# Patient Record
Sex: Male | Born: 1955
Health system: Southern US, Community
[De-identification: ages and names within clinical notes are randomized; demographics above are authoritative.]

## PROBLEM LIST (undated history)

## (undated) DIAGNOSIS — Z720 Tobacco use: Secondary | ICD-10-CM

## (undated) DIAGNOSIS — F1910 Other psychoactive substance abuse, uncomplicated: Secondary | ICD-10-CM

## (undated) DIAGNOSIS — M1990 Unspecified osteoarthritis, unspecified site: Secondary | ICD-10-CM

## (undated) DIAGNOSIS — I10 Essential (primary) hypertension: Secondary | ICD-10-CM

## (undated) DIAGNOSIS — J189 Pneumonia, unspecified organism: Secondary | ICD-10-CM

## (undated) DIAGNOSIS — I252 Old myocardial infarction: Secondary | ICD-10-CM

## (undated) DIAGNOSIS — F32A Depression, unspecified: Secondary | ICD-10-CM

## (undated) DIAGNOSIS — E119 Type 2 diabetes mellitus without complications: Secondary | ICD-10-CM

## (undated) DIAGNOSIS — R079 Chest pain, unspecified: Secondary | ICD-10-CM

## (undated) DIAGNOSIS — R7303 Prediabetes: Secondary | ICD-10-CM

## (undated) DIAGNOSIS — F329 Major depressive disorder, single episode, unspecified: Secondary | ICD-10-CM

## (undated) DIAGNOSIS — I639 Cerebral infarction, unspecified: Secondary | ICD-10-CM

## (undated) DIAGNOSIS — F1010 Alcohol abuse, uncomplicated: Secondary | ICD-10-CM

## (undated) DIAGNOSIS — J449 Chronic obstructive pulmonary disease, unspecified: Secondary | ICD-10-CM

## (undated) DIAGNOSIS — I82409 Acute embolism and thrombosis of unspecified deep veins of unspecified lower extremity: Secondary | ICD-10-CM

## (undated) DIAGNOSIS — K219 Gastro-esophageal reflux disease without esophagitis: Secondary | ICD-10-CM

## (undated) HISTORY — DX: Acute embolism and thrombosis of unspecified deep veins of unspecified lower extremity: I82.409

## (undated) HISTORY — PX: GANGLION CYST EXCISION: SHX1691

## (undated) HISTORY — DX: Gastro-esophageal reflux disease without esophagitis: K21.9

## (undated) HISTORY — DX: Major depressive disorder, single episode, unspecified: F32.9

## (undated) HISTORY — DX: Chronic obstructive pulmonary disease, unspecified: J44.9

## (undated) HISTORY — DX: Depression, unspecified: F32.A

## (undated) HISTORY — DX: Chest pain, unspecified: R07.9

## (undated) HISTORY — DX: Unspecified osteoarthritis, unspecified site: M19.90

## (undated) HISTORY — PX: WISDOM TOOTH EXTRACTION: SHX21

## (undated) HISTORY — PX: BACK SURGERY: SHX140

## (undated) HISTORY — DX: Other psychoactive substance abuse, uncomplicated: F19.10

## (undated) HISTORY — DX: Essential (primary) hypertension: I10

---

## 1998-10-26 HISTORY — PX: GANGLION CYST EXCISION: SHX1691

## 2004-09-12 ENCOUNTER — Emergency Department: Payer: Self-pay | Admitting: Emergency Medicine

## 2005-03-03 ENCOUNTER — Emergency Department: Payer: Self-pay | Admitting: Emergency Medicine

## 2005-03-12 ENCOUNTER — Emergency Department: Payer: Self-pay | Admitting: Unknown Physician Specialty

## 2005-11-30 ENCOUNTER — Inpatient Hospital Stay: Payer: Self-pay | Admitting: Internal Medicine

## 2005-11-30 ENCOUNTER — Other Ambulatory Visit: Payer: Self-pay

## 2005-12-01 ENCOUNTER — Other Ambulatory Visit: Payer: Self-pay

## 2006-03-01 ENCOUNTER — Emergency Department: Payer: Self-pay | Admitting: Emergency Medicine

## 2006-04-28 ENCOUNTER — Emergency Department: Payer: Self-pay | Admitting: Emergency Medicine

## 2006-05-03 ENCOUNTER — Emergency Department: Payer: Self-pay | Admitting: Emergency Medicine

## 2006-05-05 ENCOUNTER — Emergency Department: Payer: Self-pay | Admitting: Emergency Medicine

## 2006-05-12 ENCOUNTER — Ambulatory Visit: Payer: Self-pay | Admitting: Unknown Physician Specialty

## 2006-05-12 ENCOUNTER — Emergency Department: Payer: Self-pay | Admitting: Emergency Medicine

## 2006-10-26 DIAGNOSIS — I82409 Acute embolism and thrombosis of unspecified deep veins of unspecified lower extremity: Secondary | ICD-10-CM

## 2006-10-26 HISTORY — DX: Acute embolism and thrombosis of unspecified deep veins of unspecified lower extremity: I82.409

## 2006-11-10 ENCOUNTER — Inpatient Hospital Stay: Payer: Self-pay | Admitting: Internal Medicine

## 2006-11-10 ENCOUNTER — Other Ambulatory Visit: Payer: Self-pay

## 2006-12-01 ENCOUNTER — Emergency Department: Payer: Self-pay | Admitting: Emergency Medicine

## 2007-01-27 ENCOUNTER — Emergency Department: Payer: Self-pay | Admitting: Internal Medicine

## 2007-01-30 ENCOUNTER — Inpatient Hospital Stay: Payer: Self-pay | Admitting: Internal Medicine

## 2007-02-06 ENCOUNTER — Emergency Department: Payer: Self-pay | Admitting: Emergency Medicine

## 2007-03-25 ENCOUNTER — Encounter: Admission: RE | Admit: 2007-03-25 | Discharge: 2007-03-25 | Payer: Self-pay | Admitting: Neurosurgery

## 2007-05-27 ENCOUNTER — Emergency Department: Payer: Self-pay | Admitting: Emergency Medicine

## 2007-06-27 HISTORY — PX: LUMBAR DISC SURGERY: SHX700

## 2007-06-30 ENCOUNTER — Ambulatory Visit (HOSPITAL_COMMUNITY): Admission: RE | Admit: 2007-06-30 | Discharge: 2007-07-01 | Payer: Self-pay | Admitting: Neurosurgery

## 2008-01-17 ENCOUNTER — Ambulatory Visit: Payer: Self-pay | Admitting: Family Medicine

## 2008-03-02 ENCOUNTER — Other Ambulatory Visit: Payer: Self-pay

## 2008-03-02 ENCOUNTER — Emergency Department: Payer: Self-pay | Admitting: Emergency Medicine

## 2008-05-21 ENCOUNTER — Ambulatory Visit: Payer: Self-pay | Admitting: Family Medicine

## 2008-09-28 ENCOUNTER — Emergency Department: Payer: Self-pay | Admitting: Emergency Medicine

## 2009-09-25 ENCOUNTER — Ambulatory Visit: Payer: Self-pay | Admitting: Oncology

## 2009-09-26 ENCOUNTER — Emergency Department: Payer: Self-pay | Admitting: Internal Medicine

## 2009-11-26 ENCOUNTER — Ambulatory Visit: Payer: Self-pay | Admitting: Oncology

## 2010-02-05 ENCOUNTER — Ambulatory Visit: Payer: Self-pay | Admitting: Podiatry

## 2010-02-10 ENCOUNTER — Ambulatory Visit: Payer: Self-pay | Admitting: Cardiology

## 2010-06-02 ENCOUNTER — Ambulatory Visit: Payer: Self-pay | Admitting: Family Medicine

## 2010-07-09 ENCOUNTER — Encounter: Payer: Self-pay | Admitting: Family Medicine

## 2010-07-26 ENCOUNTER — Encounter: Payer: Self-pay | Admitting: Family Medicine

## 2010-08-20 ENCOUNTER — Ambulatory Visit: Payer: Self-pay | Admitting: Pain Medicine

## 2010-08-27 ENCOUNTER — Inpatient Hospital Stay: Payer: Self-pay | Admitting: *Deleted

## 2010-09-27 ENCOUNTER — Emergency Department: Payer: Self-pay | Admitting: Emergency Medicine

## 2010-11-21 ENCOUNTER — Emergency Department: Payer: Self-pay | Admitting: Emergency Medicine

## 2010-12-04 ENCOUNTER — Emergency Department: Payer: Self-pay | Admitting: Emergency Medicine

## 2011-01-22 ENCOUNTER — Ambulatory Visit: Payer: Self-pay | Admitting: Pain Medicine

## 2011-01-24 ENCOUNTER — Inpatient Hospital Stay: Payer: Self-pay | Admitting: Internal Medicine

## 2011-03-10 NOTE — Op Note (Signed)
NAME:  Brent Byrd, Brent Byrd                ACCOUNT NO.:  [id number]   MEDICAL RECORD NO.:  [id number]          PATIENT TYPE:  AMB   LOCATION:  SDS                          FACILITY:  [facility]   PHYSICIAN:  Reinaldo Meeker, M.D. DATE OF BIRTH:  11/02/1955   DATE OF PROCEDURE:  06/30/2007  DATE OF DISCHARGE:                               OPERATIVE REPORT   PREOPERATIVE DIAGNOSIS:  Herniated disk L4-5, L5-S1 right.   POSTOPERATIVE DIAGNOSIS:  Herniated disk L4-5, L5-S1 right.   PROCEDURE:  Right L4-5, L5-S1 interlaminar laminotomy for excision of  herniated disk with operative microscope.   SECONDARY PROCEDURE:  Microsection L4-5 disk and L5-S1 disk and L5-S1  nerve roots.   SURGEON:  Reinaldo Meeker, M.D.   ASSISTANT:  Dr. Marikay Alar.   PROCEDURE IN DETAIL:  After being placed in the prone position, the  patient's back was prepped and draped in the usual sterile fashion.  Localizing x-rays taken prior to incision to identify the appropriate  level.  Midline incision was made over the spinous processes of L4-5,  S1.  Using Bovie cutting current incision was carried down spinous  processes.  Subperiosteal dissection was then carried out on the right-  side of the spinous processes and lamina and self-retaining retractor  was placed for exposure.  X-ray showed approach to the appropriate  level.  Starting at L4-5, a high-speed drill was used on the right side  to remove the inferior one third of the L4 lamina, medial third of facet  joint and the superior one third of the L5 lamina.  Residual bone  ligamentum flavum removed in a piecemeal fashion.  Similar decompression  was then carried out at L5-S1 once again removing the inferior one third  of the L5-9 lamina, the medial third of the facet joint and the superior  one third of the S1 lamina.  Residual bone and ligamentum flavum removed  at this level.  At this time, the microscope was draped brought into the  field and used for the  remainder of the case.  Starting at L5-S1  microsection technique was used to identify the S1 nerve root and L5-S1  disk.  Disk was incised with a 15 blade and thoroughly cleaned out with  pituitary rongeurs and curettes.  At this time, inspection was carried  out for evidence of residual compression at this level and none could be  identified.  Attention was turned to L4-5.  Once again microsection  technique was used to identify the lateral aspect of thecal sac, the S1  nerve root and the L4-5 disk which was once again found to be simply  herniated.  After coagulating on the annulus, the annulus was once again  incised with 15 blade.  Using pituitary rongeurs and curettes once again  the disk space was thoroughly cleaned out, while at the same time, great  care was taken to avoid injury to the neural elements.  This was  successfully done.  At this time, inspection was carried out in all  directions for any evidence of residual compression of both levels and  none could be identified.  Large amounts of irrigation were carried out.  Any  bleeding controlled with bipolar coagulation.  The wound was then closed  in multiple layers of Vicryl in the muscle fascia, subcutaneous,  subcuticular tissues and staples were placed on the skin.  A sterile  dressings was then applied and the patient was extubated and taken to  the recovery room in stable condition.           ______________________________  Reinaldo Meeker, M.D.     ROK/MEDQ  D:  06/30/2007  T:  06/30/2007  Job:  [id number]

## 2011-06-19 ENCOUNTER — Inpatient Hospital Stay: Payer: Self-pay | Admitting: Internal Medicine

## 2011-08-07 LAB — BASIC METABOLIC PANEL
BUN: 10
Chloride: 104
GFR calc non Af Amer: >60
Glucose, Bld: 79
Potassium: 3.6
Sodium: 136

## 2011-08-07 LAB — CBC
HCT: 39.8
Hemoglobin: 13.3
MCV: 94.1
Platelets: 207
RDW: 14
WBC: 5.3

## 2011-08-07 LAB — APTT: aPTT: 55 — ABNORMAL HIGH

## 2011-12-01 ENCOUNTER — Emergency Department: Payer: Self-pay | Admitting: Emergency Medicine

## 2012-01-25 DIAGNOSIS — I639 Cerebral infarction, unspecified: Secondary | ICD-10-CM

## 2012-01-25 HISTORY — DX: Cerebral infarction, unspecified: I63.9

## 2012-02-17 LAB — BASIC METABOLIC PANEL
Anion Gap: 11 (ref 7–16)
BUN: 12 mg/dL (ref 7–18)
Creatinine: 1.06 mg/dL (ref 0.60–1.30)
EGFR (African American): >60
Glucose: 100 mg/dL — ABNORMAL HIGH (ref 65–99)

## 2012-02-17 LAB — CBC
HCT: 42.1 % (ref 40.0–52.0)
MCH: 31.9 pg (ref 26.0–34.0)
Platelet: 161 10*3/uL (ref 150–440)
RBC: 4.39 10*6/uL — ABNORMAL LOW (ref 4.40–5.90)

## 2012-02-18 ENCOUNTER — Inpatient Hospital Stay: Payer: Self-pay | Admitting: Internal Medicine

## 2012-02-18 LAB — LIPID PANEL
Cholesterol: 164 mg/dL (ref 0–200)
HDL Cholesterol: 79 mg/dL — ABNORMAL HIGH (ref 40–60)
Triglycerides: 78 mg/dL (ref 0–200)
VLDL Cholesterol, Calc: 16 mg/dL (ref 5–40)

## 2012-02-18 LAB — TROPONIN I: Troponin-I: <0.02 ng/mL

## 2012-07-11 ENCOUNTER — Emergency Department: Payer: Self-pay | Admitting: Emergency Medicine

## 2012-07-26 HISTORY — PX: HAMMER TOE SURGERY: SHX385

## 2012-08-01 ENCOUNTER — Ambulatory Visit: Payer: Self-pay | Admitting: Podiatry

## 2012-08-01 LAB — CBC WITH DIFFERENTIAL/PLATELET
Basophil %: 1 %
Eosinophil #: 0.2 10*3/uL (ref 0.0–0.7)
HCT: 42.2 % (ref 40.0–52.0)
HGB: 14.1 g/dL (ref 13.0–18.0)
MCH: 31.9 pg (ref 26.0–34.0)
MCHC: 33.4 g/dL (ref 32.0–36.0)
MCV: 96 fL (ref 80–100)
Monocyte #: 0.4 x10 3/mm (ref 0.2–1.0)
Neutrophil #: 2.2 10*3/uL (ref 1.4–6.5)

## 2012-08-05 ENCOUNTER — Ambulatory Visit: Payer: Self-pay | Admitting: Podiatry

## 2012-08-17 ENCOUNTER — Other Ambulatory Visit: Payer: Self-pay | Admitting: Pain Medicine

## 2012-08-17 ENCOUNTER — Ambulatory Visit: Payer: Self-pay | Admitting: Pain Medicine

## 2012-08-17 LAB — CBC WITH DIFFERENTIAL/PLATELET
Basophil #: 0.1 10*3/uL (ref 0.0–0.1)
Eosinophil #: 0.2 10*3/uL (ref 0.0–0.7)
HCT: 40.1 % (ref 40.0–52.0)
HGB: 13.7 g/dL (ref 13.0–18.0)
Lymphocyte %: 43.6 %
MCHC: 34.1 g/dL (ref 32.0–36.0)
Monocyte %: 11.1 %
Neutrophil #: 1.4 10*3/uL (ref 1.4–6.5)
Neutrophil %: 36.8 %
RDW: 13.9 % (ref 11.5–14.5)
WBC: 3.7 10*3/uL — ABNORMAL LOW (ref 3.8–10.6)

## 2012-08-19 ENCOUNTER — Ambulatory Visit: Payer: Self-pay | Admitting: Pain Medicine

## 2012-10-26 DIAGNOSIS — K219 Gastro-esophageal reflux disease without esophagitis: Secondary | ICD-10-CM

## 2012-10-26 HISTORY — DX: Gastro-esophageal reflux disease without esophagitis: K21.9

## 2012-12-26 ENCOUNTER — Emergency Department: Payer: Self-pay | Admitting: Emergency Medicine

## 2013-01-03 ENCOUNTER — Emergency Department: Payer: Self-pay | Admitting: Emergency Medicine

## 2013-01-03 LAB — URINALYSIS, COMPLETE
Bacteria: NONE SEEN
Bilirubin,UR: NEGATIVE
Glucose,UR: NEGATIVE mg/dL (ref 0–75)
Nitrite: NEGATIVE
Protein: NEGATIVE
Specific Gravity: 1.025 (ref 1.003–1.030)
Squamous Epithelial: <1

## 2013-01-03 LAB — COMPREHENSIVE METABOLIC PANEL
Anion Gap: 3 — ABNORMAL LOW (ref 7–16)
Co2: 27 mmol/L (ref 21–32)
EGFR (Non-African Amer.): >60
SGOT(AST): 32 U/L (ref 15–37)
SGPT (ALT): 48 U/L (ref 12–78)
Sodium: 139 mmol/L (ref 136–145)
Total Protein: 8 g/dL (ref 6.4–8.2)

## 2013-01-03 LAB — CBC
HCT: 44.3 % (ref 40.0–52.0)
MCHC: 32.7 g/dL (ref 32.0–36.0)
MCV: 95 fL (ref 80–100)
WBC: 4.8 10*3/uL (ref 3.8–10.6)

## 2013-02-24 ENCOUNTER — Inpatient Hospital Stay: Payer: Self-pay | Admitting: Internal Medicine

## 2013-02-24 LAB — TROPONIN I: Troponin-I: <0.02 ng/mL

## 2013-02-24 LAB — COMPREHENSIVE METABOLIC PANEL
Albumin: 4 g/dL (ref 3.4–5.0)
Alkaline Phosphatase: 98 U/L (ref 50–136)
BUN: 6 mg/dL — ABNORMAL LOW (ref 7–18)
Bilirubin,Total: 0.5 mg/dL (ref 0.2–1.0)
Creatinine: 0.75 mg/dL (ref 0.60–1.30)
EGFR (African American): >60
EGFR (Non-African Amer.): >60
Potassium: 3.6 mmol/L (ref 3.5–5.1)
SGOT(AST): 29 U/L (ref 15–37)

## 2013-02-24 LAB — CBC
HCT: 44.1 % (ref 40.0–52.0)
HGB: 14.5 g/dL (ref 13.0–18.0)
MCHC: 32.9 g/dL (ref 32.0–36.0)
Platelet: 215 10*3/uL (ref 150–440)
RBC: 4.73 10*6/uL (ref 4.40–5.90)
WBC: 8.7 10*3/uL (ref 3.8–10.6)

## 2013-02-24 LAB — PROTIME-INR
INR: 1
Prothrombin Time: 13.3 secs (ref 11.5–14.7)

## 2013-02-24 LAB — DRUG SCREEN, URINE
Amphetamines, Ur Screen: NEGATIVE (ref ?–1000)
Benzodiazepine, Ur Scrn: NEGATIVE (ref ?–200)
Cannabinoid 50 Ng, Ur ~~LOC~~: NEGATIVE (ref ?–50)
Cocaine Metabolite,Ur ~~LOC~~: POSITIVE (ref ?–300)
MDMA (Ecstasy)Ur Screen: NEGATIVE (ref ?–500)
Opiate, Ur Screen: NEGATIVE (ref ?–300)
Phencyclidine (PCP) Ur S: NEGATIVE (ref ?–25)

## 2013-02-24 LAB — ETHANOL: Ethanol: 137 mg/dL

## 2013-02-25 LAB — CBC WITH DIFFERENTIAL/PLATELET
Basophil %: 0.7 %
Eosinophil #: 0.1 10*3/uL (ref 0.0–0.7)
Eosinophil %: 1.9 %
HCT: 40.6 % (ref 40.0–52.0)
HGB: 13.7 g/dL (ref 13.0–18.0)
MCH: 31.2 pg (ref 26.0–34.0)
MCV: 93 fL (ref 80–100)
Monocyte #: 0.7 x10 3/mm (ref 0.2–1.0)
Monocyte %: 9.2 %
Neutrophil #: 4.8 10*3/uL (ref 1.4–6.5)
Neutrophil %: 66.2 %

## 2013-02-25 LAB — BASIC METABOLIC PANEL
Anion Gap: 4 — ABNORMAL LOW (ref 7–16)
BUN: 9 mg/dL (ref 7–18)
Co2: 24 mmol/L (ref 21–32)
Creatinine: 0.98 mg/dL (ref 0.60–1.30)
Osmolality: 270 (ref 275–301)
Potassium: 3.9 mmol/L (ref 3.5–5.1)
Sodium: 136 mmol/L (ref 136–145)

## 2013-02-25 LAB — CK TOTAL AND CKMB (NOT AT ARMC)
CK, Total: 394 U/L — ABNORMAL HIGH (ref 35–232)
CK-MB: 2.2 ng/mL (ref 0.5–3.6)

## 2013-02-26 DIAGNOSIS — R0602 Shortness of breath: Secondary | ICD-10-CM

## 2013-03-02 LAB — CULTURE, BLOOD (SINGLE)

## 2013-05-31 ENCOUNTER — Inpatient Hospital Stay: Payer: Self-pay | Admitting: Psychiatry

## 2013-05-31 LAB — URINALYSIS, COMPLETE
Bacteria: NONE SEEN
Bilirubin,UR: NEGATIVE
Ketone: NEGATIVE
Nitrite: NEGATIVE
Ph: 5 (ref 4.5–8.0)
Protein: NEGATIVE
Specific Gravity: 1.011 (ref 1.003–1.030)
Squamous Epithelial: 5
WBC UR: 12 /HPF (ref 0–5)

## 2013-05-31 LAB — COMPREHENSIVE METABOLIC PANEL
Anion Gap: 6 — ABNORMAL LOW (ref 7–16)
Calcium, Total: 8.7 mg/dL (ref 8.5–10.1)
Chloride: 109 mmol/L — ABNORMAL HIGH (ref 98–107)
Co2: 25 mmol/L (ref 21–32)
Creatinine: 0.99 mg/dL (ref 0.60–1.30)
EGFR (African American): >60
Glucose: 77 mg/dL (ref 65–99)
Osmolality: 276 (ref 275–301)
Potassium: 4.1 mmol/L (ref 3.5–5.1)
SGOT(AST): 46 U/L — ABNORMAL HIGH (ref 15–37)
SGPT (ALT): 54 U/L (ref 12–78)
Sodium: 140 mmol/L (ref 136–145)

## 2013-05-31 LAB — DRUG SCREEN, URINE
Amphetamines, Ur Screen: NEGATIVE (ref ?–1000)
Benzodiazepine, Ur Scrn: NEGATIVE (ref ?–200)
Cannabinoid 50 Ng, Ur ~~LOC~~: NEGATIVE (ref ?–50)
Cocaine Metabolite,Ur ~~LOC~~: POSITIVE (ref ?–300)
MDMA (Ecstasy)Ur Screen: NEGATIVE (ref ?–500)
Methadone, Ur Screen: NEGATIVE (ref ?–300)
Phencyclidine (PCP) Ur S: NEGATIVE (ref ?–25)
Tricyclic, Ur Screen: NEGATIVE (ref ?–1000)

## 2013-05-31 LAB — CBC
HGB: 14.2 g/dL (ref 13.0–18.0)
MCH: 32.6 pg (ref 26.0–34.0)
MCHC: 33.8 g/dL (ref 32.0–36.0)
RBC: 4.37 10*6/uL — ABNORMAL LOW (ref 4.40–5.90)
RDW: 16.2 % — ABNORMAL HIGH (ref 11.5–14.5)
WBC: 4 10*3/uL (ref 3.8–10.6)

## 2013-05-31 LAB — TSH: Thyroid Stimulating Horm: 0.798 u[IU]/mL

## 2013-07-29 ENCOUNTER — Inpatient Hospital Stay: Payer: Self-pay | Admitting: Internal Medicine

## 2013-07-29 LAB — TROPONIN I
Troponin-I: <0.02 ng/mL
Troponin-I: <0.02 ng/mL

## 2013-07-29 LAB — CBC
MCH: 33 pg (ref 26.0–34.0)
MCHC: 34.4 g/dL (ref 32.0–36.0)
Platelet: 125 10*3/uL — ABNORMAL LOW (ref 150–440)
RBC: 4.22 10*6/uL — ABNORMAL LOW (ref 4.40–5.90)

## 2013-07-29 LAB — CK TOTAL AND CKMB (NOT AT ARMC)
CK, Total: 302 U/L — ABNORMAL HIGH (ref 35–232)
CK-MB: 1.9 ng/mL (ref 0.5–3.6)

## 2013-07-29 LAB — BASIC METABOLIC PANEL
Anion Gap: 10 (ref 7–16)
Calcium, Total: 9 mg/dL (ref 8.5–10.1)
Chloride: 106 mmol/L (ref 98–107)
Co2: 24 mmol/L (ref 21–32)
Creatinine: 0.84 mg/dL (ref 0.60–1.30)
EGFR (African American): >60
EGFR (Non-African Amer.): >60
Osmolality: 276 (ref 275–301)
Potassium: 4 mmol/L (ref 3.5–5.1)

## 2013-07-29 LAB — PROTIME-INR: INR: 1

## 2013-07-30 LAB — CBC WITH DIFFERENTIAL/PLATELET
Basophil #: 0 10*3/uL (ref 0.0–0.1)
Basophil %: 1.1 %
Eosinophil %: 3.7 %
HGB: 13.2 g/dL (ref 13.0–18.0)
Lymphocyte #: 2 10*3/uL (ref 1.0–3.6)
Lymphocyte %: 46 %
MCHC: 34.2 g/dL (ref 32.0–36.0)
MCV: 96 fL (ref 80–100)
Neutrophil #: 1.7 10*3/uL (ref 1.4–6.5)
Neutrophil %: 38 %
RBC: 4.04 10*6/uL — ABNORMAL LOW (ref 4.40–5.90)
RDW: 14.7 % — ABNORMAL HIGH (ref 11.5–14.5)
WBC: 4.4 10*3/uL (ref 3.8–10.6)

## 2013-07-30 LAB — BASIC METABOLIC PANEL
Co2: 27 mmol/L (ref 21–32)
Creatinine: 1.09 mg/dL (ref 0.60–1.30)
EGFR (African American): >60
Glucose: 90 mg/dL (ref 65–99)
Osmolality: 275 (ref 275–301)
Potassium: 3.8 mmol/L (ref 3.5–5.1)

## 2013-07-30 LAB — TROPONIN I: Troponin-I: <0.02 ng/mL

## 2013-08-01 LAB — URIC ACID: Uric Acid: 5 mg/dL (ref 3.5–7.2)

## 2013-08-15 ENCOUNTER — Other Ambulatory Visit: Payer: Self-pay | Admitting: *Deleted

## 2013-08-15 DIAGNOSIS — I8002 Phlebitis and thrombophlebitis of superficial vessels of left lower extremity: Secondary | ICD-10-CM

## 2013-08-15 DIAGNOSIS — I80299 Phlebitis and thrombophlebitis of other deep vessels of unspecified lower extremity: Secondary | ICD-10-CM

## 2013-08-25 ENCOUNTER — Encounter: Payer: Self-pay | Admitting: Vascular Surgery

## 2013-08-28 ENCOUNTER — Encounter: Payer: Self-pay | Admitting: Vascular Surgery

## 2013-09-07 ENCOUNTER — Encounter: Payer: Self-pay | Admitting: Vascular Surgery

## 2013-09-07 ENCOUNTER — Other Ambulatory Visit (HOSPITAL_COMMUNITY): Payer: Self-pay

## 2013-09-28 ENCOUNTER — Encounter (HOSPITAL_COMMUNITY): Payer: Self-pay

## 2013-09-28 ENCOUNTER — Encounter: Payer: Self-pay | Admitting: Vascular Surgery

## 2014-02-16 ENCOUNTER — Observation Stay: Payer: Self-pay | Admitting: Internal Medicine

## 2014-02-16 LAB — CBC
HCT: 42 % (ref 40.0–52.0)
HGB: 14.1 g/dL (ref 13.0–18.0)
MCH: 31.4 pg (ref 26.0–34.0)
MCHC: 33.5 g/dL (ref 32.0–36.0)
MCV: 94 fL (ref 80–100)
Platelet: 168 10*3/uL (ref 150–440)
RBC: 4.48 10*6/uL (ref 4.40–5.90)
RDW: 16.2 % — ABNORMAL HIGH (ref 11.5–14.5)
WBC: 3.4 10*3/uL — ABNORMAL LOW (ref 3.8–10.6)

## 2014-02-16 LAB — COMPREHENSIVE METABOLIC PANEL
AST: 29 U/L (ref 15–37)
Albumin: 3.6 g/dL (ref 3.4–5.0)
Alkaline Phosphatase: 82 U/L
Anion Gap: 9 (ref 7–16)
BUN: 8 mg/dL (ref 7–18)
Bilirubin,Total: 0.4 mg/dL (ref 0.2–1.0)
CALCIUM: 8.6 mg/dL (ref 8.5–10.1)
CREATININE: 0.95 mg/dL (ref 0.60–1.30)
Chloride: 110 mmol/L — ABNORMAL HIGH (ref 98–107)
Co2: 24 mmol/L (ref 21–32)
EGFR (African American): >60
Glucose: 73 mg/dL (ref 65–99)
OSMOLALITY: 282 (ref 275–301)
Potassium: 3.3 mmol/L — ABNORMAL LOW (ref 3.5–5.1)
SGPT (ALT): 22 U/L (ref 12–78)
Sodium: 143 mmol/L (ref 136–145)
Total Protein: 7.7 g/dL (ref 6.4–8.2)

## 2014-02-16 LAB — LIPID PANEL
CHOLESTEROL: 161 mg/dL (ref 0–200)
HDL: 83 mg/dL — AB (ref 40–60)
LDL CHOLESTEROL, CALC: 61 mg/dL (ref 0–100)
TRIGLYCERIDES: 83 mg/dL (ref 0–200)
VLDL Cholesterol, Calc: 17 mg/dL (ref 5–40)

## 2014-02-16 LAB — TROPONIN I
Troponin-I: <0.02 ng/mL
Troponin-I: <0.02 ng/mL

## 2014-02-16 LAB — CK TOTAL AND CKMB (NOT AT ARMC)
CK, TOTAL: 428 U/L — AB
CK, Total: 574 U/L — ABNORMAL HIGH
CK, Total: 480 U/L — ABNORMAL HIGH
CK-MB: 4.5 ng/mL — AB (ref 0.5–3.6)
CK-MB: 3.7 ng/mL — AB (ref 0.5–3.6)
CK-MB: 3 ng/mL (ref 0.5–3.6)

## 2014-02-17 LAB — TSH: THYROID STIMULATING HORM: 1.18 u[IU]/mL

## 2014-02-25 ENCOUNTER — Emergency Department: Payer: Self-pay | Admitting: Emergency Medicine

## 2014-02-25 LAB — CBC
HCT: 41.5 % (ref 40.0–52.0)
HGB: 13.7 g/dL (ref 13.0–18.0)
MCH: 31.3 pg (ref 26.0–34.0)
MCHC: 33 g/dL (ref 32.0–36.0)
MCV: 95 fL (ref 80–100)
PLATELETS: 169 10*3/uL (ref 150–440)
RBC: 4.38 10*6/uL — AB (ref 4.40–5.90)
RDW: 16.9 % — ABNORMAL HIGH (ref 11.5–14.5)
WBC: 3.8 10*3/uL (ref 3.8–10.6)

## 2014-02-25 LAB — BASIC METABOLIC PANEL
ANION GAP: 11 (ref 7–16)
BUN: 15 mg/dL (ref 7–18)
CO2: 21 mmol/L (ref 21–32)
Calcium, Total: 9.2 mg/dL (ref 8.5–10.1)
Chloride: 107 mmol/L (ref 98–107)
Creatinine: 1.1 mg/dL (ref 0.60–1.30)
EGFR (African American): >60
Glucose: 126 mg/dL — ABNORMAL HIGH (ref 65–99)
Osmolality: 280 (ref 275–301)
Potassium: 3.5 mmol/L (ref 3.5–5.1)
SODIUM: 139 mmol/L (ref 136–145)

## 2014-02-25 LAB — PRO B NATRIURETIC PEPTIDE: B-Type Natriuretic Peptide: 43 pg/mL (ref 0–125)

## 2014-02-25 LAB — TROPONIN I: Troponin-I: <0.02 ng/mL

## 2014-03-26 ENCOUNTER — Observation Stay: Payer: Self-pay | Admitting: Internal Medicine

## 2014-03-26 LAB — COMPREHENSIVE METABOLIC PANEL
ALT: 23 U/L (ref 12–78)
Albumin: 3.8 g/dL (ref 3.4–5.0)
Alkaline Phosphatase: 78 U/L
Anion Gap: 11 (ref 7–16)
BILIRUBIN TOTAL: 0.5 mg/dL (ref 0.2–1.0)
BUN: 7 mg/dL (ref 7–18)
Calcium, Total: 8.8 mg/dL (ref 8.5–10.1)
Chloride: 105 mmol/L (ref 98–107)
Co2: 22 mmol/L (ref 21–32)
Creatinine: 0.82 mg/dL (ref 0.60–1.30)
EGFR (African American): >60
Glucose: 74 mg/dL (ref 65–99)
Osmolality: 272 (ref 275–301)
POTASSIUM: 3.8 mmol/L (ref 3.5–5.1)
SGOT(AST): 28 U/L (ref 15–37)
SODIUM: 138 mmol/L (ref 136–145)
TOTAL PROTEIN: 7.8 g/dL (ref 6.4–8.2)

## 2014-03-26 LAB — SALICYLATE LEVEL
SALICYLATES, SERUM: 5.4 mg/dL — AB
Salicylates, Serum: 5.1 mg/dL — ABNORMAL HIGH

## 2014-03-26 LAB — CBC
HCT: 44.4 % (ref 40.0–52.0)
HGB: 14.7 g/dL (ref 13.0–18.0)
MCH: 31.7 pg (ref 26.0–34.0)
MCHC: 33.1 g/dL (ref 32.0–36.0)
MCV: 96 fL (ref 80–100)
Platelet: 158 10*3/uL (ref 150–440)
RBC: 4.64 10*6/uL (ref 4.40–5.90)
RDW: 16.2 % — ABNORMAL HIGH (ref 11.5–14.5)
WBC: 4.7 10*3/uL (ref 3.8–10.6)

## 2014-03-26 LAB — PROTIME-INR
INR: 1
PROTHROMBIN TIME: 13.1 s (ref 11.5–14.7)

## 2014-03-26 LAB — ETHANOL
ETHANOL %: 0.083 % — AB (ref 0.000–0.080)
ETHANOL LVL: 83 mg/dL

## 2014-03-26 LAB — APTT: Activated PTT: 30.6 secs (ref 23.6–35.9)

## 2014-03-26 LAB — ACETAMINOPHEN LEVEL: Acetaminophen: <2 ug/mL

## 2014-03-26 LAB — TSH: Thyroid Stimulating Horm: 1.42 u[IU]/mL

## 2014-03-27 LAB — MAGNESIUM: Magnesium: 1.9 mg/dL

## 2014-03-27 LAB — COMPREHENSIVE METABOLIC PANEL
ALK PHOS: 73 U/L
AST: 31 U/L (ref 15–37)
Albumin: 3.6 g/dL (ref 3.4–5.0)
Anion Gap: 6 — ABNORMAL LOW (ref 7–16)
BUN: 11 mg/dL (ref 7–18)
Bilirubin,Total: 0.6 mg/dL (ref 0.2–1.0)
CALCIUM: 8.5 mg/dL (ref 8.5–10.1)
Chloride: 107 mmol/L (ref 98–107)
Co2: 25 mmol/L (ref 21–32)
Creatinine: 1.01 mg/dL (ref 0.60–1.30)
EGFR (Non-African Amer.): >60
GLUCOSE: 74 mg/dL (ref 65–99)
Osmolality: 274 (ref 275–301)
Potassium: 3.8 mmol/L (ref 3.5–5.1)
SGPT (ALT): 20 U/L (ref 12–78)
Sodium: 138 mmol/L (ref 136–145)
Total Protein: 7.5 g/dL (ref 6.4–8.2)

## 2014-03-27 LAB — CBC WITH DIFFERENTIAL/PLATELET
BASOS PCT: 1.4 %
Basophil #: 0.1 10*3/uL (ref 0.0–0.1)
Eosinophil #: 0.1 10*3/uL (ref 0.0–0.7)
Eosinophil %: 3.5 %
HCT: 42.9 % (ref 40.0–52.0)
HGB: 14.2 g/dL (ref 13.0–18.0)
Lymphocyte #: 1.5 10*3/uL (ref 1.0–3.6)
Lymphocyte %: 38.4 %
MCH: 31.8 pg (ref 26.0–34.0)
MCHC: 33.1 g/dL (ref 32.0–36.0)
MCV: 96 fL (ref 80–100)
MONOS PCT: 10.7 %
Monocyte #: 0.4 x10 3/mm (ref 0.2–1.0)
Neutrophil #: 1.8 10*3/uL (ref 1.4–6.5)
Neutrophil %: 46 %
PLATELETS: 147 10*3/uL — AB (ref 150–440)
RBC: 4.48 10*6/uL (ref 4.40–5.90)
RDW: 16.2 % — ABNORMAL HIGH (ref 11.5–14.5)
WBC: 4 10*3/uL (ref 3.8–10.6)

## 2014-03-27 LAB — URINALYSIS, COMPLETE
BILIRUBIN, UR: NEGATIVE
BLOOD: NEGATIVE
Bacteria: NONE SEEN
Glucose,UR: NEGATIVE mg/dL (ref 0–75)
Ketone: NEGATIVE
LEUKOCYTE ESTERASE: NEGATIVE
Nitrite: NEGATIVE
Ph: 5 (ref 4.5–8.0)
Protein: NEGATIVE
RBC,UR: NONE SEEN /HPF (ref 0–5)
Specific Gravity: 1.02 (ref 1.003–1.030)
WBC UR: NONE SEEN /HPF (ref 0–5)

## 2014-03-27 LAB — DRUG SCREEN, URINE
Amphetamines, Ur Screen: NEGATIVE (ref ?–1000)
Barbiturates, Ur Screen: NEGATIVE (ref ?–200)
Benzodiazepine, Ur Scrn: NEGATIVE (ref ?–200)
Cannabinoid 50 Ng, Ur ~~LOC~~: NEGATIVE (ref ?–50)
Cocaine Metabolite,Ur ~~LOC~~: POSITIVE (ref ?–300)
MDMA (ECSTASY) UR SCREEN: NEGATIVE (ref ?–500)
Methadone, Ur Screen: NEGATIVE (ref ?–300)
OPIATE, UR SCREEN: NEGATIVE (ref ?–300)
Phencyclidine (PCP) Ur S: NEGATIVE (ref ?–25)
Tricyclic, Ur Screen: NEGATIVE (ref ?–1000)

## 2014-03-27 LAB — OCCULT BLOOD X 1 CARD TO LAB, STOOL: OCCULT BLOOD, FECES: NEGATIVE

## 2014-03-27 LAB — PROTIME-INR
INR: 1.1
PROTHROMBIN TIME: 13.7 s (ref 11.5–14.7)

## 2014-03-27 LAB — HEMOGLOBIN: HGB: 15.1 g/dL (ref 13.0–18.0)

## 2014-03-28 ENCOUNTER — Inpatient Hospital Stay: Payer: Self-pay | Admitting: Psychiatry

## 2014-03-28 LAB — CBC WITH DIFFERENTIAL/PLATELET
BASOS ABS: 0 10*3/uL (ref 0.0–0.1)
Basophil %: 1 %
Eosinophil #: 0.2 10*3/uL (ref 0.0–0.7)
Eosinophil %: 4.2 %
HCT: 43.1 % (ref 40.0–52.0)
HGB: 14.1 g/dL (ref 13.0–18.0)
Lymphocyte #: 1.5 10*3/uL (ref 1.0–3.6)
Lymphocyte %: 36.3 %
MCH: 31.5 pg (ref 26.0–34.0)
MCHC: 32.7 g/dL (ref 32.0–36.0)
MCV: 96 fL (ref 80–100)
MONOS PCT: 12.4 %
Monocyte #: 0.5 x10 3/mm (ref 0.2–1.0)
Neutrophil #: 2 10*3/uL (ref 1.4–6.5)
Neutrophil %: 46.1 %
Platelet: 151 10*3/uL (ref 150–440)
RBC: 4.47 10*6/uL (ref 4.40–5.90)
RDW: 16.1 % — AB (ref 11.5–14.5)
WBC: 4.2 10*3/uL (ref 3.8–10.6)

## 2014-03-28 LAB — PROTIME-INR
INR: 1
PROTHROMBIN TIME: 13.3 s (ref 11.5–14.7)

## 2014-04-03 LAB — LIPID PANEL
Cholesterol: 154 mg/dL (ref 0–200)
HDL Cholesterol: 68 mg/dL — ABNORMAL HIGH (ref 40–60)
Ldl Cholesterol, Calc: 78 mg/dL (ref 0–100)
Triglycerides: 41 mg/dL (ref 0–200)
VLDL Cholesterol, Calc: 8 mg/dL (ref 5–40)

## 2014-04-30 ENCOUNTER — Emergency Department: Payer: Self-pay | Admitting: Emergency Medicine

## 2014-04-30 LAB — URINALYSIS, COMPLETE
Bacteria: NONE SEEN
Bilirubin,UR: NEGATIVE
Blood: NEGATIVE
GLUCOSE, UR: NEGATIVE mg/dL (ref 0–75)
Leukocyte Esterase: NEGATIVE
NITRITE: NEGATIVE
Ph: 5 (ref 4.5–8.0)
Protein: NEGATIVE
RBC,UR: 1 /HPF (ref 0–5)
Specific Gravity: 1.023 (ref 1.003–1.030)
Squamous Epithelial: NONE SEEN
WBC UR: 1 /HPF (ref 0–5)

## 2014-04-30 LAB — CBC
HCT: 42.4 % (ref 40.0–52.0)
HGB: 13.9 g/dL (ref 13.0–18.0)
MCH: 31.5 pg (ref 26.0–34.0)
MCHC: 32.7 g/dL (ref 32.0–36.0)
MCV: 96 fL (ref 80–100)
Platelet: 149 10*3/uL — ABNORMAL LOW (ref 150–440)
RBC: 4.4 10*6/uL (ref 4.40–5.90)
RDW: 15.6 % — AB (ref 11.5–14.5)
WBC: 3.5 10*3/uL — ABNORMAL LOW (ref 3.8–10.6)

## 2014-04-30 LAB — BASIC METABOLIC PANEL
Anion Gap: 7 (ref 7–16)
BUN: 11 mg/dL (ref 7–18)
CALCIUM: 8.3 mg/dL — AB (ref 8.5–10.1)
CHLORIDE: 106 mmol/L (ref 98–107)
CREATININE: 1 mg/dL (ref 0.60–1.30)
Co2: 24 mmol/L (ref 21–32)
EGFR (Non-African Amer.): >60
Glucose: 100 mg/dL — ABNORMAL HIGH (ref 65–99)
OSMOLALITY: 273 (ref 275–301)
POTASSIUM: 4 mmol/L (ref 3.5–5.1)
Sodium: 137 mmol/L (ref 136–145)

## 2014-04-30 LAB — ETHANOL: Ethanol: <3 mg/dL

## 2014-04-30 LAB — DRUG SCREEN, URINE
Amphetamines, Ur Screen: NEGATIVE (ref ?–1000)
BARBITURATES, UR SCREEN: NEGATIVE (ref ?–200)
Benzodiazepine, Ur Scrn: NEGATIVE (ref ?–200)
Cannabinoid 50 Ng, Ur ~~LOC~~: NEGATIVE (ref ?–50)
Cocaine Metabolite,Ur ~~LOC~~: POSITIVE (ref ?–300)
MDMA (Ecstasy)Ur Screen: NEGATIVE (ref ?–500)
Methadone, Ur Screen: NEGATIVE (ref ?–300)
OPIATE, UR SCREEN: NEGATIVE (ref ?–300)
PHENCYCLIDINE (PCP) UR S: NEGATIVE (ref ?–25)
Tricyclic, Ur Screen: NEGATIVE (ref ?–1000)

## 2014-04-30 LAB — SALICYLATE LEVEL: Salicylates, Serum: 4.8 mg/dL — ABNORMAL HIGH

## 2014-04-30 LAB — TROPONIN I
Troponin-I: <0.02 ng/mL
Troponin-I: <0.02 ng/mL

## 2014-04-30 LAB — ACETAMINOPHEN LEVEL

## 2014-05-26 ENCOUNTER — Emergency Department: Payer: Self-pay | Admitting: Emergency Medicine

## 2014-05-26 LAB — SALICYLATE LEVEL: SALICYLATES, SERUM: 3.7 mg/dL — AB

## 2014-05-26 LAB — COMPREHENSIVE METABOLIC PANEL
ALK PHOS: 75 U/L
AST: 23 U/L (ref 15–37)
Albumin: 4.2 g/dL (ref 3.4–5.0)
Anion Gap: 8 (ref 7–16)
BILIRUBIN TOTAL: 0.4 mg/dL (ref 0.2–1.0)
BUN: 12 mg/dL (ref 7–18)
Calcium, Total: 8.7 mg/dL (ref 8.5–10.1)
Chloride: 110 mmol/L — ABNORMAL HIGH (ref 98–107)
Co2: 21 mmol/L (ref 21–32)
Creatinine: 1.09 mg/dL (ref 0.60–1.30)
EGFR (African American): >60
EGFR (Non-African Amer.): >60
Glucose: 70 mg/dL (ref 65–99)
Osmolality: 276 (ref 275–301)
Potassium: 3.9 mmol/L (ref 3.5–5.1)
SGPT (ALT): 24 U/L
Sodium: 139 mmol/L (ref 136–145)
Total Protein: 8.7 g/dL — ABNORMAL HIGH (ref 6.4–8.2)

## 2014-05-26 LAB — CBC
HCT: 43.9 % (ref 40.0–52.0)
HGB: 14.2 g/dL (ref 13.0–18.0)
MCH: 31.2 pg (ref 26.0–34.0)
MCHC: 32.3 g/dL (ref 32.0–36.0)
MCV: 97 fL (ref 80–100)
Platelet: 230 10*3/uL (ref 150–440)
RBC: 4.55 10*6/uL (ref 4.40–5.90)
RDW: 14.9 % — AB (ref 11.5–14.5)
WBC: 6.8 10*3/uL (ref 3.8–10.6)

## 2014-05-26 LAB — DRUG SCREEN, URINE
Amphetamines, Ur Screen: NEGATIVE (ref ?–1000)
Barbiturates, Ur Screen: NEGATIVE (ref ?–200)
Benzodiazepine, Ur Scrn: NEGATIVE (ref ?–200)
CANNABINOID 50 NG, UR ~~LOC~~: NEGATIVE (ref ?–50)
COCAINE METABOLITE, UR ~~LOC~~: POSITIVE (ref ?–300)
MDMA (Ecstasy)Ur Screen: NEGATIVE (ref ?–500)
Methadone, Ur Screen: NEGATIVE (ref ?–300)
Opiate, Ur Screen: NEGATIVE (ref ?–300)
Phencyclidine (PCP) Ur S: NEGATIVE (ref ?–25)
Tricyclic, Ur Screen: NEGATIVE (ref ?–1000)

## 2014-05-26 LAB — URINALYSIS, COMPLETE
BLOOD: NEGATIVE
Bacteria: NONE SEEN
Bilirubin,UR: NEGATIVE
Glucose,UR: NEGATIVE mg/dL (ref 0–75)
Hyaline Cast: 2
Ketone: NEGATIVE
LEUKOCYTE ESTERASE: NEGATIVE
Nitrite: NEGATIVE
PH: 5 (ref 4.5–8.0)
PROTEIN: NEGATIVE
RBC,UR: <1 /HPF (ref 0–5)
Specific Gravity: 1.01 (ref 1.003–1.030)
Squamous Epithelial: <1
WBC UR: 2 /HPF (ref 0–5)

## 2014-05-26 LAB — ETHANOL
ETHANOL LVL: 223 mg/dL
Ethanol %: 0.223 % — ABNORMAL HIGH (ref 0.000–0.080)

## 2014-05-26 LAB — ACETAMINOPHEN LEVEL: Acetaminophen: <2 ug/mL

## 2014-05-27 LAB — ETHANOL: Ethanol %: <0.003 % (ref 0.000–0.080)

## 2014-07-14 ENCOUNTER — Emergency Department: Payer: Self-pay | Admitting: Emergency Medicine

## 2014-07-14 LAB — ETHANOL: Ethanol: 299 mg/dL

## 2014-07-14 LAB — ACETAMINOPHEN LEVEL

## 2014-07-14 LAB — COMPREHENSIVE METABOLIC PANEL
ALBUMIN: 3.9 g/dL (ref 3.4–5.0)
ALT: 23 U/L
ANION GAP: 10 (ref 7–16)
Alkaline Phosphatase: 70 U/L
BUN: 9 mg/dL (ref 7–18)
Bilirubin,Total: 0.2 mg/dL (ref 0.2–1.0)
CREATININE: 1.05 mg/dL (ref 0.60–1.30)
Calcium, Total: 8.1 mg/dL — ABNORMAL LOW (ref 8.5–10.1)
Chloride: 111 mmol/L — ABNORMAL HIGH (ref 98–107)
Co2: 21 mmol/L (ref 21–32)
EGFR (African American): >60
EGFR (Non-African Amer.): >60
GLUCOSE: 99 mg/dL (ref 65–99)
Osmolality: 282 (ref 275–301)
POTASSIUM: 3.9 mmol/L (ref 3.5–5.1)
SGOT(AST): 37 U/L (ref 15–37)
Sodium: 142 mmol/L (ref 136–145)
TOTAL PROTEIN: 7.9 g/dL (ref 6.4–8.2)

## 2014-07-14 LAB — URINALYSIS, COMPLETE
BACTERIA: NONE SEEN
BILIRUBIN, UR: NEGATIVE
BLOOD: NEGATIVE
GLUCOSE, UR: NEGATIVE mg/dL (ref 0–75)
Ketone: NEGATIVE
LEUKOCYTE ESTERASE: NEGATIVE
Nitrite: NEGATIVE
Ph: 5 (ref 4.5–8.0)
Protein: NEGATIVE
RBC, UR: NONE SEEN /HPF (ref 0–5)
SPECIFIC GRAVITY: 1.004 (ref 1.003–1.030)
Squamous Epithelial: <1

## 2014-07-14 LAB — CBC
HCT: 43 % (ref 40.0–52.0)
HGB: 14.1 g/dL (ref 13.0–18.0)
MCH: 32.1 pg (ref 26.0–34.0)
MCHC: 32.8 g/dL (ref 32.0–36.0)
MCV: 98 fL (ref 80–100)
Platelet: 195 10*3/uL (ref 150–440)
RBC: 4.39 10*6/uL — ABNORMAL LOW (ref 4.40–5.90)
RDW: 15.4 % — ABNORMAL HIGH (ref 11.5–14.5)
WBC: 6.1 10*3/uL (ref 3.8–10.6)

## 2014-07-14 LAB — DRUG SCREEN, URINE
Amphetamines, Ur Screen: NEGATIVE (ref ?–1000)
BENZODIAZEPINE, UR SCRN: NEGATIVE (ref ?–200)
Barbiturates, Ur Screen: NEGATIVE (ref ?–200)
COCAINE METABOLITE, UR ~~LOC~~: POSITIVE (ref ?–300)
Cannabinoid 50 Ng, Ur ~~LOC~~: NEGATIVE (ref ?–50)
MDMA (Ecstasy)Ur Screen: NEGATIVE (ref ?–500)
METHADONE, UR SCREEN: NEGATIVE (ref ?–300)
Opiate, Ur Screen: NEGATIVE (ref ?–300)
PHENCYCLIDINE (PCP) UR S: NEGATIVE (ref ?–25)
Tricyclic, Ur Screen: NEGATIVE (ref ?–1000)

## 2014-07-14 LAB — SALICYLATE LEVEL: Salicylates, Serum: 5.3 mg/dL — ABNORMAL HIGH

## 2014-07-15 LAB — ETHANOL: Ethanol: <3 mg/dL

## 2014-07-16 ENCOUNTER — Emergency Department: Payer: Self-pay | Admitting: Emergency Medicine

## 2014-07-16 LAB — CBC WITH DIFFERENTIAL/PLATELET
BASOS ABS: 0.1 10*3/uL (ref 0.0–0.1)
BASOS PCT: 1.3 %
Eosinophil #: 0.2 10*3/uL (ref 0.0–0.7)
Eosinophil %: 4.2 %
HCT: 44.5 % (ref 40.0–52.0)
HGB: 14.4 g/dL (ref 13.0–18.0)
LYMPHS PCT: 35.4 %
Lymphocyte #: 1.4 10*3/uL (ref 1.0–3.6)
MCH: 31.6 pg (ref 26.0–34.0)
MCHC: 32.3 g/dL (ref 32.0–36.0)
MCV: 98 fL (ref 80–100)
Monocyte #: 0.4 x10 3/mm (ref 0.2–1.0)
Monocyte %: 10 %
NEUTROS ABS: 1.9 10*3/uL (ref 1.4–6.5)
Neutrophil %: 49.1 %
Platelet: 181 10*3/uL (ref 150–440)
RBC: 4.54 10*6/uL (ref 4.40–5.90)
RDW: 15.2 % — ABNORMAL HIGH (ref 11.5–14.5)
WBC: 3.9 10*3/uL (ref 3.8–10.6)

## 2014-07-16 LAB — TROPONIN I

## 2014-07-29 ENCOUNTER — Emergency Department: Payer: Self-pay | Admitting: Emergency Medicine

## 2014-07-29 LAB — COMPREHENSIVE METABOLIC PANEL
ALT: 26 U/L
ANION GAP: 9 (ref 7–16)
Albumin: 4.1 g/dL (ref 3.4–5.0)
Alkaline Phosphatase: 74 U/L
BUN: 10 mg/dL (ref 7–18)
Bilirubin,Total: 0.5 mg/dL (ref 0.2–1.0)
CHLORIDE: 108 mmol/L — AB (ref 98–107)
CO2: 21 mmol/L (ref 21–32)
Calcium, Total: 8.7 mg/dL (ref 8.5–10.1)
Creatinine: 0.92 mg/dL (ref 0.60–1.30)
EGFR (African American): >60
GLUCOSE: 76 mg/dL (ref 65–99)
OSMOLALITY: 273 (ref 275–301)
Potassium: 3.9 mmol/L (ref 3.5–5.1)
SGOT(AST): 16 U/L (ref 15–37)
Sodium: 138 mmol/L (ref 136–145)
Total Protein: 8.2 g/dL (ref 6.4–8.2)

## 2014-07-29 LAB — CBC
HCT: 43.9 % (ref 40.0–52.0)
HGB: 14 g/dL (ref 13.0–18.0)
MCH: 31.5 pg (ref 26.0–34.0)
MCHC: 31.9 g/dL — ABNORMAL LOW (ref 32.0–36.0)
MCV: 99 fL (ref 80–100)
PLATELETS: 208 10*3/uL (ref 150–440)
RBC: 4.44 10*6/uL (ref 4.40–5.90)
RDW: 15.6 % — AB (ref 11.5–14.5)
WBC: 5.5 10*3/uL (ref 3.8–10.6)

## 2014-07-29 LAB — SALICYLATE LEVEL: Salicylates, Serum: 3.5 mg/dL — ABNORMAL HIGH

## 2014-07-29 LAB — ACETAMINOPHEN LEVEL

## 2014-07-29 LAB — ETHANOL: Ethanol: 140 mg/dL

## 2014-07-30 ENCOUNTER — Inpatient Hospital Stay: Payer: Self-pay | Admitting: Psychiatry

## 2014-07-30 LAB — URINALYSIS, COMPLETE
Bacteria: NONE SEEN
Bilirubin,UR: NEGATIVE
GLUCOSE, UR: NEGATIVE mg/dL (ref 0–75)
Ketone: NEGATIVE
Leukocyte Esterase: NEGATIVE
NITRITE: NEGATIVE
PH: 5 (ref 4.5–8.0)
Protein: NEGATIVE
Specific Gravity: 1.015 (ref 1.003–1.030)
WBC UR: 3 /HPF (ref 0–5)

## 2014-07-30 LAB — DRUG SCREEN, URINE
AMPHETAMINES, UR SCREEN: NEGATIVE (ref ?–1000)
BENZODIAZEPINE, UR SCRN: POSITIVE (ref ?–200)
Barbiturates, Ur Screen: NEGATIVE (ref ?–200)
COCAINE METABOLITE, UR ~~LOC~~: POSITIVE (ref ?–300)
Cannabinoid 50 Ng, Ur ~~LOC~~: NEGATIVE (ref ?–50)
MDMA (ECSTASY) UR SCREEN: NEGATIVE (ref ?–500)
METHADONE, UR SCREEN: NEGATIVE (ref ?–300)
OPIATE, UR SCREEN: NEGATIVE (ref ?–300)
Phencyclidine (PCP) Ur S: NEGATIVE (ref ?–25)
Tricyclic, Ur Screen: NEGATIVE (ref ?–1000)

## 2014-08-19 ENCOUNTER — Emergency Department (HOSPITAL_BASED_OUTPATIENT_CLINIC_OR_DEPARTMENT_OTHER)
Admission: EM | Admit: 2014-08-19 | Discharge: 2014-08-19 | Disposition: A | Discharge status: Home / Self Care | Payer: Medicaid Other | Attending: Emergency Medicine | Admitting: Emergency Medicine

## 2014-08-19 ENCOUNTER — Emergency Department (HOSPITAL_BASED_OUTPATIENT_CLINIC_OR_DEPARTMENT_OTHER): Payer: Medicaid Other

## 2014-08-19 ENCOUNTER — Encounter (HOSPITAL_BASED_OUTPATIENT_CLINIC_OR_DEPARTMENT_OTHER): Payer: Self-pay | Admitting: Emergency Medicine

## 2014-08-19 DIAGNOSIS — Z7952 Long term (current) use of systemic steroids: Secondary | ICD-10-CM | POA: Diagnosis not present

## 2014-08-19 DIAGNOSIS — I10 Essential (primary) hypertension: Secondary | ICD-10-CM | POA: Diagnosis not present

## 2014-08-19 DIAGNOSIS — Z7951 Long term (current) use of inhaled steroids: Secondary | ICD-10-CM | POA: Diagnosis not present

## 2014-08-19 DIAGNOSIS — M109 Gout, unspecified: Secondary | ICD-10-CM | POA: Diagnosis not present

## 2014-08-19 DIAGNOSIS — R42 Dizziness and giddiness: Secondary | ICD-10-CM | POA: Insufficient documentation

## 2014-08-19 DIAGNOSIS — K219 Gastro-esophageal reflux disease without esophagitis: Secondary | ICD-10-CM | POA: Diagnosis not present

## 2014-08-19 DIAGNOSIS — Z720 Tobacco use: Secondary | ICD-10-CM | POA: Diagnosis not present

## 2014-08-19 DIAGNOSIS — J449 Chronic obstructive pulmonary disease, unspecified: Secondary | ICD-10-CM | POA: Insufficient documentation

## 2014-08-19 DIAGNOSIS — Z7982 Long term (current) use of aspirin: Secondary | ICD-10-CM | POA: Diagnosis not present

## 2014-08-19 DIAGNOSIS — M1990 Unspecified osteoarthritis, unspecified site: Secondary | ICD-10-CM | POA: Diagnosis not present

## 2014-08-19 DIAGNOSIS — Z791 Long term (current) use of non-steroidal anti-inflammatories (NSAID): Secondary | ICD-10-CM | POA: Insufficient documentation

## 2014-08-19 DIAGNOSIS — R51 Headache: Secondary | ICD-10-CM | POA: Diagnosis present

## 2014-08-19 DIAGNOSIS — H6122 Impacted cerumen, left ear: Secondary | ICD-10-CM | POA: Diagnosis not present

## 2014-08-19 DIAGNOSIS — Z79899 Other long term (current) drug therapy: Secondary | ICD-10-CM | POA: Insufficient documentation

## 2014-08-19 DIAGNOSIS — R519 Headache, unspecified: Secondary | ICD-10-CM

## 2014-08-19 LAB — BASIC METABOLIC PANEL
ANION GAP: 15 (ref 5–15)
BUN: 10 mg/dL (ref 6–23)
CO2: 22 mEq/L (ref 19–32)
Calcium: 9.1 mg/dL (ref 8.4–10.5)
Chloride: 103 mEq/L (ref 96–112)
Creatinine, Ser: 0.9 mg/dL (ref 0.50–1.35)
Glucose, Bld: 96 mg/dL (ref 70–99)
POTASSIUM: 4.2 meq/L (ref 3.7–5.3)
SODIUM: 140 meq/L (ref 137–147)

## 2014-08-19 LAB — CBC WITH DIFFERENTIAL/PLATELET
BASOS ABS: 0 10*3/uL (ref 0.0–0.1)
BASOS PCT: 1 % (ref 0–1)
EOS ABS: 0.1 10*3/uL (ref 0.0–0.7)
Eosinophils Relative: 3 % (ref 0–5)
HCT: 35.2 % — ABNORMAL LOW (ref 39.0–52.0)
Hemoglobin: 11.8 g/dL — ABNORMAL LOW (ref 13.0–17.0)
LYMPHS ABS: 1.6 10*3/uL (ref 0.7–4.0)
Lymphocytes Relative: 42 % (ref 12–46)
MCH: 32.1 pg (ref 26.0–34.0)
MCHC: 33.5 g/dL (ref 30.0–36.0)
MCV: 95.7 fL (ref 78.0–100.0)
Monocytes Absolute: 0.4 10*3/uL (ref 0.1–1.0)
Monocytes Relative: 11 % (ref 3–12)
NEUTROS PCT: 43 % (ref 43–77)
Neutro Abs: 1.6 10*3/uL — ABNORMAL LOW (ref 1.7–7.7)
PLATELETS: 142 10*3/uL — AB (ref 150–400)
RBC: 3.68 MIL/uL — AB (ref 4.22–5.81)
RDW: 14.9 % (ref 11.5–15.5)
WBC: 3.7 10*3/uL — ABNORMAL LOW (ref 4.0–10.5)

## 2014-08-19 MED ORDER — MECLIZINE HCL 25 MG PO TABS: 25.0000 mg | ORAL_TABLET | Freq: Three times a day (TID) | ORAL | Status: DC | PRN

## 2014-08-19 NOTE — ED Provider Notes (Signed)
CSN: [id number]     Arrival date & time 08/19/14  1651 History  This chart was scribed for Brent Speak, MD by Peyton Bottoms, ED Scribe. This patient was seen in room MH04/MH04 and the patient's care was started at 5:19 PM.   Chief Complaint  Patient presents with  . Headache   Patient is a 58 y.o. male presenting with headaches. The history is provided by the patient. No language interpreter was used.  Headache Pain location:  Generalized Quality:  Unable to specify Radiates to:  Does not radiate Onset quality:  Gradual Duration:  4 days Timing:  Constant Progression:  Waxing and waning Chronicity:  Recurrent Similar to prior headaches: yes   Relieved by:  Nothing  HPI Comments: Brent Byrd is a 58 y.o. male with a history of COPD, hyper tension and substance abusewho presents to the Emergency Department complaining of headache and dizziness that began 4 days ago. He states that his BP measured 146/107 earlier today. He states that he wakes up with headaches. He has had prior episodes of similar headaches in the past. He feels like he is spinning when he feels dizzy. He states that he used to take symbicort and spiriva but was taken off of those medications recently. Patient came from [facility] and has been there since 10/15. He states that he was at [facility] for alcohol. He denies associated fever, cough, or congestion.  Past Medical History  Diagnosis Date  . Substance abuse     Alcohol dependence Hx  . Chest pain   . Arthritis     Gout-Left knee  . COPD (chronic obstructive pulmonary disease)   . Hypertension   . Gastroesophageal reflux disease 2014   Past Surgical History  Procedure Laterality Date  . Spine surgery      S/P Herniated disc   Family History  Problem Relation Age of Onset  . Asthma Mother   . Cancer Father     Lung  . Cancer Sister   . Thrombosis Sister    History  Substance Use Topics  . Smoking status: Current Every Day Smoker -- 0.25 packs/day     Types: Cigarettes  . Smokeless tobacco: Never Used  . Alcohol Use: Yes     Comment: Drinking heavy for the last 2 weeks ( Oct. 2014 )   Review of Systems  Neurological: Positive for headaches.  All other systems reviewed and are negative.  A complete 10 system review of systems was obtained and all systems are negative except as noted in the HPI and PMH.   Allergies  Omnipaque  Home Medications   Prior to Admission medications   Medication Sig Start Date End Date Taking? Authorizing Provider  naproxen (NAPROSYN) 250 MG tablet Take by mouth 2 (two) times daily with a meal.   Yes Historical Provider, MD  omeprazole (PRILOSEC) 20 MG capsule Take 20 mg by mouth daily.   Yes Historical Provider, MD  traZODone (DESYREL) 50 MG tablet Take 50 mg by mouth at bedtime.   Yes Historical Provider, MD  amLODipine (NORVASC) 10 MG tablet Take 10 mg by mouth daily.    Historical Provider, MD  aspirin 325 MG tablet Take 325 mg by mouth daily.    Historical Provider, MD  budesonide-formoterol (SYMBICORT) 80-4.5 MCG/ACT inhaler Inhale 2 puffs into the lungs 2 (two) times daily.    Historical Provider, MD  Ipratropium-Albuterol (COMBIVENT IN) Inhale into the lungs. 2 Puffs bid    Historical Provider, MD  Multiple Vitamin (  MULTIVITAMIN) tablet Take 1 tablet by mouth daily.    Historical Provider, MD  nicotine (NICODERM CQ - DOSED IN MG/24 HOURS) 14 mg/24hr patch Place 1 patch onto the skin daily.    Historical Provider, MD  predniSONE (DELTASONE) 50 MG tablet Take 50 mg by mouth daily.    Historical Provider, MD  tiotropium (SPIRIVA) 18 MCG inhalation capsule Place 18 mcg into inhaler and inhale daily.    Historical Provider, MD   Triage Vitals: BP 127/89  Pulse 56  Temp(Src) 98.4 F (36.9 C) (Oral)  Resp 18  SpO2 97%  Physical Exam  Nursing note and vitals reviewed. Constitutional: He is oriented to person, place, and time. He appears well-developed and well-nourished. No distress.  HENT:   Head: Normocephalic and atraumatic.  Mouth/Throat: Oropharynx is clear and moist.  There is a cerumen impaction to the right ear  Eyes: Conjunctivae and EOM are normal. Pupils are equal, round, and reactive to light.  Neck: Normal range of motion. Neck supple. No tracheal deviation present.  Cardiovascular: Normal rate.   No murmur heard. Pulmonary/Chest: Effort normal. No respiratory distress. He has no wheezes. He has no rales.  Musculoskeletal: Normal range of motion.  Neurological: He is alert and oriented to person, place, and time. No cranial nerve deficit. He exhibits normal muscle tone. Coordination normal.  Skin: Skin is warm and dry.  Psychiatric: He has a normal mood and affect. His behavior is normal.   ED Course  Procedures (including critical care time)  DIAGNOSTIC STUDIES: Oxygen Saturation is 97% on RA, normal by my interpretation.    COORDINATION OF CARE: 5:25 PM- Discussed plans to order diagnostic CT of head and lab work. Pt advised of plan for treatment and pt agrees.  Labs Review Labs Reviewed - No data to display  Imaging Review No results found.   EKG Interpretation None     MDM   Final diagnoses:  None    Patient presents with complaints of headache and elevated blood pressure. He was sent from [facility] for this. His neurologic exam is unremarkable, head CT is negative, and laboratory studies are unremarkable as well. He had a cerumen impaction to the left ear that was irrigated successfully with water and peroxide. He is also describing dizziness over the past week that sounds like vertigo. He will be discharged with meclizine and appears appropriate for discharge.  I personally performed the services described in this documentation, which was scribed in my presence. The recorded information has been reviewed and is accurate.  Brent Speak, MD 08/19/14 [phone]

## 2014-08-19 NOTE — Discharge Instructions (Signed)
Meclizine as prescribed as needed for dizziness.  Keep a record of your blood pressures so that you can follow this up with a primary Dr. to discuss whether you need medications or not.   Vertigo Vertigo means you feel like you or your surroundings are moving when they are not. Vertigo can be dangerous if it occurs when you are at work, driving, or performing difficult activities.  CAUSES  Vertigo occurs when there is a conflict of signals sent to your brain from the visual and sensory systems in your body. There are many different causes of vertigo, including:  Infections, especially in the inner ear.  A bad reaction to a drug or misuse of alcohol and medicines.  Withdrawal from drugs or alcohol.  Rapidly changing positions, such as lying down or rolling over in bed.  A migraine headache.  Decreased blood flow to the brain.  Increased pressure in the brain from a head injury, infection, tumor, or bleeding. SYMPTOMS  You may feel as though the world is spinning around or you are falling to the ground. Because your balance is upset, vertigo can cause nausea and vomiting. You may have involuntary eye movements (nystagmus). DIAGNOSIS  Vertigo is usually diagnosed by physical exam. If the cause of your vertigo is unknown, your caregiver may perform imaging tests, such as an MRI scan (magnetic resonance imaging). TREATMENT  Most cases of vertigo resolve on their own, without treatment. Depending on the cause, your caregiver may prescribe certain medicines. If your vertigo is related to body position issues, your caregiver may recommend movements or procedures to correct the problem. In rare cases, if your vertigo is caused by certain inner ear problems, you may need surgery. HOME CARE INSTRUCTIONS   Follow your caregiver's instructions.  Avoid driving.  Avoid operating heavy machinery.  Avoid performing any tasks that would be dangerous to you or others during a vertigo  episode.  Tell your caregiver if you notice that certain medicines seem to be causing your vertigo. Some of the medicines used to treat vertigo episodes can actually make them worse in some people. SEEK IMMEDIATE MEDICAL CARE IF:   Your medicines do not relieve your vertigo or are making it worse.  You develop problems with talking, walking, weakness, or using your arms, hands, or legs.  You develop severe headaches.  Your nausea or vomiting continues or gets worse.  You develop visual changes.  A family member notices behavioral changes.  Your condition gets worse. MAKE SURE YOU:  Understand these instructions.  Will watch your condition.  Will get help right away if you are not doing well or get worse. Document Released: 07/22/2005 Document Revised: 01/04/2012 Document Reviewed: 04/30/2011 [facility] Patient Information 2015 [place], [place]. This information is not intended to replace advice given to you by your health care provider. Make sure you discuss any questions you have with your health care provider.

## 2014-08-19 NOTE — ED Notes (Signed)
Pt from [facility]- reports headache since Wednesday- states they checked his b/p and it was high 140s/100s

## 2014-09-04 ENCOUNTER — Encounter (HOSPITAL_BASED_OUTPATIENT_CLINIC_OR_DEPARTMENT_OTHER): Payer: Self-pay | Admitting: *Deleted

## 2014-09-04 ENCOUNTER — Emergency Department (HOSPITAL_BASED_OUTPATIENT_CLINIC_OR_DEPARTMENT_OTHER)
Admission: EM | Admit: 2014-09-04 | Discharge: 2014-09-04 | Disposition: A | Discharge status: Home / Self Care | Payer: Medicaid Other | Attending: Emergency Medicine | Admitting: Emergency Medicine

## 2014-09-04 DIAGNOSIS — Z7951 Long term (current) use of inhaled steroids: Secondary | ICD-10-CM | POA: Insufficient documentation

## 2014-09-04 DIAGNOSIS — R42 Dizziness and giddiness: Secondary | ICD-10-CM | POA: Diagnosis not present

## 2014-09-04 DIAGNOSIS — I10 Essential (primary) hypertension: Secondary | ICD-10-CM | POA: Insufficient documentation

## 2014-09-04 DIAGNOSIS — Z79899 Other long term (current) drug therapy: Secondary | ICD-10-CM | POA: Diagnosis not present

## 2014-09-04 DIAGNOSIS — J449 Chronic obstructive pulmonary disease, unspecified: Secondary | ICD-10-CM | POA: Insufficient documentation

## 2014-09-04 DIAGNOSIS — Z720 Tobacco use: Secondary | ICD-10-CM | POA: Diagnosis not present

## 2014-09-04 DIAGNOSIS — Z7982 Long term (current) use of aspirin: Secondary | ICD-10-CM | POA: Insufficient documentation

## 2014-09-04 DIAGNOSIS — Z791 Long term (current) use of non-steroidal anti-inflammatories (NSAID): Secondary | ICD-10-CM | POA: Diagnosis not present

## 2014-09-04 DIAGNOSIS — H9192 Unspecified hearing loss, left ear: Secondary | ICD-10-CM | POA: Insufficient documentation

## 2014-09-04 DIAGNOSIS — K219 Gastro-esophageal reflux disease without esophagitis: Secondary | ICD-10-CM | POA: Diagnosis not present

## 2014-09-04 DIAGNOSIS — H6122 Impacted cerumen, left ear: Secondary | ICD-10-CM | POA: Diagnosis not present

## 2014-09-04 DIAGNOSIS — M1990 Unspecified osteoarthritis, unspecified site: Secondary | ICD-10-CM | POA: Insufficient documentation

## 2014-09-04 DIAGNOSIS — H9202 Otalgia, left ear: Secondary | ICD-10-CM | POA: Diagnosis present

## 2014-09-04 MED ORDER — ACETAMINOPHEN 325 MG PO TABS
650.0000 mg | ORAL_TABLET | Freq: Once | ORAL | Status: AC
Start: 2014-09-04 — End: 2014-09-04
  Administered 2014-09-04: 650 mg via ORAL
  Filled 2014-09-04: qty 2

## 2014-09-04 MED ORDER — MECLIZINE HCL 25 MG PO TABS: 25.0000 mg | ORAL_TABLET | Freq: Three times a day (TID) | ORAL | Status: DC | PRN

## 2014-09-04 NOTE — ED Notes (Signed)
Pain in his left ear since last night.

## 2014-09-04 NOTE — Discharge Instructions (Signed)
If you were given medicines take as directed.  If you are on coumadin or contraceptives realize their levels and effectiveness is altered by many different medicines.  If you have any reaction (rash, tongues swelling, other) to the medicines stop taking and see a physician.   Please follow up as directed and return to the ER or see a physician for new or worsening symptoms.  Thank you. Filed Vitals:   09/04/14 1438  BP: 115/78  Pulse: 85  Temp: 98.4 F (36.9 C)  TempSrc: Oral  Resp: 20  Height: 6\' 4"  (1.93 m)  Weight: 265 lb (120.203 kg)  SpO2: 95%

## 2014-09-04 NOTE — ED Provider Notes (Signed)
CSN: [id number]     Arrival date & time 09/04/14  1422 History   First MD Initiated Contact with Patient 09/04/14 1504     Chief Complaint  Patient presents with  . Otalgia     (Consider location/radiation/quality/duration/timing/severity/associated sxs/prior Treatment) HPI Comments: 58 year old with history of alcohol abuse, COPD, high blood pressure, reflux presents with left ear pain, decreased hearing and muffled sounds of the left ear. Patient has had this in the past few weeks back and improved when he had wax removed. Patient has mild intermittent vertigo with head movement only. No other neuro complaints. Patient is walking without difficulty and no vision changes.  Patient is a 58 y.o. male presenting with ear pain. The history is provided by the patient.  Otalgia Associated symptoms: no fever, no headaches, no rash and no vomiting     Past Medical History  Diagnosis Date  . Substance abuse     Alcohol dependence Hx  . Chest pain   . Arthritis     Gout-Left knee  . COPD (chronic obstructive pulmonary disease)   . Hypertension   . Gastroesophageal reflux disease 2014   Past Surgical History  Procedure Laterality Date  . Spine surgery      S/P Herniated disc   Family History  Problem Relation Age of Onset  . Asthma Mother   . Cancer Father     Lung  . Cancer Sister   . Thrombosis Sister    History  Substance Use Topics  . Smoking status: Current Every Day Smoker -- 0.25 packs/day    Types: Cigarettes  . Smokeless tobacco: Never Used  . Alcohol Use: Yes     Comment: Drinking heavy for the last 2 weeks ( Oct. 2014 )    Review of Systems  Constitutional: Negative for fever and chills.  HENT: Positive for ear pain.   Eyes: Negative for visual disturbance.  Respiratory: Negative for shortness of breath.   Cardiovascular: Negative for chest pain.  Gastrointestinal: Negative for vomiting.  Musculoskeletal: Negative for gait problem.  Skin: Negative for rash.   Neurological: Negative for weakness, numbness and headaches.      Allergies  Omnipaque  Home Medications   Prior to Admission medications   Medication Sig Start Date End Date Taking? Authorizing Provider  amLODipine (NORVASC) 10 MG tablet Take 10 mg by mouth daily.    Historical Provider, MD  aspirin 325 MG tablet Take 325 mg by mouth daily.    Historical Provider, MD  budesonide-formoterol (SYMBICORT) 80-4.5 MCG/ACT inhaler Inhale 2 puffs into the lungs 2 (two) times daily.    Historical Provider, MD  Ipratropium-Albuterol (COMBIVENT IN) Inhale into the lungs. 2 Puffs bid    Historical Provider, MD  meclizine (ANTIVERT) 25 MG tablet Take 1 tablet (25 mg total) by mouth 3 (three) times daily as needed for dizziness. 09/04/14   Mariea Clonts, MD  Multiple Vitamin (MULTIVITAMIN) tablet Take 1 tablet by mouth daily.    Historical Provider, MD  naproxen (NAPROSYN) 250 MG tablet Take by mouth 2 (two) times daily with a meal.    Historical Provider, MD  nicotine (NICODERM CQ - DOSED IN MG/24 HOURS) 14 mg/24hr patch Place 1 patch onto the skin daily.    Historical Provider, MD  omeprazole (PRILOSEC) 20 MG capsule Take 20 mg by mouth daily.    Historical Provider, MD  predniSONE (DELTASONE) 50 MG tablet Take 50 mg by mouth daily.    Historical Provider, MD  tiotropium (SPIRIVA) 18  MCG inhalation capsule Place 18 mcg into inhaler and inhale daily.    Historical Provider, MD  traZODone (DESYREL) 50 MG tablet Take 50 mg by mouth at bedtime.    Historical Provider, MD   BP 115/78 mmHg  Pulse 85  Temp(Src) 98.4 F (36.9 C) (Oral)  Resp 20  Ht 6\' 4"  (1.93 m)  Wt 265 lb (120.203 kg)  BMI 32.27 kg/m2  SpO2 95% Physical Exam  Constitutional: He is oriented to person, place, and time. He appears well-developed and well-nourished.  HENT:  Head: Normocephalic and atraumatic.  Right TM and canal unremarkable. Left ear canal moderate cerumen hard and dry, half of tympanic membrane visualized no  signs of infection, no bleeding, no drainage, no pain with moving the ear, no swelling or redness posterior auricular.  Eyes: Conjunctivae are normal. Right eye exhibits no discharge. Left eye exhibits no discharge.  Neck: Normal range of motion. Neck supple. No tracheal deviation present.  Cardiovascular: Normal rate.   Pulmonary/Chest: Effort normal.  Abdominal: Soft. There is no guarding.  Musculoskeletal: He exhibits no edema.  Neurological: He is alert and oriented to person, place, and time. GCS eye subscore is 4. GCS verbal subscore is 5. GCS motor subscore is 6.  5+ strength in UE and LE with f/e at major joints. Sensation to palpation intact in UE and LE. CNs 2-12 grossly intact.  EOMFI.  PERRL.   Finger nose and coordination intact bilateral.   Visual fields intact to finger testing. No nystagmus  Skin: Skin is warm. No rash noted.  Psychiatric: He has a normal mood and affect.  Nursing note and vitals reviewed.   ED Course  Procedures (including critical care time) Labs Review Labs Reviewed - No data to display  Imaging Review No results found.   EKG Interpretation None      MDM   Final diagnoses:  Vertigo  Hearing loss of left ear due to cerumen impaction   Patient presents with mild intermittent vertigo and left ear discomfort, clinically secondary to cerumen. Patient has normal neuro exam. Nurse removed cerumen in the ER. Discussed when necessary meclizine and follow-up with ENT if worsening symptoms.  Results and differential diagnosis were discussed with the patient/parent/guardian. Close follow up outpatient was discussed, comfortable with the plan.   Medications  acetaminophen (TYLENOL) tablet 650 mg (650 mg Oral Given 09/04/14 1533)    Filed Vitals:   09/04/14 1438  BP: 115/78  Pulse: 85  Temp: 98.4 F (36.9 C)  TempSrc: Oral  Resp: 20  Height: 6\' 4"  (1.93 m)  Weight: 265 lb (120.203 kg)  SpO2: 95%    Final diagnoses:  Vertigo  Hearing  loss of left ear due to cerumen impaction        Mariea Clonts, MD 09/04/14 1550

## 2014-09-25 ENCOUNTER — Emergency Department (HOSPITAL_BASED_OUTPATIENT_CLINIC_OR_DEPARTMENT_OTHER)
Admission: EM | Admit: 2014-09-25 | Discharge: 2014-09-25 | Disposition: A | Discharge status: Home / Self Care | Payer: Medicaid Other | Attending: Emergency Medicine | Admitting: Emergency Medicine

## 2014-09-25 ENCOUNTER — Encounter (HOSPITAL_BASED_OUTPATIENT_CLINIC_OR_DEPARTMENT_OTHER): Payer: Self-pay | Admitting: *Deleted

## 2014-09-25 DIAGNOSIS — J449 Chronic obstructive pulmonary disease, unspecified: Secondary | ICD-10-CM | POA: Diagnosis not present

## 2014-09-25 DIAGNOSIS — R0981 Nasal congestion: Secondary | ICD-10-CM | POA: Diagnosis present

## 2014-09-25 DIAGNOSIS — Z79899 Other long term (current) drug therapy: Secondary | ICD-10-CM | POA: Insufficient documentation

## 2014-09-25 DIAGNOSIS — J069 Acute upper respiratory infection, unspecified: Secondary | ICD-10-CM | POA: Diagnosis not present

## 2014-09-25 DIAGNOSIS — Z791 Long term (current) use of non-steroidal anti-inflammatories (NSAID): Secondary | ICD-10-CM | POA: Diagnosis not present

## 2014-09-25 DIAGNOSIS — I10 Essential (primary) hypertension: Secondary | ICD-10-CM | POA: Diagnosis not present

## 2014-09-25 DIAGNOSIS — Z7951 Long term (current) use of inhaled steroids: Secondary | ICD-10-CM | POA: Diagnosis not present

## 2014-09-25 DIAGNOSIS — Z720 Tobacco use: Secondary | ICD-10-CM | POA: Insufficient documentation

## 2014-09-25 DIAGNOSIS — Z7952 Long term (current) use of systemic steroids: Secondary | ICD-10-CM | POA: Diagnosis not present

## 2014-09-25 DIAGNOSIS — Z7982 Long term (current) use of aspirin: Secondary | ICD-10-CM | POA: Insufficient documentation

## 2014-09-25 DIAGNOSIS — K219 Gastro-esophageal reflux disease without esophagitis: Secondary | ICD-10-CM | POA: Diagnosis not present

## 2014-09-25 DIAGNOSIS — M1990 Unspecified osteoarthritis, unspecified site: Secondary | ICD-10-CM | POA: Diagnosis not present

## 2014-09-25 MED ORDER — PSEUDOEPHEDRINE HCL 30 MG PO TABS
30.0000 mg | ORAL_TABLET | Freq: Once | ORAL | Status: AC
  Administered 2014-09-25: 30 mg via ORAL
  Filled 2014-09-25: qty 1

## 2014-09-25 MED ORDER — ACETAMINOPHEN 500 MG PO TABS
1000.0000 mg | ORAL_TABLET | Freq: Once | ORAL | Status: AC
Start: 2014-09-25 — End: 2014-09-25
  Administered 2014-09-25: 1000 mg via ORAL
  Filled 2014-09-25: qty 2

## 2014-09-25 MED ORDER — PSEUDOEPHEDRINE HCL 60 MG PO TABS: 60.0000 mg | ORAL_TABLET | Freq: Four times a day (QID) | ORAL | Status: AC | PRN

## 2014-09-25 MED ORDER — ACETAMINOPHEN 500 MG PO TABS: 1000.0000 mg | ORAL_TABLET | Freq: Four times a day (QID) | ORAL | Status: DC | PRN

## 2014-09-25 NOTE — Discharge Instructions (Signed)
Upper Respiratory Infection, Adult An upper respiratory infection (URI) is also sometimes known as the common cold. The upper respiratory tract includes the nose, sinuses, throat, trachea, and bronchi. Bronchi are the airways leading to the lungs. Most people improve within 1 week, but symptoms can last up to 2 weeks. A residual cough may last even longer.  CAUSES Many different viruses can infect the tissues lining the upper respiratory tract. The tissues become irritated and inflamed and often become very moist. Mucus production is also common. A cold is contagious. You can easily spread the virus to others by oral contact. This includes kissing, sharing a glass, coughing, or sneezing. Touching your mouth or nose and then touching a surface, which is then touched by another person, can also spread the virus. SYMPTOMS  Symptoms typically develop 1 to 3 days after you come in contact with a cold virus. Symptoms vary from person to person. They may include:  Runny nose.  Sneezing.  Nasal congestion.  Sinus irritation.  Sore throat.  Loss of voice (laryngitis).  Cough.  Fatigue.  Muscle aches.  Loss of appetite.  Headache.  Low-grade fever. DIAGNOSIS  You might diagnose your own cold based on familiar symptoms, since most people get a cold 2 to 3 times a year. Your caregiver can confirm this based on your exam. Most importantly, your caregiver can check that your symptoms are not due to another disease such as strep throat, sinusitis, pneumonia, asthma, or epiglottitis. Blood tests, throat tests, and X-rays are not necessary to diagnose a common cold, but they may sometimes be helpful in excluding other more serious diseases. Your caregiver will decide if any further tests are required. RISKS AND COMPLICATIONS  You may be at risk for a more severe case of the common cold if you smoke cigarettes, have chronic heart disease (such as heart failure) or lung disease (such as asthma), or if  you have a weakened immune system. The very young and very old are also at risk for more serious infections. Bacterial sinusitis, middle ear infections, and bacterial pneumonia can complicate the common cold. The common cold can worsen asthma and chronic obstructive pulmonary disease (COPD). Sometimes, these complications can require emergency medical care and may be life-threatening. PREVENTION  The best way to protect against getting a cold is to practice good hygiene. Avoid oral or hand contact with people with cold symptoms. Wash your hands often if contact occurs. There is no clear evidence that vitamin C, vitamin E, echinacea, or exercise reduces the chance of developing a cold. However, it is always recommended to get plenty of rest and practice good nutrition. TREATMENT  Treatment is directed at relieving symptoms. There is no cure. Antibiotics are not effective, because the infection is caused by a virus, not by bacteria. Treatment may include:  Increased fluid intake. Sports drinks offer valuable electrolytes, sugars, and fluids.  Breathing heated mist or steam (vaporizer or shower).  Eating chicken soup or other clear broths, and maintaining good nutrition.  Getting plenty of rest.  Using gargles or lozenges for comfort.  Controlling fevers with ibuprofen or acetaminophen as directed by your caregiver.  Increasing usage of your inhaler if you have asthma. Zinc gel and zinc lozenges, taken in the first 24 hours of the common cold, can shorten the duration and lessen the severity of symptoms. Pain medicines may help with fever, muscle aches, and throat pain. A variety of non-prescription medicines are available to treat congestion and runny nose. Your caregiver   can make recommendations and may suggest nasal or lung inhalers for other symptoms.  HOME CARE INSTRUCTIONS   Only take over-the-counter or prescription medicines for pain, discomfort, or fever as directed by your  caregiver.  Use a warm mist humidifier or inhale steam from a shower to increase air moisture. This may keep secretions moist and make it easier to breathe.  Drink enough water and fluids to keep your urine clear or pale yellow.  Rest as needed.  Return to work when your temperature has returned to normal or as your caregiver advises. You may need to stay home longer to avoid infecting others. You can also use a face mask and careful hand washing to prevent spread of the virus. SEEK MEDICAL CARE IF:   After the first few days, you feel you are getting worse rather than better.  You need your caregiver's advice about medicines to control symptoms.  You develop chills, worsening shortness of breath, or brown or red sputum. These may be signs of pneumonia.  You develop yellow or brown nasal discharge or pain in the face, especially when you bend forward. These may be signs of sinusitis.  You develop a fever, swollen neck glands, pain with swallowing, or white areas in the back of your throat. These may be signs of strep throat. SEEK IMMEDIATE MEDICAL CARE IF:   You have a fever.  You develop severe or persistent headache, ear pain, sinus pain, or chest pain.  You develop wheezing, a prolonged cough, cough up blood, or have a change in your usual mucus (if you have chronic lung disease).  You develop sore muscles or a stiff neck. Document Released: 04/07/2001 Document Revised: 01/04/2012 Document Reviewed: 01/17/2014 [employer] Patient Information 2015 [employer]. This information is not intended to replace advice given to you by your health care provider. Make sure you discuss any questions you have with your health care provider.  

## 2014-09-25 NOTE — ED Notes (Signed)
Pt amb to room 6 with quick steady gait in nad. Pt reports cough and congestion x 10 days.

## 2014-09-25 NOTE — ED Provider Notes (Signed)
CSN: [id number]     Arrival date & time 09/25/14  4665 History   First MD Initiated Contact with Patient 09/25/14 0912     Chief Complaint  Patient presents with  . Nasal Congestion     (Consider location/radiation/quality/duration/timing/severity/associated sxs/prior Treatment) Patient is a 58 y.o. male presenting with URI.  URI Presenting symptoms: congestion, cough and sore throat   Presenting symptoms: no fever   Severity:  Moderate Onset quality:  Gradual Duration:  10 days Timing:  Constant Progression:  Unchanged Chronicity:  New Relieved by:  Nothing Worsened by:  Nothing tried Ineffective treatments: Ibuprofen. Associated symptoms: headaches and sinus pain     Past Medical History  Diagnosis Date  . Substance abuse     Alcohol dependence Hx  . Chest pain   . Arthritis     Gout-Left knee  . COPD (chronic obstructive pulmonary disease)   . Hypertension   . Gastroesophageal reflux disease 2014   Past Surgical History  Procedure Laterality Date  . Spine surgery      S/P Herniated disc   Family History  Problem Relation Age of Onset  . Asthma Mother   . Cancer Father     Lung  . Cancer Sister   . Thrombosis Sister    History  Substance Use Topics  . Smoking status: Current Every Day Smoker -- 0.25 packs/day    Types: Cigarettes  . Smokeless tobacco: Never Used  . Alcohol Use: Yes     Comment: Drinking heavy for the last 2 weeks ( Oct. 2014 )    Review of Systems  Constitutional: Negative for fever.  HENT: Positive for congestion and sore throat.   Respiratory: Positive for cough.   Neurological: Positive for headaches.  All other systems reviewed and are negative.     Allergies  Omnipaque  Home Medications   Prior to Admission medications   Medication Sig Start Date End Date Taking? Authorizing Provider  amLODipine (NORVASC) 10 MG tablet Take 10 mg by mouth daily.    Historical Provider, MD  aspirin 325 MG tablet Take 325 mg by mouth  daily.    Historical Provider, MD  budesonide-formoterol (SYMBICORT) 80-4.5 MCG/ACT inhaler Inhale 2 puffs into the lungs 2 (two) times daily.    Historical Provider, MD  Ipratropium-Albuterol (COMBIVENT IN) Inhale into the lungs. 2 Puffs bid    Historical Provider, MD  meclizine (ANTIVERT) 25 MG tablet Take 1 tablet (25 mg total) by mouth 3 (three) times daily as needed for dizziness. 09/04/14   Mariea Clonts, MD  Multiple Vitamin (MULTIVITAMIN) tablet Take 1 tablet by mouth daily.    Historical Provider, MD  naproxen (NAPROSYN) 250 MG tablet Take by mouth 2 (two) times daily with a meal.    Historical Provider, MD  nicotine (NICODERM CQ - DOSED IN MG/24 HOURS) 14 mg/24hr patch Place 1 patch onto the skin daily.    Historical Provider, MD  omeprazole (PRILOSEC) 20 MG capsule Take 20 mg by mouth daily.    Historical Provider, MD  predniSONE (DELTASONE) 50 MG tablet Take 50 mg by mouth daily.    Historical Provider, MD  tiotropium (SPIRIVA) 18 MCG inhalation capsule Place 18 mcg into inhaler and inhale daily.    Historical Provider, MD  traZODone (DESYREL) 50 MG tablet Take 50 mg by mouth at bedtime.    Historical Provider, MD   BP 117/76 mmHg  Pulse 81  Temp(Src) 97.9 F (36.6 C) (Oral)  Resp 18  SpO2 97% Physical  Exam  Constitutional: He is oriented to person, place, and time. He appears well-developed and well-nourished. No distress.  HENT:  Head: Normocephalic and atraumatic.  Right Ear: Tympanic membrane and ear canal normal.  Left Ear: Tympanic membrane and ear canal normal.  Nose: Right sinus exhibits frontal sinus tenderness. Right sinus exhibits no maxillary sinus tenderness. Left sinus exhibits frontal sinus tenderness. Left sinus exhibits no maxillary sinus tenderness.  Mouth/Throat: Mucous membranes are normal. Posterior oropharyngeal erythema present. No oropharyngeal exudate, posterior oropharyngeal edema or tonsillar abscesses.  Eyes: Conjunctivae are normal. Pupils are  equal, round, and reactive to light. No scleral icterus.  Neck: Neck supple.  Cardiovascular: Normal rate, regular rhythm, normal heart sounds and intact distal pulses.   No murmur heard. Pulmonary/Chest: Effort normal and breath sounds normal. No stridor. No respiratory distress. He has no wheezes. He has no rales.  Abdominal: Soft. He exhibits no distension. There is no tenderness.  Musculoskeletal: Normal range of motion. He exhibits no edema.  Neurological: He is alert and oriented to person, place, and time.  Skin: Skin is warm and dry. No rash noted.  Psychiatric: He has a normal mood and affect. His behavior is normal.  Nursing note and vitals reviewed.   ED Course  Procedures (including critical care time) Labs Review Labs Reviewed - No data to display  Imaging Review No results found.   EKG Interpretation None      MDM   Final diagnoses:  Acute URI    58 year old male presenting with URI symptoms. Nontoxic, not distressed. No evidence of bacterial infection. Plan supportive treatment.    Artis Delay, MD 09/25/14 1000

## 2015-02-12 NOTE — Op Note (Signed)
PATIENT NAME:  Brent Byrd, Brent Byrd MR#:  [id number] DATE OF BIRTH:  09-19-1956  DATE OF PROCEDURE:  08/05/2012  PREOPERATIVE DIAGNOSIS: Hammertoe of left second toe.    POSTOPERATIVE DIAGNOSIS:  Hammertoe of left second toe.   PROCEDURE: Hammertoe correction of left second toe with K wire fixation.   SURGEON: Sharlotte Alamo, DPM  ANESTHESIA: Local MAC.  HEMOSTASIS: Pneumatic tourniquet to left ankle, 250 mmHg.   ESTIMATED BLOOD LOSS: Minimal.   MATERIALS: One 0.045 inch K wire.   PATHOLOGY: None.   COMPLICATIONS: None apparent.   OPERATIVE INDICATIONS: This is a 58 year old male with chronic long-term history of a painful hammertoe on his left second toe. Conservative outpatient treatment has been unsuccessful and he elects for surgical correction.   DESCRIPTION OF PROCEDURE: The patient was taken to the Operating Room and placed on the table, in the supine position. Following satisfactory sedation, the left foot was anesthetized with 10 mL of 0.5% Sensorcaine plain around the second metatarsal, on the second toe. A pneumatic tourniquet was applied at the level of the left ankle and the foot was prepped and draped in the usual sterile fashion. The foot was exsanguinated and the tourniquet inflated to 250 mmHg.   Attention was then directed to the dorsal aspect of the left second toe where an approximate 2.5 cm elliptical incision was made over the dorsal aspect of the toe. The skin wedge was removed and the incision was deepened down to the level of the joint where a transverse tenotomy was performed at the level of the proximal and distal interphalangeal joints. The head of the proximal and middle phalanx were then freed from the surrounding normal anatomy and removed in toto using a pneumatic saw. There was noted to be good release of the contracture on the second toe. A 0.045 inch K wire was then driven distally through the distal phalanx out the tip of the toe and then retrograded back through  the middle phalanx and into the proximal phalanx. Intraoperative FluoroScan views revealed good placement of the K wire and alignment of the second toe. The wound was flushed with copious amounts of sterile saline and closed using 4-0 Vicryl suture for tendon reapproximation followed by skin closure using 5-0 nylon simple interrupted sutures. Betadine, Xeroform, and a sterile dressing was applied. Tourniquet was released and blood flow was noted to return immediately to the left foot and digits. The patient tolerated the procedure and anesthesia well and was transported to post anesthesia care unit with vital signs stable and in good condition.  ____________________________ Sharlotte Alamo, DPM tc:slb D: 08/05/2012 08:44:32 ET T: 08/05/2012 10:46:29 ET JOB#: [id number]  cc: Sharlotte Alamo, DPM, <Dictator> Analiya Porco DPM ELECTRONICALLY SIGNED 08/12/2012 10:29

## 2015-02-15 NOTE — H&P (Signed)
PATIENT NAME:  DICK, HARK MR#:  [id number] DATE OF BIRTH:  1956/08/02  DATE OF ADMISSION:  02/24/2013  PRIMARY CARE PHYSICIAN:  [facility]   EMERGENCY DEPARTMENT REFERRING PHYSICIAN:  Dr. Jimmye Norman.   CHIEF COMPLAINT:  Shortness of breath, chest pressure.   HISTORY OF PRESENT ILLNESS:  The patient is a 59 year old African American male with history of alcohol abuse, history of cocaine abuse in the past who presents with complaint of shortness of breath since this morning. The patient reports that on Monday and Tuesday he threw up and also has been drinking heavily. He has drank a significant amount of beer today as well as tequila. He reports that his breathing has gotten worse, so he is coming to the ER. He also complains of chest pressure, worse with taking deep breaths. In the ED the patient had an x-ray of his chest which basically showed he had a right lower lobe pneumonia. The patient has been admitted previously with chest pain. He was admitted in April 2013 with some leg weakness felt to be due to disk disease in his back. He otherwise denies any fevers or chills. He does have COPD but states that he has not wheezing. He is coughing up yellowish sputum. He denies any abdominal pain. Denies any urinary frequency, urgency, or hesitancy.   PAST MEDICAL HISTORY:  Significant for: 1.  Hypertension, history of COPD, ongoing tobacco abuse.  2.  GERD.  3.  History of cocaine abuse in the past. He reports that he does not use cocaine; however his TUDS are currently positive for cocaine.   PAST SURGICAL HISTORY:  1.  Status post back surgery for herniated disk.  2.  History of surgery for in his wrist.   ALLERGIES:  IVP DYE.   CURRENT MEDICATIONS:  At home: Combivent 2 puffs b.i.d., Norvasc 10 daily, omeprazole 20 daily.   SOCIAL HISTORY:  Chronic smoker, smokes about 1 pack per day. He drinks heavily; he usually drinks 12 beers he says over the weekend and does not drink on a daily basis  but states that his friends are here so he has been drinking heavily. Denies cocaine use, but his TUDS are positive for cocaine.   FAMILY HISTORY:  Parents are deceased. His mother died from respiratory failure. She suffered asthma. Father died from lung cancer.   REVIEW OF SYSTEMS: CONSTITUTIONAL: Denies any fevers. Complains of fatigue, weakness. No has chronic back pain. No weight loss. No weight gain.  EYES: No blurred or double vision. No pain. No redness. No inflammation.  ENT: No tinnitus. No ear pain. No hearing loss. No seasonal or year-round allergies. No difficulty swallowing.  RESPIRATORY: Complains of cough, wheezing. Has a history of COPD.  CARDIOVASCULAR: Complains of chest pressure. Denies any orthopnea, edema or arrhythmia.  GASTROINTESTINAL: No nausea, vomiting, diarrhea now. He had some episodes of vomiting a few days ago. No abdominal pain. No hematemesis. No melena.  GENITOURINARY: Denies any dysuria, hematuria, renal calculus or frequency.  ENDOCRINE: Denies any polyuria, nocturia, or thyroid problems.  HEMATOLOGIC/LYMPHATIC: Denies anemia, easy bruisability or bleeding.  SKIN: No acne. No rash. No changes in mole, hair or skin.  MUSCULOSKELETAL: Denies any pain in the neck, back or shoulder.  NEUROLOGIC: No numbness. No CVA. No transient ischemic attack. No seizures.  PSYCHIATRIC: No anxiety. No insomnia. No ADD.   PHYSICAL EXAMINATION:  VITAL SIGNS: Temperature 97.5, pulse 86, respirations 22, blood pressure 149/91, O2 96% on 2 liters.  GENERAL: The patient is a  well-developed, well-nourished [place] American male, smells of alcohol.  HEENT: Head atraumatic, normocephalic. Pupils equally round, reactive to light and accommodation. There is no conjunctivae pallor, no scleral icterus. Nasal exam shows no drainage or ulceration.  EARS: no erythema, no draiange OROPHARYNX: Clear without any exudate.  NECK: No thyromegaly. No carotid bruits.  CARDIOVASCULAR: Regular rate  and rhythm. No murmurs, rubs, clicks or gallops. PMI is not displaced.  LUNGS: He has some right base rhonchi. No wheezing. No accessory muscle usage.  ABDOMEN: Soft, nontender, nondistended. Positive bowel sounds x4.  GU: deffered EXTREMITIES: No clubbing, cyanosis, or edema.  Muskuloskeletal: no erythema, no drainage SKIN: No rash.  LYMPHATICS: No lymph nodes palpable.  VASCULAR: Good DP, PT pulses.  PSYCHIATRIC: Not anxious or depressed.  NEUROLOGIC: Alert, oriented x3. No focal deficits.   EVALUATIONS IN THE EMERGENCY DEPARTMENT:  His TUDS are positive for cocaine. CPK is 61, CK-MB is 5.0. Troponin less than 0.02, glucose 79, BUN 6, creatinine 0.75, sodium 135, potassium 3.6, chloride 105, CO2 is 21, calcium 9.1, bilirubin total 0.5, alkaline phosphatase 98, AST 36, ALT 29, total protein 5.6. Alcohol level is 0.137. Chest x-ray shows right lower lobe pneumonia, mild cardiomegaly.   ASSESSMENT AND PLAN: The patient is a 59 year old African American male with alcohol abuse and drug abuse, presents with shortness of breath, chest pressure.  1.  Shortness of breath likely due to aspiration pneumonia. At this time I will treat him with IV Unasyn. When he is ready he can be converted to p.o. Augmentin. We will place him on p.r.n. nebulizers as well.  2.  Chest pressure, atypical in nature. His CPK total and CK-MB are elevated. His EKG shows nonspecific changes. His pain is atypical. We will follow cardiac enzymes, place him on aspirin if symptoms persist or his cardiac enzymes suggest ischemia, then we will get a cardiology evaluation.  3.  Alcohol abuse. We will place him on clinical institute withdrawal assessment (CIWA) protocol.  4.  Hypertension accelerated. We will continue Norvasc. I will put him on p.r.n. hydralazine.  5.  Tobacco abuse. The patient recommended to stop smoking. He was counseled for more than 3 minutes. He will be placed on a nicotine patch.   TIME SPENT: 45 minutes spent.    ____________________________ Lafonda Mosses. Posey Pronto, MD shp:lf D: 02/24/2013 19:48:35 ET T: 02/24/2013 21:38:38 ET JOB#: [id number]  cc: Kendrew Paci H. Posey Pronto, MD, <Dictator> Alric Seton MD ELECTRONICALLY SIGNED 03/04/2013 14:42
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
## 2015-02-15 NOTE — H&P (Signed)
PATIENT NAME:  Brent Byrd, Brent Byrd MR#:  [id number] DATE OF BIRTH:  [birth year]  DATE OF ADMISSION:  07/29/2013  PRIMARY CARE PHYSICIAN: At [facility].    CHIEF COMPLAINT: Chest pain.   HISTORY OF PRESENT ILLNESS: This is a 59 year old male with a past medical history of hypertension, COPD, ongoing smoker and cocaine use, came to the Emergency Room because since last night he is having chest pain, which is on the center of the chest and dull 5 to 6 out of 10. Not associated with any movement and not getting alleviated by anything so he decided to come to the Emergency Room for that. Denies any cough or shortness of breath. Denies any palpitations. In the ER on work-up he was found having elevated d-dimer more than 5.  He has anaphylactic reaction allergy to dye and so his CT scan could not be done. He is being admitted to telemetry for observation for chest pain and to get a V/Q scan done when possible and also to watch him for withdrawal symptoms.   REVIEW OF SYSTEMS: CONSTITUTIONAL: Negative for fever, fatigue, weakness. Positive for chest pain. No weight loss or weight gain.  EYES: No blurring, double vision, pain or redness.  EARS, NOSE, THROAT: No tinnitus, ear pain or hearing loss.  RESPIRATORY: No cough, wheezing, hemoptysis or shortness of breath.  CARDIOVASCULAR: Has some chest pain, but no palpitations or arrhythmia.  GASTROINTESTINAL: No nausea, vomiting, diarrhea, abdominal pain.  GENITOURINARY: No dysuria, hematuria or increased frequency.  ENDOCRINE: No increased sweating, heat or cold intolerance.  SKIN: No acne, rashes or lesions on the skin.  MUSCULOSKELETAL: No pain or swelling in the joints.  NEUROLOGICAL: No numbness, weakness or tremors.  PSYCHIATRIC: No anxiety, insomnia or bipolar disorder.   PAST MEDICAL HISTORY:  1.  Hypertension.  2.  COPD.  3.  Ongoing tobacco use.  4.  Gastroesophageal reflux disease. 5.  Cocaine abuse.   PAST SURGICAL HISTORY: 1.  Status post  surgery of herniated disk.  2.  Surgery of his wrist.   ALLERGY:  IV DYE.   SOCIAL HISTORY: Chronic smoker, smokes about 1 pack in 4 to 5 days. He has been drinking heavy for the last 2 weeks and he has been using cocaine for the last 2 to 3 days.   FAMILY HISTORY: Parents died.  Mother died of respiratory failure and she had asthma. Father died of lung cancer and his sister died of some cancer, but she had deep vein thrombosis in her 15s.  HOME MEDICATIONS: 1.  Trazodone 150 mg oral once a day.  2.  Tiotropium 18 mcg inhalation once a day.  3.  Omeprazole 20 mg once a day.  4.  Combivent 2 puffs 2 times a day.  5.  Aspirin 81 mg once a day.  6.  Amlodipine 10 mg once a day.   PHYSICAL EXAMINATION: VITAL SIGNS: In the ER, temperature 98.5, pulse 69, respirations 19, blood pressure 127/85 pulse ox 95% on room air.  GENERAL: The patient is fully alert and oriented to time, place and person and does not appear in any acute distress at this time.  HEENT: Head and neck atraumatic. Conjunctivae pink. Oral mucosa moist.  NECK: Supple. No JVD.  RESPIRATORY: Bilateral clear and equal air entry.  CARDIOVASCULAR: S1, S2 present, regular. No murmur. No local tenderness on the chest compression.  ABDOMEN: Soft, nontender bowel sounds present. No organomegaly.  SKIN: No rashes.  LEGS: No edema.  JOINTS: No swelling or tenderness.  NEUROLOGICAL: Power 5 out of 5. Follows commands. No tremors.  PSYCHIATRIC: Does not appear in any acute psychiatric illness at this time.   IMPORTANT LABORATORY RESULTS:  Glucose 78, BUN 7, creatinine 0.84, sodium 140, potassium 4.0, CO2 of 24, calcium 9.0. Troponin less than 0.02. WBC 3.8, hemoglobin 13.9, platelet count 125 and MCV 96. INR is 1. D-dimer 5.4. Chest x-ray, PA and lateral:  Findings suggest pulmonary fibrosis and likely an element of underlying COPD.  No focal pneumonia or definite evidence of COPD.     ASSESSMENT AND PLAN: A 59 year old male with a past  history of hypertension, who is a smoker and cocaine abuser and alcoholic with family history of deep venous thrombosis came to the Emergency Room after having chest pain since last night and his D-dimer was found to be elevated.  1.  Chest pain. Will observe him on telemetry for further monitoring of troponins and an irregular cardiac rhythm. As he is a smoker and alcohol abuser, we want to rule out any coronary event.  2.  Elevated D-dimer. He is allergic to IV dye so CT scan cannot be done at this time. V/Q scan is not possible because of the weekend. We will do the Doppler study in lower limbs and will do  V/Q scan if needed on Monday. Meanwhile because of family history of deep vein thrombosis and elevated D-dimer with chest pain, we will give him subcutaneous Lovenox 1 mg/kg b.i.d.  3.  Hypertension. We will continue amlodipine.  4.  Chronic obstructive pulmonary disease. Currently no wheezing. We will continue Spiriva and Advair on his home dose basis.  5.  Cocaine and alcohol abuse. The patient to go for drug rehab. Psych consult will be called in for this issue.  6.  Current smoker: Smoking cessation counseling done for 5 minutes and the patient agreed to try nicotine patch while he is in the hospital.  TOTAL TIME SPENT ON THIS ADMISSION: 50 minutes.   ____________________________ Ceasar Lund Anselm Jungling, MD vgv:dp D: 07/29/2013 15:27:34 ET T: 07/29/2013 15:42:55 ET JOB#: [id number]  cc: Ceasar Lund. Anselm Jungling, MD, <Dictator> Vaughan Basta MD ELECTRONICALLY SIGNED 08/07/2013 22:59

## 2015-02-15 NOTE — Discharge Summary (Signed)
PATIENT NAME:  Brent Byrd, Brent Byrd MR#:  [id number] DATE OF BIRTH:  [birth year]  DATE OF ADMISSION:  02/25/2013 DATE OF DISCHARGE:  02/28/2013  ADMITTING DIAGNOSIS: Shortness of breath, likely due to aspiration pneumonia.   DISCHARGE DIAGNOSES:  1. Acute respiratory distress due to chronic obstructive pulmonary disease exacerbation.  2. Acute bronchitis.  3. Mild hypoxia, relatively hypoxic.  4. Cardiomyopathy with ejection fraction of 45% to 50%.  5. Severe emphysema on CT of chest.   6. Ongoing tobacco as well as cocaine abuse.  7. Hypertension.  8. Severe allergy to IODINATED INTRAVENOUS CONTRAST. The patient should not receive IV dye in the future.    DISCHARGE CONDITION: Stable.   DISCHARGE MEDICATIONS: The patient is to continue omeprazole 20 mg p.o. once daily.  NEW MEDICATIONS:  1. Combivent CFC-free 100/20 two puffs twice daily.  2. DuoNebs 2.5/0.5 mg in 3 mL inhalation solution, 3 mL 4 times daily as needed.  3. Prednisone taper 50 mg p.o. once on 03/01/2013, then taper by 10 mg every 2 days.  4. Zithromax 250 mg p.o. once daily for 4 more days.  5. Lisinopril 10 mg p.o. twice daily.  6. Aspirin 81 mg once daily.  7. Budesonide/formoterol 160/4.5 mcg 2 puffs twice daily.  8. Tiotropium 1 capsule once daily.  9. Nicotine transdermal patch 14 mg topically daily.  10. Nicotine oral inhaler 10 mg every 2 hours as needed.   HOME OXYGEN: None.   DIET: 2 gram salt, low fat, low cholesterol, regular consistency.   ACTIVITY LIMITATIONS: As tolerated.   FOLLOWUP APPOINTMENT: With [facility] in 2 days after discharge. The patient is recommended to also undergo cardiac stress test as outpatient when his lung condition has improved.   CONSULTANT: Care management.   RADIOLOGIC STUDIES:  1. Chest x-ray, portable single view 02/24/2013, showed right lower lobe pneumonia. Mild cardiomegaly but no CHF. Repeated chest x-ray 02/26/2013, PA and lateral, showed no focal pneumonia. Mildly  increased interstitial markings persist in both lungs.  2. CT scan of chest with and without contrast 02/27/2013 showed impression: The patient experienced sneezing after contrast injection indicating possible breakthrough of hypersensitivity reaction despite premedication. It is a concern the patient should not receive IODINATED INTRAVENOUS CONTRAST on future studies. It should be noted in the patient's medical chart. Severe emphysematous lung disease was noted. No infiltrate, effusion or pneumothorax. No definite adenopathy. Visualized pulmonary arteries do not show embolism; however, the patient's study is incomplete due to incomplete evaluation due to that sneezing episode.  3. Echocardiogram done on 02/26/2013 showed ventricular ejection fraction decreased by visual estimation 45% to 50%. Mildly decreased global left ventricular systolic function. Moderate to severely increased left ventricular internal cavity size. Impaired relaxation pattern of left ventricular diastolic filling. Mild left ventricular hypertrophy. Mild mitral valve regurgitation. Mild to moderate aortic regurgitation. Aortic root dilatation. Mild to moderate tricuspid regurgitation. Moderately increased left ventricular posterior wall thickness. Read by Dr. Saralyn Pilar.   HOSPITAL COURSE: The patient is a 59 year old African American male with history of tobacco as well as cocaine abuse who presented to the hospital on 02/24/2013 with complaints of shortness of breath as well as chest pressure. Please refer to Dr. Chana Bode Patel's admission note on 02/24/2013. On arrival to the hospital, the patient's temperature was 97.5, pulse was 86, respiratory rate was 22, blood pressure 149/91, saturation was 96% on 2 liters of oxygen through nasal cannula. Physical exam revealed right base rhonchi. No wheezing. No accessory muscle usage. Otherwise, physical exam was unremarkable.  The patient's EKG done in the Emergency Room on the day of admission  revealed normal sinus rhythm at 64 beats per minute. Septal infarct, age undetermined. ST-T wave abnormality consistent with lateral ischemia according to EKG criteria. T inversions were noted in lateral leads as well. The patient's lab data showed low sodium at 135. Otherwise, BMP was unremarkable. The patient's alcohol level was 137. The patient's liver enzymes revealed elevation of total protein of 8.6. Otherwise, liver enzymes were unremarkable. The patient's cardiac enzymes x3 were within normal limits; however, the patient's CK total was elevated at 561 on the first study, 394 on the second and 297 on the third study. Urine drug screen was positive for cocaine. CBC was within normal limits with white blood cell count 8.7, hemoglobin 14.5, platelet count 215. Coagulation panel was unremarkable. Blood cultures taken on 02/24/2013 showed no growth. The patient was admitted to the hospital. He was started on antibiotic therapy because of presumed pneumonia as his first chest x-ray done on admission revealed possible pneumonia; however, the patient did not improve. In fact, his condition worsened over a period of time. He was not able to breathe, and he was relatively hypoxic with O2 sats around the 90s on room air. Then, the patient had repeated chest x-ray which was completely clear and because of concerns of possible pulmonary embolism, he had also CT scanning of chest done on 02/27/2013. It was planned initially to have CT scan with IV contrast, and the patient was premedicated for IV dye; however, during episode of IV dye injection, the patient sneezed, and there was concern that the patient could have had breakthrough allergy and the radiologist felt the patient will not be able to receive IV dye in future studies. The study was finished with no contrast, and unfortunately, they were not able to say much more about pulmonary embolism, however, significant and severe emphysematous changes of the  lungs were  noted. It was felt the patient has bronchitis as well as COPD exacerbation. The patient was initiated on steroids and inhalation therapy. With this, his condition significantly improved, and even over the next 24 hours, he turned around and he was able to be weaned off oxygen. He was ambulated around the nursing station on room air, and his O2 sats dipped down to lowest level, according to the patient, 93%. He is to continue steroid tapering at home as well as antibiotic therapy and inhalation therapy. He is to follow up with his primary care physician for further recommendations. He was also advised about nicotine cessation. Nicotine replacement therapy was initiated for him upon discharge. He also had discussions about cocaine use, especially smoking which could exacerbate his COPD. His echocardiogram revealed mild abnormalities in his ejection fraction which were not felt to be possibly related to cocaine abuse. As the patient has significant shortness of breath, we were not able to get a stress test; however, it was felt that the patient's stress test should be done as outpatient, and it was felt that the patient's chest discomfort, which he had on admission, was very likely due to work of breathing musculoskeletal as his  CK levels were elevated and they improved and chest discomfort improved with improvement of his lung condition. The patient was initiated on ACE inhibitor and Norvasc was completely discontinued. With this, his condition overall improved and his shortness of breath improved as well. It is, however, recommended to get a stress test as outpatient because of abnormal EKG, as well as  hypertension and history of tobacco and cocaine abuse. On the day of discharge, the patient felt satisfactory, did not complain of any significant discomfort. His vitals were stable with temperature of 97.8, pulse 60s to 70s, respiratory rate was 20, blood pressure 277 to 824 systolic and 23N to 36R diastolic and  oxygen O2 sats were 93% to 96% on room air at rest as well as on exertion.   TIME SPENT: 40 minutes.    ____________________________ Theodoro Grist, MD rv:gb D: 02/28/2013 19:13:56 ET T: 03/01/2013 01:41:07 ET JOB#: [id number]  cc: Theodoro Grist, MD, <Dictator> [facility] MD ELECTRONICALLY SIGNED 03/14/2013 7:12

## 2015-02-15 NOTE — Consult Note (Signed)
PATIENT NAME:  Brent Byrd, Brent Byrd MR#:  [id number] DATE OF BIRTH:  1956/04/12  DATE OF CONSULTATION:  07/30/2013  REFERRING PHYSICIAN:   CONSULTING PHYSICIAN:  Joyelle Siedlecki K. Franchot Mimes, MD  DATE OF DICTATION:  07/30/2013   PLACE OF DICTATION:  [place], room #233, [place], [place].    DATE OF ADMISSION:  07/29/2013    AGE:  59 years    SEX:  Male   RACE:  African American   SUBJECTIVE:  The patient was seen in consultation in room #233.  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  The patient is married for 1 year and have been together 7 years and has been separated and living in a motel.  The patient comes for inpatient hospitalization to [facility].    CHIEF COMPLAINT:  "I've been drinking so much, and I want to get over this and I want to get help."   HISTORY OF PRESENT ILLNESS:  The patient reported that he has been drinking at the rate of one fifth, a pint or half gallon of gin per day since age 4 years and has been drinking continuously.  Had been in alcohol programs on 2 occasions.  Has been to [place].  Has been to [facility].  Each time, he stayed sober for 2 days after discharge from the program and started drinking again for no specific stress.  No history of suicide attempts.  Not being followed by any psychiatrist.    MENTAL STATUS EXAM:  The patient is dressed in hospital clothes.  Alert and oriented.  Calm, pleasant and cooperative.  No agitation.  Cognition is intact.  He knew the capitol of [place] and [place].  He new the name of the current President.  Affect is appropriate with his mood which is low and down about him drinking alcohol on a continuous basis and not able to quit the same and wanting to get help to do the same.  No psychosis.  Denies auditory or visual hallucinations.  Cognition is intact. Memory is intact.  He denies any  suicidal or homicidal plans and wants to get help.  Insight and judgment guarded.    IMPRESSION:  Alcohol dependence, chronic, continuous; substance-induced mood disorder.    RECOMMENDATIONS:  Continue CIWA protocol.  Discussed with the staff nurse.  The patient is interested in going for long-term residential program where he will be in a structured environment and gets help for his alcohol program, and he does not want to go to an outpatient program as he feels that that does not help him.   ____________________________ Wallace Cullens. Franchot Mimes, MD skc:gb D: 07/30/2013 21:23:28 ET T: 07/31/2013 00:35:31 ET JOB#: [id number]  cc: Arlyn Leak K. Franchot Mimes, MD, <Dictator> Dewain Penning MD ELECTRONICALLY SIGNED 07/31/2013 15:57

## 2015-02-15 NOTE — Discharge Summary (Signed)
PATIENT NAME:  Brent Byrd, Brent Byrd MR#:  [id number] DATE OF BIRTH:  07/09/1956  DATE OF ADMISSION:  07/29/2013 DATE OF DISCHARGE:  08/01/2013  ADMISSION DIAGNOSIS: Chest pain.   DISCHARGE DIAGNOSES: 1.  Chest pain, atypical in nature.  2.  Elevated D-dimer secondary to an anterior tibial vein nonoccluded thrombus.  3.  Alcohol dependence.  4.  Hypertension.   CONSULTATIONS: Vascular surgery.   IMAGING: The patient had a lower extremity Doppler on the day of admission which showed an anterior left tibial vein thrombosis. He had a repeat lower extremity Doppler 08/01/2013 that showed nonocclusive thrombus in the collateral vein in the lower extremity, anterior tibial vein distally.  Follow-up serial ultrasound to document stability. No deep vein thrombosis.   V-Q scan was negative for PE.   ANA panel was negative.   Discharge white blood cells 4.4, hemoglobin 13.2, hematocrit 39, platelets are 124.   Troponins x 3 were negative.   HOSPITAL COURSE:  A 59 year old male with a history of alcohol dependence who presented with chest pain and found to have elevated d-dimer. For further details, please refer to the H and P.   1.  Chest pain. The patient was admitted to telemetry. He was chest pain-free throughout the hospitalization. His troponins were all negative. His D-dimer was elevated. He had lower extremity Doppler that showed nonocclusive thrombus in the left anterior tibial vein. His V-Q scan was negative. He had repeat lower extremity Doppler which showed stability of the nonocclusive thrombus in the left anterior tibial pain. He was referred to [facility], vascular surgery as an outpatient for repeat Dopplers on 08/04/2013 and a repeat one following that since he plans to move to [place]. We do appreciate Dr. Bunnie Domino consult here in the hospital. The patient will be discharged with aspirin. No indication for anticoagulation at this time. If the deep vein thrombosis does move upward, he will  likely need an IVC filter due to his history of alcohol dependence.  2.  Gout flare. The patient has left toe podagra and left knee pain. He has been on prednisone for 5 days.  3.  History of chronic obstructive pulmonary disease.  No acute issues.  4.  Cocaine abuse. The patient was counseled to stop using illicit drugs.  5.  Alcohol dependence. The patient was on CIWA protocol here.  He did receive some Ativan.  He did want inpatient rehab stay case management and psychiatry was consulted. Due to his medical issues of the lower extremity Doppler, he is unable to go to inpatient care facility at this time.  He was referred there by the case manager and he himself will make arrangements after his medical issues with his lower extremity anterior tibial vein thrombus has been completed.  6.  History of hypertension. The patient needs outpatient medications.   DISCHARGE MEDICATIONS: 1.  Aspirin 325 mg daily.  2.  Combivent 2 puffs b.i.d.  3.  Tiotropium 18 mcg daily.  4.  Trazodone 50 mg at bedtime.  5.  Budesonide formoterol 2 puffs b.i.d.  6.  Prednisone 50 mg for 5 days.  7.  Nicotine patch 14 mg per 24 hours.  8.  Norvasc 10 mg daily.  9.  Omeprazole 20 mg daily.  10.  Multivitamin 1 tablet daily.   DISCHARGE DIET: Regular low sodium diet.   DISCHARGE ACTIVITY: No exertion or heavy lifting.   DISCHARGE REFERRAL:  [facility].   FOLLOW-UP:  The patient will follow up on 08/04/2013 with [facility]  surgery.  A  referral has been made.  The office will be calling him with the phone number he provided me that I provided to the [facility]. He will need follow-up Dr. Gala Lewandowsky in 4 weeks.   The patient is medically stable for discharge.   TIME SPENT: Approximately 35 minutes on this discharge.    ____________________________ Aislin Onofre P. Benjie Karvonen, MD spm:dp D: 08/01/2013 12:08:36 ET T: 08/01/2013 12:20:03 ET JOB#: [id number]  cc: Diannia Hogenson P. Benjie Karvonen, MD,  <Dictator> Dr. Gala Lewandowsky Romond Pipkins P Yarimar Lavis MD ELECTRONICALLY SIGNED 08/01/2013 20:49

## 2015-02-15 NOTE — H&P (Signed)
PATIENT NAME:  Brent Byrd, Brent Byrd MR#:  [id number] DATE OF BIRTH:  06/07/1956  DATE OF ADMISSION:  05/31/2013  REFERRING PHYSICIAN: Emergency Room MD  ATTENDING PHYSICIAN: Orson Slick, MD   IDENTIFYING DATA: Brent Byrd is a 59 year old male with history of alcoholism.   CHIEF COMPLAINT:  "I need to stop."   HISTORY OF PRESENT ILLNESS: Brent Byrd has a long history of alcoholism but had been sober for 5 years until March 2014 when we lost power in our area. He was home alone and started drinking with a neighbor. He was unable to stop ever since. He is drinking beer, liquor, wine, anything he can get. He also uses crack and other drugs when available. He is the father of 2 children, ages 43 and 9.  Due to alcoholism, he stopped taking care of his kids and was unable to play with them or take them out to a playground as he was drunk all the time. His wife, who also is an alcoholic, left him with the children in June.  Since then, she completed alcohol detox and rehab and is living in [place] in a halfway house.  As this was happening, the patient attempted suicide by overdose on 50 pills of different medications, including blood pressure-lowering drugs. He was not hospitalized. He slept it off. He has been in touch with his wife who will take him back if he is clean. He came to the hospital asking for detox. He hopes to go to a 90-day program following discharge. In spite of suicide attempt, he denies any symptoms of depression or anxiety. There are no symptoms suggestive of bipolar mania. There are no psychotic symptoms.   PAST PSYCHIATRIC HISTORY: He has been to alcohol rehab before. He denies psychiatric hospitalizations, treatment with psychotropic medications or suicide attempt.   FAMILY PSYCHIATRIC HISTORY: None reported.   PAST MEDICAL HISTORY: Hypertension, GERD.   ALLERGIES: IODINATED CONTRAST.  MEDICATIONS ON ADMISSION: Norvasc, omeprazole.   SOCIAL HISTORY: He used to work as an  Cabin crew. He is on Disability for back injury. He is married, has 2 children ages 7 and 60. He lives in [place] now but feels that he needs to move out of the area in order to maintain sobriety.   REVIEW OF SYSTEMS:   CONSTITUTIONAL: No fevers or chills. No weight changes.  EYES: No double or blurred vision.  ENT: No hearing loss.  RESPIRATORY: No shortness of breath or cough.  CARDIOVASCULAR: No chest pain or orthopnea.  GASTROINTESTINAL: No abdominal pain, nausea, vomiting or diarrhea.  GENITOURINARY: No incontinence or frequency.  ENDOCRINE: No heat or cold intolerance.  LYMPHATIC: No anemia or easy bruising.  INTEGUMENTARY: No acne or rash.  MUSCULOSKELETAL: No muscle or joint pain.  NEUROLOGIC: No tingling or weakness.  PSYCHIATRIC: See history of present illness for details.   PHYSICAL EXAMINATION: VITAL SIGNS: Blood pressure 123/82, pulse 75, respirations 20, temperature 99.5.  GENERAL: This is a well-developed male in no acute distress.  HEENT: The pupils are equal, round and reactive to light. Sclerae are anicteric.  NECK: Supple. No thyromegaly.  LUNGS: Clear to auscultation. No dullness to percussion.  HEART: Regular rhythm and rate. No murmurs, rubs or gallops.  ABDOMEN: Soft, nontender, nondistended. Positive bowel sounds.  MUSCULOSKELETAL: Normal muscle strength in all extremities.  SKIN: No rashes or bruises.  LYMPHATIC: No cervical adenopathy.  NEUROLOGIC: Cranial nerves II through XII are intact.   LABORATORY DATA: Chemistries within normal limits. Blood alcohol level is 0.134. LFTs within  normal limits except for AST of 46. TSH 0.798. A urine tox screen positive for cocaine. CBC within normal limits. Urinalysis is not suggestive of urinary tract infection, with 1+ leukocyte esterase and 12 white blood cells per field.   MENTAL STATUS EXAMINATION ON ADMISSION: The patient is alert and oriented to person, place, time, and situation. He is pleasant, polite and  cooperative. He is wearing hospital scrubs. He is marginally groomed. He maintains good eye contact. His speech is soft. Mood is depressed with flat affect. Thought processing is logical and goal oriented. Thought content: He denies suicidal or homicidal ideation but recently attempted suicide by medication overdose. There are no delusions or paranoia. There are no auditory or visual hallucinations. His cognition is grossly intact. He registers 3 out of 3 and recalls 3 out of 3 objects after 5 minutes. He can spell world forward and backward. He knows the current president. His insight and judgment are questionable.   SUICIDE RISK ASSESSMENT ON ADMISSION: This is a patient with a lifelong history of alcoholism but substantial periods of sobriety who became suicidal when relapsed on alcohol and his wife left him. He came to the hospital asking for help.   INITIAL DIAGNOSES:  AXIS I: Alcohol dependence.   AXIS II: Deferred.   AXIS III: 1.  Hypertension.  2.  Gastroesophageal reflux disease.   AXIS IV: Substance abuse, marital problems.   AXIS V: Global Assessment of Functioning score on admission 35.   PLAN: The patient was admitted to [place] unit for safety, stabilization and medication management. He was initially placed on suicide precautions and was closely monitored for any unsafe behaviors. He underwent full psychiatric and risk assessment. He received pharmacotherapy, individual and group psychotherapy, substance abuse counseling, and support from therapeutic milieu.   1.  Suicidal ideation: The patient denies. 2.  Alcohol detox: He is on the CIWA protocol. We will monitor for symptoms of alcohol withdrawal.  3.  Substance abuse treatment: The patient would like to participate in a rehab program.  4. Mood:  He denies depressive symptoms and does not wish to start an antidepressant.     DISPOSITION: He will be discharged to home or a  treatment program, if possible.   ____________________________ Wardell Honour. Bary Leriche, MD jbp:[id number] D: 06/01/2013 14:59:19 ET T: 06/01/2013 16:45:02 ET JOB#: [id number]  cc: Lonita Debes B. Bary Leriche, MD, <Dictator> Clovis Fredrickson MD ELECTRONICALLY SIGNED 06/02/2013 6:39

## 2015-02-16 NOTE — H&P (Signed)
PATIENT NAME:  Brent Byrd, Brent Byrd MR#:  [id number] DATE OF BIRTH:  07/27/1956  DATE OF ADMISSION:  03/26/2014  REASON FOR ADMISSION: Suicide attempt, multi medication overdose.   PRIMARY CARE PHYSICIAN: Dr. Delight Stare  REFERRING PHYSICIAN: Dr. Francene Castle  HISTORY OF PRESENT ILLNESS: This is a very nice, 59 year old gentleman who was recently hospitalized here for chest pain, back in April. At that moment, the patient was doing well, was discharged without any complications. Today he comes back again with a history of having been drinking and thinking about his wife, who is separated from him. They have marital problems, and there was a discussion earlier today, and the patient took multiple medications, about 40 pills with the purpose of taking his life. The patient states that today he is drinking at least three 24-ounce beers, and he called himself the EMS after he did his overdose, asking for rehabilitation. Overall, he has been stable. He states that he took cholesterol pills. He is not taking any blood pressure medications since the beginning of the year. He has recently been diagnosed with a deep vein thrombosis, and he had 7 Xarelto left in the bottle, and he took them all. The patient does not have any significant bleeding, chest pain or shortness of breath. The patient is not very cooperative, as he looked very depressed. He states that he is not quite sure if he is depressed, but he has been sad for a while, since his wife left.  REVIEW OF SYSTEMS: CONSTITUTIONAL: No fever, fatigue, weakness.  EYES: No blurry vision, double vision.  EARS, NOSE, THROAT: No difficulty swallowing or tinnitus.  RESPIRATORY: No shortness of breath, cough or wheezing. The patient is a smoker. Smoking cessation counseling given to the patient for over four minutes. The patient is not ready to quit smoking. No cough, wheezing, or hemoptysis at this moment.  CARDIOVASCULAR: No chest pain, orthopnea or palpitations.   GASTROINTESTINAL: No nausea, vomiting or diarrhea. The patient was nauseated earlier, but now it has resolved.  GENITOURINARY: No dysuria, hematuria, changes in frequency. Male genitourinary. No prostatitis or sexually transmitted diseases.  ENDOCRINE: No polyuria, polydipsia, polyphagia.  HEMATOLOGIC AND LYMPHATIC: No anemia, easy bruising or swollen glands. The patient has a history of deep vein thrombosis. No active bleeding.  SKIN: No rashes or petechiae.  MUSCULOSKELETAL: No significant neck pain. Positive chronic back pain, which is controlled.  NEUROLOGIC: No numbness, tingling or vertigo.  PSYCHIATRIC: Signs of depression, mild insomnia, alcohol abuse.   PAST MEDICAL HISTORY: 1.  Hypertension.  2.  Deep vein thrombosis.  3.  Hyperlipidemia.  4.  GERD.    ALLERGIES: CONTRAST DYE.   SOCIAL HISTORY: The patient is a smoker, 1 pack a day for over 50 years, since the age of 89. He drinks daily, up to 10 drinks, usually beers, sometimes 24-ounce beers. He denies any drug use. He lives by himself. He has been separated from his wife for one year and he is unemployed.   FAMILY HISTORY: Positive for diabetes, hypertension in his sister. His father had lung cancer. No MIs in the family.   PAST SURGICAL HISTORY: Multiple back surgeries, 2 or 3; the patient cannot tell me how many or what they were for.   MEDICATIONS: The patient is not sure about what medications he is taking. At this moment, were are not able to get the pharmacy, but previous discharge says that the patient was taking only omeprazole and aspirin. The patient has been recently started on Xarelto  due to a deep vein thrombosis.   PHYSICAL EXAMINATION: VITAL SIGNS: Blood pressure 122/80, pulse 76, respirations 19, temperature 98.7.  GENERAL: The patient is alert, oriented x3. No acute distress. No respiratory distress. Hemodynamically stable.  HEENT: Pupils are equal and reactive. Extraocular movements are intact. Mucosa are  moist. Anicteric sclerae. Pink conjunctivae. No oral lesions. No oropharyngeal exudates.  NECK: Supple. No JVD. No thyromegaly. No adenopathy. No carotid bruits.  CARDIOVASCULAR: Regular rate and rhythm. No murmurs, rubs or gallops. No displacement of PMI. No tenderness to palpation, anterior chest wall.  LUNGS: Clear without any wheezing or crepitus. No use of accessory muscles.  ABDOMEN: Soft, nontender, nondistended. No hepatosplenomegaly. No masses. Bowel sounds are positive.  EXTREMITIES: No edema, cyanosis or clubbing. Pulses +2. Capillary refill less than 3.  NEUROLOGIC: Cranial nerves II through XII intact. Strength is equal in all four extremities. No focal findings.  PSYCHIATRIC: The patient has a very depressed affect. No hallucinations. No delusions. Looks depressed. The patient states that he did what he did today because he has been drinking and he did not think about the consequences. He states that, at this moment, he does not have any suicidal ideations or plans, although he wants some help. No agitation.  SKIN: The rashes or petechiae. LYMPHATIC: Negative for lymphadenopathy in the neck or supraclavicular areas.  MUSCULOSKELETAL: No significant joint effusions or swelling.   DIAGNOSTIC DATA: Laboratory is mostly unremarkable. Glucose 74, BUN 7, creatinine 0.8. Electrolytes were within normal limits. Alcohol level 0.083. LFTs with normal limits. TSH 1.42. White count 4.7, hemoglobin 14.7. INR is 1. Salicylate level was 5.4, and his Tylenol level less than 2. EKG: No ST depressions or elevations, normal sinus rhythm, mild criteria for LDH.   ASSESSMENT AND PLAN: This is a 59 year old gentleman with history of hypertension and gastroesophageal reflux disease, who comes today with multidrug overdose, over 40 to 50 pills. Seven of them were Xarelto. The rest were cholesterol medications and gastroesophageal reflux disease medications, apparently. We are not certain about all the drugs that  he took. At this moment, the patient is stable.   1.  Drug overdose with suicidal attempt. The patient took at least 7 Xarelto, that is what we are certain that he took. His INR right now is normal. We are going to monitor closely with telemetry. At this moment, I do not think that he needs to go to the critical care unit, but if there are any signs of bleeding, he needs to be transferred. We are going to check his hemoglobin periodically to evaluate for any bleeding, monitor his stool guaiacs also for any bleeding. Keep him under telemetry monitoring. The patient also took some cholesterol medications. We are going to check liver toxicity with the next set of labs. Salicylate level is slightly elevated, and the patient is at risk of bleeding. He has been started on ibuprofen here at the ER, which we are going to stop. Since the patient looks depressed, we are going to start citalopram and monitor its effects.  2.  Alcohol abuse. The patient is a heavy drinker. We are going to put him on CIWA protocol. 3.  Smoking. Chronic smoking cessation given to the patient. Nicotine patch add on as needed. 4.  Other medical problems are stable. For gastroesophageal reflux disease, we are going to do Zantac.   I spent about 45 minutes with this patient.   CODE STATUS: Full code.   Psychiatric consultation obtained, 24 hour sitter, as the  patient was suicidal, no longer suicidal, but we are going to do suicide precautions.     ____________________________ Sikes Sink, MD rsg:cg D: 03/26/2014 23:25:09 ET T: 03/27/2014 00:27:54 ET JOB#: [id number]  cc: Tampico Sink, MD, <Dictator> Jiovany Scheffel America Brown MD ELECTRONICALLY SIGNED 03/31/2014 20:11

## 2015-02-16 NOTE — H&P (Signed)
PATIENT NAME:  Brent Byrd, Brent Byrd MR#:  [id number] DATE OF BIRTH:  May 01, 1956  DATE OF ADMISSION:  02/16/2014  PRIMARY CARE PHYSICIAN: Marguerita Merles, MD  EMERGENCY ROOM PHYSICIAN: Latina Craver, MD   CHIEF COMPLAINT: Chest pain.   HISTORY OF PRESENT ILLNESS: A 59 year old male patient with history of hypertension, now came in because of chest pain. The patient started to have chest pain since 2 days. The patient says that mainly the chest pain is on the left side, going to the left arm, associated with some dizziness. The patient also has some cough and white phlegm, which is going on for 3 days. No aggravating or relieving factors for chest pain. Chest pain is in the middle of the chest and crushing type of pain, 8 out of 10 in severity. The patient also complains of numbness in the left hand for about a week. Denies any blurred vision. No weakness in hand, the right hand, or both legs. Denies any speech troubles. No headache.   PAST MEDICAL HISTORY: Significant for history of hypertension before, but taken off the medications by primary doctor.  ALLERGIES: SHE IS ALLERGIC TO CONTRAST DYE.   SOCIAL HISTORY: Smokes every day, about 1 pack, smoking for about 50 years. Drinks occasionally.   FAMILY HISTORY: Significant for diabetes. No premature history of heart disease.   PAST SURGICAL HISTORY: Significant for history of back surgeries before.   MEDICATIONS: The patient is not taking any medications. The only medicine he takes is omeprazole 20 mg daily. He is not taking inhalers. He is not taking aspirin.   REVIEW OF SYSTEMS:    CONSTITUTIONAL: No fever. No fatigue. No weakness.  EYES: No blurred vision.  EARS, NOSE, THROAT: No tinnitus. No epistaxis. No difficulty swallowing.  RESPIRATORY: Does have some cough and some trouble breathing, but no wheezing.  CARDIOVASCULAR: Left-sided chest pain present. No orthopnea. No PND. No palpitations.  GASTROINTESTINAL: No nausea. No vomiting. No  abdominal pain.  GENITOURINARY: No dysuria.  ENDOCRINE: No polyuria or nocturia.  HEMATOLOGIC: No anemia or easy bruising.  INTEGUMENTARY: No skin rashes.  MUSCULOSKELETAL: The patient complains of back pain.  NEUROLOGIC: Has numbness in the left hand all the way from shoulder to the fingers. The patient complains of numbness mainly on the lateral aspect. No weakness in hands or legs. No dementia. No headaches. No history of TIA. PSYCHIATRIC: No anxiety or insomnia.   PHYSICAL EXAMINATION: VITAL SIGNS: Temperature 98.6, heart rate 86, blood pressure 122/79. Sats 89% on room air when he came, but during my visit, he was on room air and saturation was 95%.  GENERAL: Alert, awake, oriented, 59 year old male. Complains of numbness in the hand and also multiple nonspecific complaints.  HEAD: Atraumatic, normocephalic.  EYES: Pupils equally reacting to light. No conjunctival pallor. No scleral icterus. Extraocular movements intact.  NOSE: No drainage. No external lesions.  MOUTH: No lesions.  NECK: Supple. No JVD. No carotid bruit. Thyroid in the midline. Normal range of motion.  RESPIRATORY: Clear to auscultation. No wheeze, no rales.  CARDIOVASCULAR: S1, S2 regular. No murmurs. PMI is not displaced. The patient does have some chest wall tenderness.  GASTROINTESTINAL: Abdomen is soft, nontender, nondistended. Bowel sounds present.  SKIN: Warm and dry.  LYMPH: No lymphadenopathy in axilla or cervical regions.  NEUROLOGIC: The patient does have numbness on the left upper extremity, decreased sensation to touch, but the power is intact in both upper and lower extremities. DTRs 2+ bilaterally, upper and lower extremities. The patient  is awake, alert and oriented. Speech is clear. No slurred speech. No facial droop.  PSYCHIATRIC: Mood and affect are within normal limits.   LABORATORY, DIAGNOSTIC, AND RADIOLOGICAL DATA: Sodium 143, potassium 3.3, chloride 110, bicarbonate 24, BUN 8, creatinine 0.95,  glucose 73. White count is 3.4, hemoglobin 14.1, hematocrit 42, platelets 168. Troponin less than 0.02. The patient's CK total is 574, CPK-MB 4.5. The patient's chest x-ray shows bibasilar atelectasis. Head CT negative for any acute changes. EKG shows T wave inversion in V4, V5, V6.   ASSESSMENT AND PLAN: This is a 59 year old male patient with:  1.  Chest pain, EKG changes, concern about cardiac event. Troponins are negative, but CK is elevated. We are going to continue aspirin, beta blockers, statins, and a small dose of nitroglycerin and continue to cycle troponins 2 more times and see, and I have ordered stress test for tomorrow morning.  2.  Possible musculoskeletal pain and rhabdomyolysis. The patient's CK is elevated, having some muscle tenderness, so we are going to continue fluids, check the CK.  3.  History of smoking, continues to smoke. Counseled him about smoking cessation for 5 minutes.  4.  Left-sided weakness, mainly in the upper extremity. The patient's CT head is negative. It is probably related to his work. 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 At this time, we will hold off on invasive intervention, especially because his other neurological exam is normal. We will check the B12 levels and also check the thyroid levels.   This is an observation history and physical.  TIME SPENT: 50 minutes.    ____________________________ Epifanio Lesches, MD sk:jcm D: 02/16/2014 17:07:24 ET T: 02/16/2014 19:26:02 ET JOB#: [id number]  cc: Epifanio Lesches, MD, <Dictator> Epifanio Lesches MD ELECTRONICALLY SIGNED 03/20/2014 19:10

## 2015-02-16 NOTE — H&P (Signed)
PATIENT NAME:  Brent Byrd, Brent Byrd MR#:  [id number] DATE OF BIRTH:  March 28, 1956  DATE OF ADMISSION:  07/30/2014  DATE OF ASSESSMENT: 08/01/2015  REFERRING PHYSICIAN: Emergency Room MD  ATTENDING PHYSICIAN: Orson Slick, MD   IDENTIFYING DATA: Brent Byrd is a 59 year old male with history of depression, mood instability, alcoholism and cocaine addiction.   CHIEF COMPLAINT: "I'm suicidal."   HISTORY OF PRESENT ILLNESS: Brent Byrd has been struggling with substance abuse for a long time. His last admission to [facility] facility was in June. He hardly maintained any sobriety, but stayed in [place] area at [facility]. Two weeks ago, he relapsed on substances again, spent all his paycheck on drugs, is currently homeless and unable to take care of himself. He has not been compliant with his follow up with a psychiatrist or medications for depression and endorses worsening of depression with poor sleep, decreased appetite, anhedonia, feeling of guilt, hopelessness, worthlessness, poor energy and concentration, crying spells, social isolation, and now suicidal ideation without a plan. He feels that he has no support. He wants to get away from everybody he knows and who is using drugs, including his wife, and go to a long-term program. He is on probation and I am not sure if this is allowed for him to go out of state, which he intends to do. His drug of choice is alcohol and cocaine. He was placed on the CIWA protocol. He does not have appreciable symptoms of alcohol withdrawal while on CIWA protocol. He denies psychotic symptoms. There are no symptoms suggestive of bipolar mania.   PAST MEDICAL HISTORY: GERD.   ALLERGIES: IODINATED CONTRAST.   MEDICATIONS ON ADMISSION: None.   PAST PSYCHIATRIC HISTORY: Numerous hospitalizations for detoxification and alcohol rehab at [facility], [facility] and other facilities. He has not been able to maintain sobriety for long. He has a history of depression for which he was  prescribed Celexa and trazodone in the past, but he never follows up with his outpatient providers.   FAMILY PSYCHIATRIC HISTORY: Other family members with alcoholism.   SOCIAL HISTORY: He is disabled. He receives a check. He has Medicare and Medicaid. He is separated from his wife who is also a substance user and up until recently was in rehab herself. He claims to give his wife 400 dollars as a way to pay for his probation officer and child support for his 2 children, ages 44 and 13. He is homeless. He is on probation.   REVIEW OF SYSTEMS: CONSTITUTIONAL: No fevers or chills. No weight changes.  EYES: No double or blurred vision.  ENT: No hearing loss.  RESPIRATORY: No shortness of breath or cough.  CARDIOVASCULAR: No chest pain or orthopnea.  GASTROINTESTINAL: No abdominal pain, nausea, vomiting, or diarrhea.  GENITOURINARY: No incontinence or frequency.  ENDOCRINE: No heat or cold intolerance.  LYMPHATIC: No anemia or easy bruising.  INTEGUMENTARY: No acne or rash.  MUSCULOSKELETAL: No muscle or joint pain.  NEUROLOGIC: No tingling or weakness.  PSYCHIATRIC: See history of present illness for details.   PHYSICAL EXAMINATION: VITAL SIGNS: Blood pressure 141/98, pulse 45, respirations 20, temperature 97.9.  GENERAL: This is a slender tall male in no acute distress.  HEENT: The pupils are equal, round, and reactive to light. Sclerae are anicteric.  NECK: Supple. No thyromegaly.  LUNGS: Clear to auscultation. No dullness to percussion.  HEART: Regular rhythm and rate. No murmurs, rubs, or gallops.  ABDOMEN: Soft and nontender with positive bowel sounds.  MUSCULOSKELETAL: Normal muscle strength in all  extremities.  SKIN: No rashes or bruises.  LYMPHATIC: No cervical adenopathy.  NEUROLOGIC: Cranial nerves II through XII are intact.   LABORATORY DATA: Chemistries are within normal limits. Blood alcohol level 140. LFTs within normal limits. Urine tox screen positive for benzodiazepine  and cocaine. CBC within normal limits. Urinalysis is not suggestive of urinary tract infection. Serum acetaminophen and salicylates are low.   MENTAL STATUS EXAMINATION ON ADMISSION: The patient is alert and oriented to person, place, time and situation. He is pleasant, polite and cooperative. He is well groomed and casually dressed. He maintains good eye contact. His speech is of normal rhythm, rate and volume. Mood is depressed with flat affect. Thought process is logical and goal oriented. Thought content: He denies thoughts of hurting himself or others now, but was admitted after voicing suicidal ideation. There are no delusions or paranoia. There are no auditory or visual hallucinations. His cognition is grossly intact. Registration, recall, short and long-term memory are intact. He is of normal intelligence and fund of knowledge. His insight and judgment are limited.   SUICIDE RISK ASSESSMENT ON ADMISSION: This is a patient with a history of depression untreated, mood instability, cocaine, alcohol and benzodiazepine dependence who came to the hospital asking for detox, homeless and suicidal.   INITIAL DIAGNOSES:  AXIS I:  1.  Major depressive disorder, recurrent, severe.  2.  Alcohol use disorder, severe.  3.  Cocaine use disorder, severe. 4.  Sedative abuse.  AXIS II: Deferred.  AXIS III: Deferred.   PLAN: The patient was admitted to [facility] for safety, stabilization and medication management. He was initially placed on suicide precautions and was closely monitored for any unsafe behavior. He underwent full psychiatric and risk assessment. He received pharmacotherapy, individual and group psychotherapy, substance abuse counseling, and support from therapeutic milieu.  1.  Suicidal ideation. He is able to contract for safety.  2.  Mood. He was restarted on Celexa and trazodone for sleep.  3.  Alcohol detox. He is on the CIWA protocol.  4.   Gastroesophageal reflux disease. He is on pantoprazole. 5.  Substance abuse treatment. He desires long-term treatment program. Social worker will try to assist.  6.  Disposition. To be established.    ____________________________ Wardell Honour. Bary Leriche, MD jbp:sb D: 07/31/2014 14:04:54 ET T: 07/31/2014 14:58:39 ET JOB#: [id number]  cc: Jolanta B. Bary Leriche, MD, <Dictator> Clovis Fredrickson MD ELECTRONICALLY SIGNED 08/02/2014 0:54

## 2015-02-16 NOTE — Discharge Summary (Signed)
PATIENT NAME:  Brent Byrd, Brent Byrd MR#:  [id number] DATE OF BIRTH:  1956/01/21  DATE OF ADMISSION:  03/28/2014 DATE OF DISCHARGE:  Planned for 04/04/2014  HOSPITAL COURSE: See dictated history and physical for details of admission. A 59 year old gentleman with a history of depression and alcohol abuse came into the hospital requesting that he be allowed to go to a longer term rehab program. He was having suicidal thoughts, felt very depressed and down and hopeless. Had been back to drinking again, not working, not functioning well. In the hospital, he was treated for alcohol withdrawal, which he went through easily without any delirium or seizures. He was engaged in individual and group psychotherapy and also treated with modest doses of antidepressants. He had no complaints of any side effects from his medication and he remained focused on wanting to go to an inpatient rehab program, preferably in [place]. He has now been accepted at [facility] and will be discharged tomorrow morning. He has been accepted there at 8:00. We will try and have everything together for him to go in time. The only new medical problem that came up while he was in the hospital was a complaint about pain in the rectal area. Medicine consultant took a look at this and did not see anything out of the ordinary, so topical cream has been prescribed only.   MENTAL STATUS EXAMINATION AT DISCHARGE: Neatly dressed and groomed gentleman who looks his stated age, cooperative with the interview. Good eye contact. Normal psychomotor activity. Speech normal rate, tone and volume. Affect euthymic, reactive, appropriate. Mood stated as okay. Thoughts are lucid. No loosening of associations or delusions. Denies auditory or visual hallucinations. Denies suicidal or homicidal ideation. Shows good insight and judgment. Normal intelligence. Alert and oriented x 4. Short and long-term memory intact.   DISCHARGE MEDICATIONS: Aspirin, enteric coated, 325 mg once  a day; trazodone 100 mg at night, pantoprazole 40 mg once a day, Celexa 20 mg once a day.   LABORATORY RESULTS: Cholesterol 154, HDL elevated at 68. Generally looks good. CBC unremarkable. PT normal. Urinalysis normal. Drug screen positive for cocaine. Hemoglobin normal. Chemistry panel unremarkable.   DISPOSITION: Discharged with arrangements for him to go to [facility] in [place] for followup rehab treatment.   DIAGNOSIS, PRINCIPAL AND PRIMARY:  AXIS I: Substance-induced mood disorder, resolved.   SECONDARY DIAGNOSES: AXIS I:   Alcohol dependence.                 Cocaine abuse.  AXIS II:  Deferred.  AXIS III: Gastric reflux symptoms.  AXIS IV: Severe from unemployment, wife being in a rehabilitation program, poor social functioning.  AXIS V:  Functioning at time of discharge 55.   ____________________________ Gonzella Lex, MD jtc:ce D: 04/03/2014 16:53:44 ET T: 04/03/2014 18:23:22 ET JOB#: [id number]  cc: Gonzella Lex, MD, <Dictator> Gonzella Lex MD ELECTRONICALLY SIGNED 04/11/2014 13:25

## 2015-02-16 NOTE — Consult Note (Signed)
PATIENT NAME:  YONIEL, ARKWRIGHT MR#:  [id number] DATE OF BIRTH:  1956/03/01  DATE OF CONSULTATION:  07/30/2014  CONSULTING PHYSICIAN:  Gonzella Lex, MD  IDENTIFYING INFORMATION AND REASON FOR CONSULT: A 59 year old man with a history of substance abuse and depression who came into the hospital stating he was having thoughts about killing himself. He has relapsed. Homeless. Unable to get sober. Seeks some kind of treatment and help.   HISTORY OF PRESENT ILLNESS: The patient reports that he has been continuing to abuse drugs and alcohol pretty regularly. He has had very brief periods of a few days when he has been able to stay sober. He has not been able to maintain any stable place to live probably because of his continued substance abuse. He was living at [facility] in [place] until a week or 2 ago but since then has just been going from place to place and living on the street. Mood has been feeling increasingly depressed. Endorses suicidal thoughts without specific plan. Endorses hopelessness, low energy. Poor appetite. Poor sleep. Not taking any medication currently. Says he has been drinking quite a bit. His last drink now was on Sunday evening also using crack up until Saturday.   PAST PSYCHIATRIC HISTORY: Denies any history of suicide attempts. He has been engaged in treatment for his depression but rarely is compliant. Unclear how much of it is directly related to substance abuse. He has recently been noncompliant with both therapy and medication management.   FAMILY HISTORY: Denies any family history of mental illness.   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 He is separated from his wife who had been in a treatment program. He still not in contact with her regularly. Does not have any stable place to stay.   PAST MEDICAL HISTORY: Has a history of a deep venous thrombosis and gastric reflux. Does not seem to still be on his blood  anticoagulant.   SUBSTANCE ABUSE HISTORY: Long history of alcohol abuse and cocaine abuse. No history of DTs. No history of seizures.   CURRENT MEDICATIONS: Not taking any.   ALLERGIES: IODINE CONTRAST.   REVIEW OF SYSTEMS: Depressed mood, hopelessness, suicidal ideation, fatigue, poor sleep, poor appetite. No auditory hallucinations. No other specific physical symptoms. The rest of the 9-category review of systems negative.   MENTAL STATUS EXAMINATION: Disheveled gentleman, looks his stated age. Passively cooperative. Poor eye contact. Psychomotor activity slow. Speech decreased rate, tone, and volume. Affect blunted and depressed. Mood stated as depressed. Thoughts are lucid but slow. No sign of delusional thinking. Denies auditory or visual hallucinations. Denies suicidal intent now, but had suicidal ideation yesterday. No homicidal ideation. Could recall 3/3 objects immediately and at 3 minutes. He is alert and oriented x 4. Judgment and insight adequate. Normal intelligence.   LABORATORY RESULTS: Urinalysis positive for blood. Drug screen is positive for cocaine and benzodiazepines. Salicylates 3.5. Acetaminophen negative. Alcohol level on presentation yesterday 140, chloride slightly up 108. CBC unremarkable.   VITAL SIGNS: Currently blood pressure 106/81, respirations 20, pulse 80, temperature 97.9. The patient able to move all extremities, walk normally, symmetric face.   ASSESSMENT: A 59 year old man with depression related to substance abuse, ongoing alcohol and cocaine abuse, homelessness. Feels unsafe living on the street, wants detox.   TREATMENT PLAN: Admit to the hospital both for detoxification and to stabilize mood as he is reporting recent suicidal ideation. Restart Celexa that had been used in the  past for his depression. Detox orders in place. Trazodone 100 mg at night. Engage him in groups and activities on the unit.   DIAGNOSIS, PRINCIPAL AND PRIMARY:   AXIS I: Major  depression, moderate, recurrent.   SECONDARY DIAGNOSES:  AXIS I:  1. Alcohol abuse.  2. Cocaine abuse.   AXIS II: History of gastric reflux, history of mild arthritis.    ____________________________ Gonzella Lex, MD jtc:lt D: 07/30/2014 13:19:02 ET T: 07/30/2014 14:09:22 ET JOB#: [id number]  cc: Gonzella Lex, MD, <Dictator> Gonzella Lex MD ELECTRONICALLY SIGNED 08/06/2014 0:23

## 2015-02-16 NOTE — Consult Note (Signed)
PATIENT NAME:  Brent Byrd, Brent Byrd MR#:  [id number] DATE OF BIRTH:  21-Feb-1956  DATE OF CONSULTATION:  04/02/2014  REFERRING PHYSICIAN:  Gonzella Lex, MD CONSULTING PHYSICIAN:  Shelia Magallon H. Posey Pronto, MD  PRIMARY CARE PROVIDER: Marguerita Merles, MD  REASON FOR CONSULTATION: Concern for a possible abscess or infected lesion near the rectal area.   HISTORY OF PRESENT ILLNESS: The patient is a 59 year old African American male with history of hypertension, history of DVT, hyperlipidemia, who was hospitalized here on 06/01 with suicidal attempt and overdose on multiple medications. The patient was subsequently transferred to Behavioral [facility]. The patient reports that over the past 2 to 3 days, he has been having pain in the rectal area. Has not had any fevers or chills. Is not having any chest pain or shortness of breath. Denies any nausea, vomiting or diarrhea. He has not noticed any bleeding from the rectal area.   PAST MEDICAL HISTORY: Significant for hypertension, history of DVT, history of hyperlipidemia, GERD.   ALLERGIES: CONTRAST DYE.   SOCIAL HISTORY: The patient is a smoker, 1 pack per day for 50 years. He has a history of drinking heavy, but since he has been in [place] has not been drinking. No drug use.   FAMILY HISTORY: Positive for diabetes, hypertension.   PAST SURGICAL HISTORY: Multiple back surgeries.   CURRENT MEDICATIONS: Celexa 20 daily. He is on Ativan for DT protocol, milk of magnesia 30 mL at bedtime p.r.n. constipation, nicotine patch, Protonix 40 daily, Tylenol 650 q.4 p.r.n., trazodone 100 at bedtime, ibuprofen 600 q.6 p.r.n.   FAMILY HISTORY: Positive for diabetes and hypertension in his sister. Father had lung cancer.  REVIEW OF SYSTEMS:   CONSTITUTIONAL: Denies any fevers, fatigue, weakness.  EYES: No blurred or double vision. EARS, NOSE, AND THROAT: No difficulty swallowing or tinnitus.  RESPIRATORY: No shortness of breath. No cough. No wheezing.   CARDIOVASCULAR: Denies any chest pain, orthopnea, or palpitations.  GASTROINTESTINAL: No nausea, vomiting, diarrhea. No abdominal pain. No hematemesis or hematochezia.  GENITOURINARY: Denies any dysuria or hematuria, frequency or urgency.  ENDOCRINE: Denies any polyuria or nocturia.  MUSCULOSKELETAL: Denies any pain in neck. Has a history of back pain.  NEUROLOGIC: No CVA, TIA, seizures. PSYCHIATRIC: Being admitted for suicidal attempt.   PHYSICAL EXAMINATION: VITAL SIGNS: Temperature 97.8, pulse 66, respirations 20, blood pressure 103/69, O2 sat 98%.  GENERAL: The patient is a well-developed, well-nourished male in no acute issue.  HEENT: Head atraumatic, normocephalic. Pupils equally round, reactive to light and accommodation. There is no conjunctival pallor. No scleral icterus. Nasal exam shows no drainage or ulceration. Oropharynx is clear without any exudate.  NECK: Supple without any JVD.  CARDIOVASCULAR: Regular rate and rhythm. No murmurs, rubs, clicks, or gallops.  LUNGS: Clear to auscultation bilaterally without any rales, rhonchi, wheezing.  ABDOMEN: Soft, nontender, nondistended. Positive bowel sounds x 4.  EXTREMITIES: No clubbing, cyanosis, or edema.  SKIN: No rash.  LYMPHATICS: No lymph nodes palpable.  VASCULAR: Good DP, PT pulses.  PSYCHIATRIC: Not anxious or depressed.  NEUROLOGIC: Awake, alert, oriented x 3. No focal deficits.  GENITOURINARY: Area of external hemorrhoids with area of irritation in the perirectal area. There is no active bleed. No evidence of infection.   ASSESSMENT AND PLAN: The patient is a 59 year old admitted to [facility] for multiple drug overdose, has some pain in the rectal area.  1.  Irritation in the perirectal area: There is no evidence of abscess, no infection. Will place hemorrhoidal cream b.i.d.  No further work-up needed.  2.  Hypertension: Blood pressure is stable. Not on any medication. Monitor blood pressure.  3.   Hyperlipidemia: Will check a fasting lipid panel in the a.m. Not on any treatment. 4.  Gastroesophageal reflux disease: Continue Protonix. 5.  History of deep vein thrombosis: There is no evidence of deep vein thrombosis at this point.   Please call with any questions. Will sign off on this patient.    CONSULT TIME SPENT: 50 minutes.   ____________________________ Lafonda Mosses Posey Pronto, MD shp:jcm D: 04/02/2014 17:25:15 ET T: 04/02/2014 20:28:07 ET JOB#: [id number]  cc: Nelissa Bolduc H. Posey Pronto, MD, <Dictator> Alric Seton MD ELECTRONICALLY SIGNED 04/10/2014 16:57

## 2015-02-16 NOTE — Discharge Summary (Signed)
PATIENT NAME:  Brent Byrd, Brent Byrd MR#:  [id number] DATE OF BIRTH:  December 02, 1955  DATE OF ADMISSION:  02/16/2014 DATE OF DISCHARGE:  02/17/2014  ADMISSION DIAGNOSIS: Chest pain.   DISCHARGE DIAGNOSES:  1. Chest pain, atypical.  2. Left arm numbness, chronic, likely secondary to some nerve impairment.  3. History of tobacco dependence.  4. Mild rhabdomyolysis.   CONSULTATIONS: None.   PROCEDURES: The patient underwent a nuclear medicine stress test which essentially showed a low risk scan with an EF of 56%.   HOSPITAL COURSE: A 59 year old male who presented with chest pain. For further details, please refer to the H and P.   1. Chest pain. The patient was admitted to telemetry. Cardiac enzymes were cycled. His troponins were negative. His CK was slightly elevated secondary to mild rhabdomyolysis. He underwent a nuclear medicine stress test which essentially was negative and had a low risk probability for cardiac ischemia. He was initially placed on metoprolol due to his chest pain. Due to bradycardia, this was stopped. According to the patient he was taken off all hypertensive medications in the past by his PCP.  2. Mild rhabdomyolysis improved CPK. 3. Chronic obstructive pulmonary disease which was stable. 4. Left arm numbness, likely from nerve impingement or compression. The patient will need outpatient followup.   DISCHARGE MEDICATIONS:  1. Omeprazole 20 mg daily.  2. Aspirin 81 mg daily.   DISCHARGE DIET: Low-sodium DISCHARGE FOLLOWUP: The patient will followup with Dr. Delight Stare in 1 week.  The patient is medically stable for discharge.   TIME SPENT: 35 minutes.    ____________________________ Donell Beers. Benjie Karvonen, MD spm:lt D: 02/17/2014 12:42:42 ET T: 02/18/2014 01:37:27 ET JOB#: [id number]  cc: Aleea Hendry P. Benjie Karvonen, MD, <Dictator> Marguerita Merles, MD Donell Beers Analleli Gierke MD ELECTRONICALLY SIGNED 02/18/2014 13:30

## 2015-02-16 NOTE — H&P (Signed)
PATIENT NAME:  FOREST, REDWINE MR#:  [id number] DATE OF BIRTH:  [birth year]  DATE OF ADMISSION:  03/28/2014  IDENTIFYING INFORMATION AND CHIEF COMPLAINT: A 59 year old gentleman with a history of alcohol dependence, who was admitted to the hospital after taking an overdose.   CHIEF COMPLAINT: "I need help."   HISTORY OF PRESENT ILLNESS: Information obtained from the patient and the chart. The patient states that he was at home and had been feeling more depressed recently. Felt like his life was out of control and felt like his drinking had gotten out of control. He has been drinking alcohol heavily, 12 to 24 beers, probably with liquor in addition to that, a day. Recently his wife left him again. He has not been able to get his life stabilized. It does not sound like he has been working regularly. He wants to get back into a rehab program. Most acutely, he took an overdose of medication, probably mostly Xarelto, which he had been prescribed for a blood clot at some point. After taking it, he realized what he had done, and called 911 himself. Since being on the medical floor, he has not shown any attempts to hurt himself or dangerous behavior. He is requesting referral to substance abuse treatment.   PAST PSYCHIATRIC HISTORY: He was admitted to Psychiatry last August, fairly similar circumstances, and at that time was transferred to the alcohol and drug abuse treatment center. He says that after that, he managed to stay sober for a few months before relapsing. He has a history of depression. It is not clear whether antidepressants have ever been consistently helpful for him. He is not getting any current outpatient treatment.   FAMILY HISTORY: Negative.   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 His wife is separated from him again. He is feeling lonely and isolated.   MEDICAL HISTORY: History of a deep venous thrombosis. It  is a little unclear to me from what he is saying whether he is still supposed to be on the Xarelto or not. Also a history of gastric reflux, taking omeprazole.   SUBSTANCE ABUSE HISTORY: Long history of alcohol abuse, it has been worse in recent years. Denies knowing of any history of seizures or DTs coming off of alcohol.   CURRENT MEDICATIONS: He claims all he is taking is omeprazole 40 mg a day.   ALLERGIES: CONTRAST DYE.   REVIEW OF SYSTEMS:  He still complains of feeling sad, but denies suicidal ideation. Denies any auditory or visual hallucinations. Denies any acute pain. His appetite is improving. No cardiac, pulmonary, or GI symptoms.   MENTAL STATUS EXAMINATION: The patient was neatly groomed. Alert, awake and cooperative. Eye contact good. Psychomotor activity a little sluggish. Speech easy to understand, decreased in total amount. Affect blunted. Mood stated as depressed. Thoughts are lucid. No loosening of associations. Denies auditory or visual hallucinations. Denies any acute suicidal or homicidal ideation, or wish to die. Alert and oriented x 4. Short-and long-term memory intact. Normal intelligence.   PHYSICAL EXAMINATION: SKIN: No acute skin lesions identified.  HEENT: Pupils equal and reactive. Face symmetric. Oral mucosa dry.  NECK AND BACK: Nontender.  MUSCULOSKELETAL: Full range of motion at all extremities. Strength and reflexes symmetric and normal throughout.  NEUROLOGIC:  Cranial nerves symmetric and normal. Normal gait.  LUNGS: Clear, with no wheezes.  HEART: Regular rate and rhythm.  ABDOMEN: Soft, nontender, normal bowel sounds.  VITAL SIGNS: Most recent blood pressure 138/107, pulse 74, temperature 97.8.   LABORATORY RESULTS: Admission labs in the Emergency Room included a CBC that was normal. PT normal. No blood in the stool. Urinalysis normal. Drug screen positive for cocaine. Hemoglobin normal. Chemistry panel unremarkable. Salicylates slightly up. EKG:   Somewhat increased voltage criteria, unclear if it is abnormal. TSH normal at 1.4.   ASSESSMENT: A 59 year old man with alcohol dependence and depression, possibly related to the alcohol abuse requesting rehab treatment. Made a serious suicide attempt, but is cooperative with treatment, and is currently denying suicidal ideation. Not psychotic, not delirious.   TREATMENT PLAN: Admit to Psychiatry for substance-induced mood disorder stabilization as well as referral to longer term inpatient substance abuse treatment and completion of detox.   DIAGNOSIS, PRINCIPAL AND PRIMARY:  AXIS I: Substance-induced mood disorder.   SECONDARY DIAGNOSES: AXIS I: Rule out major depression. Alcohol dependence. Cocaine abuse.  AXIS II: Deferred.  AXIS III: Gastric reflux symptoms, past history of DVT.   AXIS IV: Severe from social losses and lack of support.  AXIS V: Functioning at time of evaluation 59.     ____________________________ Gonzella Lex, MD jtc:mr D: 03/28/2014 20:31:39 ET T: 03/28/2014 20:43:17 ET JOB#: [id number]  cc: Gonzella Lex, MD, <Dictator> Gonzella Lex MD ELECTRONICALLY SIGNED 04/11/2014 13:25

## 2015-02-16 NOTE — Discharge Summary (Signed)
PATIENT NAME:  Brent Byrd, Brent Byrd MR#:  [id number] DATE OF BIRTH:  1956/09/21  DATE OF ADMISSION:  03/26/2014 DATE OF DISCHARGE:  03/28/2014  ADMISSION DIAGNOSES: 1.  Suicidal attempt on Pradaxa.  2.  Alcohol abuse.  3.  Tobacco dependence.   DISCHARGE DIAGNOSES: 1.  Overdose of Pradaxa.  2.  Suicidal gesture.  3.  Alcohol abuse.   4.  History of deep venous thrombosis.  5.  History of tobacco dependence.  6.  History of chronic obstructive pulmonary disease.   LABORATORY DATA:  At discharge: White blood cells 8 , hemoglobin 14.2, hematocrit 43, platelets 147. Sodium 138, potassium 3.8, chloride 107, bicarbonate 25, BUN 11, creatinine 1.01, glucose 74, calcium 8.5, bilirubin 0.6, alkaline phosphatase 73, ALT 20, AST 31, total protein 7.5, albumin 3.6, magnesium 1.9.  INR 1.1.   HOSPITAL COURSE: This is a very pleasant 59 year old male who apparently took about 7 Pradaxa while drinking alcohol as a suicidal gesture. For further details, please refer to H and P.   1.  Overdose on Pradaxa. The patient's hemoglobin remained stable. No evidence of GI bleed. The patient may resume his Pradaxa which he was taking for DVT on 06/04.  2.  Suicidal attempt. Psychiatry has been consulted for further evaluation and recommendations. He will be transferred downstairs for further evaluation and treatment. 3.  Alcohol abuse. The patient was on CIWA protocol. The patient wants inpatient rehab.  4.  Tobacco dependence. The patient was counseled for 4 minutes regarding stopping smoking.  5. hx of DVT: Patient says he is not on pradaxa any longer. He took the ones he had leftove as a suidical gesture  DISCHARGE MEDICATIONS: 1.  Nicotine 21 mg per 24 hours patch daily.  2.  Citalopram 20 mg daily.  3.  Omeprazole 20 mg daily  DISCHARGE DIET: Low sodium.   DISCHARGE ACTIVITY: As tolerated.   DISCHARGE FOLLOW-UP:  The patient can follow up [facility] in one week.   The patient is medically stable for his  discharge.   TIME SPENT: 35 minutes.    ____________________________ Donell Beers. Benjie Karvonen, MD spm:dp D: 03/27/2014 12:36:36 ET T: 03/27/2014 13:35:56 ET JOB#: [id number]  cc: Aedon Deason P. Benjie Karvonen, MD, <Dictator> Donell Beers Kit Brubacher MD ELECTRONICALLY SIGNED 03/29/2014 12:36

## 2015-02-16 NOTE — Consult Note (Signed)
Brief Consult Note: Diagnosis: substance induced depression/alcohol dependence.   Patient was seen by consultant.   Recommend further assessment or treatment.   Discussed with Attending MD.   Comments: Patient tried to kill self while drunk. Wants to go to inpatient SA rehab. Will agree to admission to [facility]. Please complete medicine discharge and our intake person will see him and arrange transfer. Will try to contact Dr Benjie Karvonen.  Electronic Signatures: Gonzella Lex (MD)  (Signed 03-Jun-15 13:15)  Authored: Brief Consult Note   Last Updated: 03-Jun-15 13:15 by Gonzella Lex (MD)

## 2015-02-16 NOTE — Consult Note (Signed)
PATIENT NAME:  Brent Byrd, Brent Byrd MR#:  [id number] DATE OF BIRTH:  24-Dec-1955  DATE OF CONSULTATION:  05/01/2014  REFERRING PHYSICIAN:   CONSULTING PHYSICIAN:  Gonzella Lex, MD  IDENTIFYING INFORMATION AND REASON FOR CONSULTATION: A 59 year old man with a history of alcohol dependence came to the Emergency Room requesting detox.   CHIEF COMPLAINT: "The same thing."   HISTORY OF PRESENT ILLNESS: Information obtained from the patient and the chart. The patient was just discharged from the hospital on June 9th. At that time, he was supposed to go to [facility] for inpatient treatment. He claims that he went there for 1 day and then was told he had to leave because he was not a [employer]. He went back to staying with family members and has been drinking ever since. He was a little vague about how much. Says that over the last weekend he has been drinking large amounts of liquor daily. Also used cocaine over the weekend. Currently, he says his mood is feeling okay. He denies feeling depressed. He denies suicidal or homicidal ideation. He says that he was feeling a little bit sick, but has not thrown up today. He is requesting referral to some kind of longer term substance abuse program.   PAST PSYCHIATRIC HISTORY: Long history of substance abuse, mostly alcohol, also cocaine. Was just discharged to a rehab program a month ago, but it obviously did not stick. He has been to [place] in the past. He has a history of depression and has been on antidepressants, but no history of suicide attempts. It is not clear how much of it has been depression separate from his alcohol abuse.   PAST MEDICAL HISTORY: Gastric reflux symptoms.   SUBSTANCE ABUSE HISTORY: Says that he has been able to maintain sobriety in the past for up to a couple of years at a time. He is familiar with appropriate outpatient and inpatient treatment. Denies any history of seizures or delirium tremens with alcohol withdrawal.   SOCIAL  HISTORY: Not currently working. He is married but his wife is in a residential substance abuse program and cannot be there to be supportive for him. He does have children, but not who live with him. They are all adults. He has family in the area who are only intermittently supportive.   REVIEW OF SYSTEMS: Denies depression. Denies suicidal ideation. Denies hallucinations. No acute pulmonary or cardiac symptoms. Slightly sick to his stomach, but not throwing up. He is currently having a headache. The rest of the review of systems is negative.   MENTAL STATUS EXAMINATION: Reasonably well-groomed gentleman who looks his stated age, cooperative with the interview. Eye contact good. Psychomotor activity normal. Speech normal rate, tone and volume. Affect is slightly blunted. Mood stated as all right. Thoughts are lucid without loosening of associations or evidence of delusions. Denies auditory or visual hallucinations. Denies suicidal or homicidal ideation. Judgment and insight adequate. Short and long-term memory intact. Insight and judgment adequate and baseline intelligence normal.   LABORATORY RESULTS: CBC shows a low white count at 3.5, low platelet count 149. Chemistry panel has a slightly low calcium at 83, slightly elevated glucose at 100. Alcohol level was actually negative or zero. Cocaine was positive in the drug screen. He was complaining of chest pain when he first came to the ER. Troponins were drawn, but they are all negative.   ASSESSMENT: This is a 59 year old gentleman with history of alcohol dependence. Currently denies any suicidal ideation. Currently not showing acute  severe symptoms of withdrawal. No history of seizures or DTs in the past. No history of psychosis. He is requesting further inpatient treatment. When he was here last time he detoxed fairly easily. It has been recommended that we refer him to the alcohol and drug abuse treatment center in [place] and he agrees to this plan.    TREATMENT PLAN: Refer him to the alcohol and drug abuse treatment center. We will see if we can get a bed available soon. If not we may reconsider possible discharge.   DIAGNOSIS, PRINCIPAL AND PRIMARY:  AXIS I: Alcohol dependence.   SECONDARY DIAGNOSES: AXIS I: No further.  AXIS II: No further.  AXIS III: History of gastric reflux symptoms.  AXIS IV: Moderate to severe from homelessness.  AXIS V: Functioning at time of evaluation is 32.  ____________________________ Gonzella Lex, MD jtc:sb D: 05/01/2014 12:24:02 ET T: 05/01/2014 13:43:33 ET JOB#: [id number]  cc: Gonzella Lex, MD, <Dictator> Gonzella Lex MD ELECTRONICALLY SIGNED 05/01/2014 17:38

## 2015-02-17 NOTE — Consult Note (Signed)
PATIENT NAME:  Brent Byrd, BEDOYA MR#:  [id number] DATE OF BIRTH:  11-16-1955  DATE OF CONSULTATION:  02/18/2012  REFERRING PHYSICIAN:   CONSULTING PHYSICIAN:  Dionisio David, MD  INDICATION FOR CONSULTATION: Chest pain.   HISTORY OF PRESENT ILLNESS: This is a 59 year old African American male with a past medical history of hypertension, chronic obstructive pulmonary disease and hypercholesterolemia, who came into the hospital because of right leg weakness, right foot drop and he has numbness in his foot and knee. He also had mild slurred speech. Right now he has normal speech. Also, complained of chest pain, pressure-type, associated with shortness of breath and diaphoresis, which responded to nitroglycerin. He denies any chest pain right now.   PAST MEDICAL HISTORY: He denies history of diabetes or prior stroke.   SOCIAL HISTORY: He smokes 1 pack per day. Denies EtOH abuse.   FAMILY HISTORY: Positive for coronary artery disease.   ALLERGIES: None.   PHYSICAL EXAMINATION:  GENERAL: He is alert, oriented x3, in no acute distress.   VITAL SIGNS: Stable. Blood pressure 140/80, respirations 20, pulse 54, temperature 97.8.   NECK: No JVD.   LUNGS: Clear.   HEART: Regular rate and rhythm. Normal S1, S2. No audible murmur.   ABDOMEN: Soft, nontender, positive bowel sounds.   EXTREMITIES: No pedal edema.   NEUROLOGIC: He appears to be intact.   LABS/STUDIES: EKG shows normal sinus rhythm, 89 beats per minute, nonspecific ST-T changes. His first two sets of cardiac enzymes are negative. The rest of the labs also look unremarkable.   ASSESSMENT AND PLAN: Atypical chest pain. Myocardial infarction has been ruled out. He has questionable cerebrovascular accident with left lower leg weakness that should be evaluated as a priority. He is chest pain free right now. I am going to give him a follow-up in the office as an outpatient and will evaluate him as an outpatient at this time. I would  recommend evaluation of his left foot to see if this is related to cerebrovascular accident. I would advise continuing aspirin and statin. Thank you very much for the referral.    ____________________________ Dionisio David, MD sak:ap D: 02/18/2012 08:25:29 ET T: 02/18/2012 09:09:54 ET JOB#: [id number]  cc: Dionisio David, MD, <Dictator> Dionisio David MD ELECTRONICALLY SIGNED 03/01/2012 16:44

## 2015-02-17 NOTE — Consult Note (Signed)
Referring Physician:  Ellin Saba :   Primary Care Physician:  Ellin Saba : [place], [address], [place], [place] [address]  Reason for Consult:  Admit Date: 17-Feb-2012   Chief Complaint: R leg weakness   Reason for Consult: CVA   History of Present Illness:  History of Present Illness:   58 yo RHD AAM presents with acute onset of R leg weakness on Monday with mainly problems.  With the first day, family noted that he had slurred speech which lasted for about a day.  Pt denies pain in the leg now.  Pt has had similar symptoms before when he was diagnosed with sciatica and then had lumbar surgery per patient.  This was several years ago and got better.  Pt denies any vision problems, difficulty speaking or R arm weakness.  ROS:   General denies complaints    HEENT no complaints    Lungs SOB    Cardiac chest pain    GI no complaints    GU no complaints    Musculoskeletal no complaints    Extremities no complaints    Skin no complaints    Neuro numbness/tingling  weakness in R leg    Endocrine no complaints    Psych no complaints   Past Medical/Surgical Hx:  MI - Myocardial Infarct:   DVT:   GERD - Esophageal Reflux:   COPD:   Emphysema:   HTN:   left wrist repair:   Back Surgery:   Past Medical/ Surgical Hx:   Past Medical History as above    Past Surgical History slipped disc surgery in back but does not know the level   Home Medications: Medication Instructions Last Modified Date/Time  omeprazole 20 mg oral delayed release capsule 1 cap(s) orally once a day AM 24-Apr-13 20:27  Advair Diskus 250 mcg-50 mcg inhalation powder 1 puff(s) inhaled 2 times a day  24-Apr-13 20:27  Norvasc 20 milligram(s) orally once a day 24-Apr-13 20:27  Diovan 40 mg oral tablet 1 tab(s) orally once a day 24-Apr-13 20:27   Allergies:  IVP Dye: Chest Tightness, Hives, Itching  Social/Family History:  Employment Status:  unemployed   Lives With: spouse   Living Arrangements: house   Social History: still smoking, drinks on the weekends, remote cocaine   Family History: n/c   Vital Signs: **Vital Signs.:   25-Apr-13 11:09   Vital Signs Type Routine   Temperature Temperature (F) 97.9   Celsius 36.6   Temperature Source oral   Pulse Pulse 64   Pulse source per Dinamap   Respirations Respirations 20   Systolic BP Systolic BP 710   Diastolic BP (mmHg) Diastolic BP (mmHg) 86   Mean BP 102   BP Source Dinamap   Pulse Ox % Pulse Ox % 97   Pulse Ox Activity Level  At rest   Oxygen Delivery Room Air/ 21 %   Physical Exam:  General: appropiate weight, well nourished, no acute distress   HEENT: normocephalic, sclera nonicteric, oropharynx clear   Neck: supple, no JVD, no bruits   Chest: CTA B, no wheezes , good movement   Cardiac: RRR, no murmur, 2+ pulses   Extremities: no C/C/E, FROM   Neurologic Exam:  Mental Status: A+Ox4, nl speech and language   Cranial Nerves: PERRLA, EOMI, nl VF, face symmmetric, tongue midline, shoulder shrug equal   Motor Exam: 5/5 B except 5-/5 R hamstrings, 2/5 R dorsiflexion and inversion, nl tone  Deep Tendon Reflexes: 1+ B UE,  1+ R 2+ L patellar, absent achilles B, down plantars   Sensory Exam: decreased pinprick and temp on R leg below the knee and in the web between 1st and 2nd toes   Coordination: FTN and HTS WNL   Gait: deferred   Lab Results: Routine Chem:  24-Apr-13 19:55    Glucose, Serum   100   BUN 12   Creatinine (comp) 1.06   Sodium, Serum 141   Potassium, Serum 3.9   Chloride, Serum 107   CO2, Serum 23   Calcium (Total), Serum 9.0   Anion Gap 11   Osmolality (calc) 281   eGFR (African American) >60   eGFR (Non-African American) >60  25-Apr-13 04:13    Cholesterol, Serum 164   Triglycerides, Serum 78   HDL (INHOUSE)   79   VLDL Cholesterol Calculated 16   LDL Cholesterol Calculated 69  Cardiac:  25-Apr-13 04:13    Troponin I < 0.02   Routine Hem:  24-Apr-13 19:55    WBC (CBC) 4.4   RBC (CBC)   4.39   Hemoglobin (CBC) 14.0   Hematocrit (CBC) 42.1   Platelet Count (CBC) 161   MCV 96   MCH 31.9   MCHC 33.3   RDW   15.7   Radiology Results: [place]:    25-Apr-13 09:41, US Carotid Doppler Bilateral   US Carotid Doppler Bilateral    REASON FOR EXAM:    CVA- right sided weakness  COMMENTS:   LMP: (Male)    PROCEDURE: [place]  - US CAROTID DOPPLER BILATERAL  - Feb 18 2012  9:41AM     RESULT: Bilateral carotid color-flow duplex Doppler reveals mild plaque   left carotid bifurcation. No hemodynamic significant stenosis. Peak   systolic flow velocity ratio on the right is 1.3 , left 1.2. Vertebrals   are patent with antegrade flow.    IMPRESSION:  No hemodynamically significant stenosis.          Verified By: Osa Craver, M.D.,MD  CT:    24-Apr-13 22:40, CT Head Without Contrast   CT Head Without Contrast    REASON FOR EXAM:    R leg numbness and weakness since 4/22; difficulty   walking; had slurred speech o  COMMENTS:       PROCEDURE: CT  - CT HEAD WITHOUT CONTRAST  - Feb 17 2012 10:40PM     RESULT: Comparison:  None    Technique: Multiple axial imagesfrom the foramen magnum to the vertex   were obtained without IV contrast.    Findings:      There is no evidence of mass effect, midline shift, or extra-axial fluid   collections.  There is no evidence of a space-occupying lesion or     intracranialhemorrhage. There is no evidence of a cortical-based area of   acute infarction.      The ventricles and sulci are appropriate for the patient's age. The basal   cisterns are patent.    Visualized portions of the orbits are unremarkable. The visualized   portions of the paranasal sinuses and mastoid air cells are unremarkable.     The osseous structures are unremarkable.    IMPRESSION:      No acute intracranial process.    Dictation Site: 1          Verified By: Jennette Banker, M.D., MD    Impression/Recommendations:  Recommendations:   CT of head personally reviewed by me and  shows no white matter changes or hemorrhages discussed with referring physician   R leg weakness-  it is difficult to state where this is coming from because we have two comfounding things in history and actual examination.  History is suggestive of stroke with him waking up acutely, a few risk factors and the speech problems.  Exam is more consistent with sciatic neuropathy in which he has had previous that affected the same side.  It could be a small stroke that affect the R leg and worsened previous neuropathy symptoms vs. worse compression. HTN-  somewhat controlled Tobacco abuse  agree with MRI of brain to look for stroke;  would also do MRI of L-spine advise pt to stop smoking goal BP should be 130/80 start low dose ASA 47m daily would not add statin agree with PT evaluation and pt may need AFO for ambulating will follow results of these  Electronic Signatures: SJamison Neighbor(MD)  (Signed 25-Apr-13 12:36)  Authored: REFERRING PHYSICIAN, Primary Care Physician, Consult, History of Present Illness, Review of Systems, PAST MEDICAL/SURGICAL HISTORY, HOME MEDICATIONS, ALLERGIES, Social/Family History, NURSING VITAL SIGNS, Physical Exam-, LAB RESULTS, RADIOLOGY RESULTS, Recommendations   Last Updated: 25-Apr-13 12:36 by SJamison Neighbor(MD)

## 2015-02-17 NOTE — H&P (Signed)
PATIENT NAME:  Brent Byrd, Brent Byrd MR#:  [id number] DATE OF BIRTH:  1956-05-16  DATE OF ADMISSION:  02/17/2012  PRIMARY CARE PHYSICIAN: Brent Pugh, MD   CHIEF COMPLAINT: Right leg weakness.   HISTORY OF PRESENT ILLNESS: Brent Byrd is a 59 year old African American male. He came to the Emergency Department for evaluation of two problems. One of them is weakness in his right leg. It started Monday and progressively got worse. He cannot dorsiflex his right foot. He also had numbness extending from the knee down to the foot. This also affected his walking. It is worth mentioning that his wife told him that he had slurred speech as if he is drunk. Additionally, also on Monday the patient developed midsternal chest pain described as squeezing-like pain with severity 10 on a scale of 10 associated with some shortness of breath earlier but not today. He had one episode of vomiting yesterday morning but none since. He received nitroglycerin in the Emergency Department and his chest pain eased off down to a scale of 1 to 2/10. The patient was admitted to the hospital for further evaluation of his complaints.   REVIEW OF SYSTEMS: CONSTITUTIONAL: Denies any fever. No chills. No night sweats. No fatigue. EYES: No blurring of vision. No double vision. ENT: No hearing impairment. No sore throat. No dysphagia. CARDIOVASCULAR: Reports chest pain and shortness of breath as above. No edema. No syncope. RESPIRATORY: No cough. No sputum production. He had mild shortness of breath earlier. No wheezing. GASTROINTESTINAL: No abdominal pain. No vomiting. No diarrhea. He had only one episode of vomiting yesterday morning but none since. GENITOURINARY: No dysuria. No frequency of urination. MUSCULOSKELETAL: No joint pain or swelling. No muscular pain or swelling other than occasional right thigh pain. INTEGUMENTARY: No skin rash. No ulcers. NEUROLOGY: He has focal weakness in his right flank. No seizure activity. No headache. PSYCHIATRY:  No anxiety. No depression. ENDOCRINE: No polyuria or polydipsia. No heat or cold intolerance.   PAST MEDICAL HISTORY:  1. History of systemic hypertension. 2. Chronic obstructive pulmonary disease. 3. Ongoing tobacco abuse. 4. Gastroesophageal reflux disease.  5. In the past he had cocaine abuse; the last time was more than a year ago.   PAST SURGICAL HISTORY:  1. History of back surgery for herniated disk disease. 2. History of surgery for cyst in his wrist.   FAMILY HISTORY: His parents are deceased. He reports that his mother died from respiratory failure and she suffered from asthma. His father died from lung cancer.   SOCIAL HISTORY: The patient is married, living with his wife. He works as a Dealer.   SOCIAL HABITS: Chronic smoker; one pack lasts him about two days. He started smoking when he was a teenager. He drinks alcohol over weekends, usually beer, 12 packs over the weekends only. The last time he had used cocaine was more than a year ago.   ADMISSION MEDICATIONS:  1. Omeprazole 20 mg a day. 2. Amlodipine 10 mg a day. 3. Diovan 80 mg a day. 4. Advair 250 twice a day. 5. Combivent 2 puffs q.6 hours p.r.n.   ALLERGIES: IVP dye.   PHYSICAL EXAMINATION:   VITAL SIGNS: Blood pressure 119/85, respiratory rate 18, pulse 74, temperature 99.1, oxygen saturation 97%.   GENERAL APPEARANCE: Middle-aged male laying in bed in no acute distress.   HEAD: Unremarkable. No pallor. No icterus. No cyanosis.   EARS, NOSE, AND THROAT: Hearing was normal. Nasal mucosa, lips, tongue were normal.   EYES: Normal eyelids and conjunctivae.  Pupils about 4 mm, equal and reactive to light.   NECK: Supple. Trachea at midline. No thyromegaly. No cervical lymphadenopathy. No masses.   HEART: Normal S1, S2. No S3, S4. No murmur. No gallop. No carotid bruits.   RESPIRATORY: Normal breathing pattern without use of accessory muscles. No rales. No wheezing.   ABDOMEN: Soft without tenderness. No  hepatosplenomegaly. No masses. No hernias.   SKIN: No ulcers. No subcutaneous nodules.   MUSCULOSKELETAL: No joint swelling. No clubbing.   NEUROLOGIC: Cranial nerves II through XII are intact. However, there is subtle right facial droop. He has weakness in his right leg more pronounced with dorsiflexion of the right foot. No other focal weakness and he has no weakness in his arms.   PSYCHIATRIC: The patient is alert and oriented x3. Mood and affect were normal.   LABORATORY, DIAGNOSTIC, AND RADIOLOGICAL DATA: EKG showed normal sinus rhythm at rate of 89 per minute. Nonspecific ST-T wave abnormalities. Otherwise, unremarkable EKG.  CAT scan of the head showed no acute intracranial abnormalities.   Serum glucose 100, BUN 12, creatinine 1.06, sodium 141, potassium 3.9. Troponin 0.02. CBC showed white count of 14,000, hemoglobin 14, hematocrit 42, platelet count 161.   ASSESSMENT:  1. Acute stroke. Apparently this happened probably three days ago. This is affecting the distal right leg. The differential diagnosis also will include peripheral neuropathy or herniated disk disease with mononeuropathy, however, the presence of slurred speech earlier and the subtle right facial weakness is more indicative of stroke rather than peripheral neuropathy.  2. Chest pain x3 days, responded to sublingual nitroglycerin. Not sure at this point whether this is cardiac or GI in origin.  3. Systemic hypertension.  4. Chronic obstructive pulmonary disease.  5. Tobacco abuse.   PLAN:  1. The patient is admitted. 2. I ordered MRI of the head for tomorrow morning.  3. Frequent neurologic examinations and follow-up.  4. Neurology consultation.  5. Start aspirin 325 mg a day.  6. Obtain carotid ultrasound.  7. Also, obtain echocardiogram.  8. For his chest pain, aspirin was started. I added also beta-blocker I will continue Diovan and Norvasc.  9. Cardiology consultation.  10. Check lipid profile in the  morning.  11. Add Lipitor 40 mg a day.  12. The patient needs to quit smoking. I will place him on nicotine patch 7 mg a day.   TIME SPENT EVALUATING THIS PATIENT: More than 55 minutes.   ____________________________ Clovis Pu. Lenore Manner, MD amd:drc D: 02/18/2012 01:03:56 ET T: 02/18/2012 09:25:05 ET JOB#: [id number]  cc: Clovis Pu. Lenore Manner, MD, <Dictator> Mikeal Hawthorne. Brynda Greathouse, MD Clovis Pu [place] MD ELECTRONICALLY SIGNED 02/25/2012 22:10

## 2015-02-17 NOTE — Discharge Summary (Signed)
PATIENT NAME:  Brent Byrd, MITTAG MR#:  [id number] DATE OF BIRTH:  10/23/1956  DATE OF ADMISSION:  02/18/2012 DATE OF DISCHARGE:  02/19/2012  REASON FOR ADMISSION: Patient was admitted with right leg weakness.   HISTORY OF PRESENT ILLNESS: This is a 59 year old male came to the Emergency Room for two problems; weakness of the right leg, started Monday, progressively getting worse, cannot dorsiflex the right foot; also had numbness from the knee down, difficulty walking. The wife also mentions slurred speech and also had chest pain. Patient was admitted with a suspected acute stroke and chest pain. An MRI of the brain was ordered. Neurological consultation and cardiac consultation was ordered. Patient was seen in consultation by Dr. Neoma Laming, cardiology, who saw the patient for the atypical chest pain. Myocardial infarctions was ruled out at that time and follow up in office as outpatient. Dr. Valora Corporal from neurology saw the patient who also ordered an MRI of the lumbar spine.   LABORATORY, DIAGNOSTIC AND RADIOLOGICAL DATA: EKG showed normal sinus rhythm, nonspecific ST abnormality. Cardiac enzymes were negative. Glucose 100, BUN 12, creatinine 1.06, sodium 141, potassium 3.9, chloride 107, CO2 23, calcium 8.9, white blood cell count 4.4, hemoglobin and hematocrit 14.0 and 42.1, platelet count 161. Chest x-ray: No acute abnormality. CT scan of the head: No acute intracranial process. LDL 69, HDL 79, triglycerides 78. Echocardiogram showed normal chamber size and normal systolic function, mild diastolic dysfunction, mild MR. Ultrasound of the carotids showed no hemodynamically significant stenosis. MRI of the brain showed no acute infarct. MRI of the lumbar spine showed multilevel degenerative disk and facet disease in the lower lumbar spine including a moderate right paracentral disk herniation L4-L5 which abuts the descending right L5 nerve root and then also L4-L5 represents extensive bone marrow  endplate reactive changes. Clinical correlation recommended.   FINAL DIAGNOSES:  1. Back disk herniation with right foot drop and right leg numbness. The patient was given a trial of Solu-Medrol. Case was discussed with neurology, Dr. Tamala Julian, who recommended conservative management with physical therapy. This was set up as outpatient. The patient was also given a right foot brace to prevent his foot from dropping down while he is walking and that was given prior to discharge. Aspirin was given for anti-inflammatory also once the prednisone was finished. If conservative management does not help I did give him the number for [facility] in [place] to call.  2. For his chest pain, cardiac enzymes were negative. He was seen in consultation by Dr. Humphrey Rolls, cardiology, who set him up with a follow-up appointment.  3. Hypertension. He is on Diovan 80 mg p.o. daily and Norvasc daily. Blood pressure upon discharge 122/80.  4. For his chronic obstructive pulmonary he is on an Advair Diskus. He is a smoker. Smoking cessation counseling done, three minutes by me. Nicotine patch applied.  5. For his gastroesophageal reflux disease, he is on omeprazole.   FOLLOW UP:  1. Follow up with home physical therapy.  2. Dr. Humphrey Rolls, cardiology. 3. [facility].  4. Follow up 1 to 2 weeks with Dr. Brynda Greathouse.   DIET: Low sodium diet.   ACTIVITY: Activity as tolerated.   MEDICATIONS ON DISCHARGE:  1. Omeprazole 20 mg daily.  2. Advair Diskus 250/50, 1 inhalation twice a day.  3. Norvasc 10 mg daily.  4. Prednisone taper as written on prescription.  5. Aspirin 81 mg p.o. daily.  6. Diovan 80 mg p.o. daily.  7. Nicotine patch 14 mg  chest wall daily.   TIME SPENT ON DISCHARGE: 35 minutes.   ____________________________ Tana Conch. Leslye Peer, MD rjw:cms D: 02/21/2012 13:35:49 ET T: 02/22/2012 12:00:30 ET JOB#: [id number] cc: Tana Conch. Leslye Peer, MD, <Dictator> Mikeal Hawthorne. Brynda Greathouse,  MD Marisue Brooklyn MD ELECTRONICALLY SIGNED 02/27/2012 17:21

## 2015-03-28 ENCOUNTER — Emergency Department (HOSPITAL_COMMUNITY)
Admission: EM | Admit: 2015-03-28 | Discharge: 2015-03-29 | Disposition: A | Discharge status: Home / Self Care | Payer: Medicaid Other | Attending: Emergency Medicine | Admitting: Emergency Medicine

## 2015-03-28 ENCOUNTER — Emergency Department (HOSPITAL_COMMUNITY): Payer: Medicaid Other

## 2015-03-28 ENCOUNTER — Encounter (HOSPITAL_COMMUNITY): Payer: Self-pay | Admitting: Emergency Medicine

## 2015-03-28 DIAGNOSIS — R079 Chest pain, unspecified: Secondary | ICD-10-CM | POA: Diagnosis not present

## 2015-03-28 DIAGNOSIS — Z8719 Personal history of other diseases of the digestive system: Secondary | ICD-10-CM | POA: Diagnosis not present

## 2015-03-28 DIAGNOSIS — I252 Old myocardial infarction: Secondary | ICD-10-CM | POA: Insufficient documentation

## 2015-03-28 DIAGNOSIS — M109 Gout, unspecified: Secondary | ICD-10-CM | POA: Diagnosis not present

## 2015-03-28 DIAGNOSIS — I10 Essential (primary) hypertension: Secondary | ICD-10-CM | POA: Insufficient documentation

## 2015-03-28 DIAGNOSIS — Z720 Tobacco use: Secondary | ICD-10-CM | POA: Insufficient documentation

## 2015-03-28 DIAGNOSIS — R197 Diarrhea, unspecified: Secondary | ICD-10-CM | POA: Insufficient documentation

## 2015-03-28 DIAGNOSIS — J441 Chronic obstructive pulmonary disease with (acute) exacerbation: Secondary | ICD-10-CM | POA: Insufficient documentation

## 2015-03-28 DIAGNOSIS — F10120 Alcohol abuse with intoxication, uncomplicated: Secondary | ICD-10-CM | POA: Insufficient documentation

## 2015-03-28 HISTORY — DX: Old myocardial infarction: I25.2

## 2015-03-28 LAB — CBC
HCT: 37.2 % — ABNORMAL LOW (ref 39.0–52.0)
HEMOGLOBIN: 12.6 g/dL — AB (ref 13.0–17.0)
MCH: 31.1 pg (ref 26.0–34.0)
MCHC: 33.9 g/dL (ref 30.0–36.0)
MCV: 91.9 fL (ref 78.0–100.0)
Platelets: 210 10*3/uL (ref 150–400)
RBC: 4.05 MIL/uL — ABNORMAL LOW (ref 4.22–5.81)
RDW: 14.5 % (ref 11.5–15.5)
WBC: 4.6 10*3/uL (ref 4.0–10.5)

## 2015-03-28 LAB — BASIC METABOLIC PANEL
Anion gap: 9 (ref 5–15)
BUN: 6 mg/dL (ref 6–20)
CHLORIDE: 111 mmol/L (ref 101–111)
CO2: 20 mmol/L — ABNORMAL LOW (ref 22–32)
Calcium: 8.1 mg/dL — ABNORMAL LOW (ref 8.9–10.3)
Creatinine, Ser: 0.87 mg/dL (ref 0.61–1.24)
GLUCOSE: 92 mg/dL (ref 65–99)
Potassium: 3.8 mmol/L (ref 3.5–5.1)
SODIUM: 140 mmol/L (ref 135–145)

## 2015-03-28 LAB — BRAIN NATRIURETIC PEPTIDE: B Natriuretic Peptide: 38.1 pg/mL (ref 0.0–100.0)

## 2015-03-28 NOTE — ED Provider Notes (Signed)
CSN: [id number]     Arrival date & time 03/28/15  2035 History   First MD Initiated Contact with Patient 03/28/15 2036     Chief Complaint  Patient presents with  . Chest Pain     (Consider location/radiation/quality/duration/timing/severity/associated sxs/prior Treatment) Patient is a 59 y.o. male presenting with chest pain. The history is provided by the patient and the EMS personnel.  Chest Pain Pain location:  Substernal area Pain quality: pressure   Pain radiates to:  Does not radiate Pain radiates to the back: no   Pain severity:  Moderate Onset quality:  Gradual Duration:  17 hours Timing:  Constant Progression:  Unchanged Chronicity:  New Context: at rest   Context: not breathing   Relieved by:  Nothing Worsened by:  Nothing tried Ineffective treatments:  Aspirin and nitroglycerin Associated symptoms: shortness of breath   Associated symptoms: no abdominal pain, no cough, no fever, no syncope and not vomiting   Risk factors: no coronary artery disease   Risk factors comment:  EtOH   Past Medical History  Diagnosis Date  . Substance abuse     Alcohol dependence Hx  . Chest pain   . Arthritis     Gout-Left knee  . COPD (chronic obstructive pulmonary disease)   . Hypertension   . Gastroesophageal reflux disease 2014  . MI, old    Past Surgical History  Procedure Laterality Date  . Spine surgery      S/P Herniated disc   Family History  Problem Relation Age of Onset  . Asthma Mother   . Cancer Father     Lung  . Cancer Sister   . Thrombosis Sister    History  Substance Use Topics  . Smoking status: Current Every Day Smoker -- 0.50 packs/day    Types: Cigarettes  . Smokeless tobacco: Never Used  . Alcohol Use: Yes     Comment: Drinking heavy for the last 2 weeks ( Oct. 2014 )    Review of Systems  Constitutional: Negative for fever.  Respiratory: Positive for shortness of breath. Negative for cough.   Cardiovascular: Positive for chest pain.  Negative for syncope.  Gastrointestinal: Positive for diarrhea. Negative for vomiting and abdominal pain.  All other systems reviewed and are negative.     Allergies  Iodine and Omnipaque  Home Medications   Prior to Admission medications   Medication Sig Start Date End Date Taking? Authorizing Provider  aspirin 81 MG chewable tablet Chew 324 mg by mouth once.   Yes Historical Provider, MD  ibuprofen (ADVIL,MOTRIN) 200 MG tablet Take 400 mg by mouth every 6 (six) hours as needed for moderate pain.   Yes Historical Provider, MD  nitroGLYCERIN (NITROSTAT) 0.4 MG SL tablet Place 0.4 mg under the tongue every 5 (five) minutes as needed for chest pain.   Yes Historical Provider, MD  acetaminophen (TYLENOL) 500 MG tablet Take 2 tablets (1,000 mg total) by mouth every 6 (six) hours as needed. 09/25/14   Serita Grit, MD   BP 122/89 mmHg  Pulse 76  Temp(Src) 98.2 F (36.8 C) (Oral)  Resp 14  SpO2 99% Physical Exam  Constitutional: He is oriented to person, place, and time. He appears well-developed and well-nourished. No distress.  HENT:  Head: Normocephalic and atraumatic.  Eyes: Conjunctivae are normal.  Neck: Neck supple. No tracheal deviation present.  Cardiovascular: Normal rate, regular rhythm and normal heart sounds.   Pulmonary/Chest: Effort normal. No respiratory distress. He has no wheezes. He has  no rales. He exhibits no tenderness.  Abdominal: Soft. He exhibits no distension. There is no tenderness.  Neurological: He is alert and oriented to person, place, and time.  Skin: Skin is warm and dry.  Psychiatric: He has a normal mood and affect. Thought content normal. His speech is tangential and slurred.    ED Course  Procedures (including critical care time) Labs Review Labs Reviewed  BASIC METABOLIC PANEL - Abnormal; Notable for the following:    CO2 20 (*)    Calcium 8.1 (*)    All other components within normal limits  CBC - Abnormal; Notable for the following:     RBC 4.05 (*)    Hemoglobin 12.6 (*)    HCT 37.2 (*)    All other components within normal limits  BRAIN NATRIURETIC PEPTIDE  ETHANOL  TROPONIN I  I-STAT TROPOININ, ED    Imaging Review Dg Chest Port 1 View  03/28/2015   CLINICAL DATA:  Acute onset of substernal chest pain, shortness of breath and diarrhea. Initial encounter.  EXAM: PORTABLE CHEST - 1 VIEW  COMPARISON:  Chest radiograph performed 04/30/2014  FINDINGS: The lungs are well-aerated. Mild bibasilar opacities likely reflect atelectasis, though mild pneumonia could have a similar appearance depending on the patient's symptoms. There is no evidence of pleural effusion or pneumothorax.  The cardiomediastinal silhouette is within normal limits. There is mildly increased prominence of the right hilum, likely reflecting the vasculature. No acute osseous abnormalities are seen.  IMPRESSION: 1. Mild bibasilar airspace opacities likely reflect atelectasis, though mild pneumonia could have a similar appearance. 2. Mildly increased prominence of the right hilum likely reflects the vasculature, given suggestion of mild underlying vascular congestion.   Electronically Signed   By: Garald Balding M.D.   On: 03/28/2015 21:34     EKG Interpretation   Date/Time:  Thursday March 28 2015 20:42:13 EDT Ventricular Rate:  82 PR Interval:  192 QRS Duration: 94 QT Interval:  394 QTC Calculation: 460 R Axis:   -9 Text Interpretation:  Sinus rhythm LVH by voltage nonspecific t wave  changes resolved since last tracing Confirmed by KNAPP  MD-J, JON ([id number])  on 03/28/2015 8:47:15 PM      MDM   Final diagnoses:  Alcohol intake above recommended sensible limits, uncomplicated  Chest pain at rest    59 year old male presents with ongoing chest pain for the last 17 hours. He is clinically intoxicated and complaining of active substernal chest pressure. He was given a full dose of aspirin and a single sublingual nitroglycerin by EMS prior to arrival which  causes blood pressure to drop slightly. His blood pressure improved after fluids. He does have a history of prior EtOH abuse and crack cocaine.  History chest pain is typical and mildly concerning for ACS but given the duration of his symptoms a single troponin should be sufficient to rule out as he has no ischemic findings on his EKG. Do not suspect pulmonary embolism and he has no risk factors.  D/c if trop negative and ambulatory.  Leo Grosser, MD 03/29/15 [id number]  Dorie Rank, MD 03/29/15 [phone]

## 2015-03-28 NOTE — ED Notes (Signed)
Pt arrives with CP ongoing for 17 hours, substernal, unspecified. SOB, diarrhea, no nausea. ECG NSR. Pt admits to one 40 "to kill the pain", Hx of MI. 324 MG ASA on board, 1 SL not effective for pain.

## 2015-03-28 NOTE — ED Notes (Signed)
Port X-ray at bedside.  

## 2015-03-29 LAB — TROPONIN I: Troponin I: <0.03 ng/mL (ref <0.031)

## 2015-03-29 LAB — ETHANOL: Alcohol, Ethyl (B): 253 mg/dL — ABNORMAL HIGH (ref <5)

## 2015-03-29 NOTE — ED Notes (Signed)
Pt has sober ride home.

## 2015-03-29 NOTE — Discharge Instructions (Signed)
°Alcohol Intoxication °Alcohol intoxication occurs when the amount of alcohol that a person has consumed impairs his or her ability to mentally and physically function. Alcohol directly impairs the normal chemical activity of the brain. Drinking large amounts of alcohol can lead to changes in mental function and behavior, and it can cause many physical effects that can be harmful.  °Alcohol intoxication can range in severity from mild to very severe. Various factors can affect the level of intoxication that occurs, such as the person's age, gender, weight, frequency of alcohol consumption, and the presence of other medical conditions (such as diabetes, seizures, or heart conditions). Dangerous levels of alcohol intoxication may occur when people drink large amounts of alcohol in a short period (binge drinking). Alcohol can also be especially dangerous when combined with certain prescription medicines or "recreational" drugs. °SIGNS AND SYMPTOMS °Some common signs and symptoms of mild alcohol intoxication include: °· Loss of coordination. °· Changes in mood and behavior. °· Impaired judgment. °· Slurred speech. °As alcohol intoxication progresses to more severe levels, other signs and symptoms will appear. These may include: °· Vomiting. °· Confusion and impaired memory. °· Slowed breathing. °· Seizures. °· Loss of consciousness. °DIAGNOSIS  °Your health care provider will take a medical history and perform a physical exam. You will be asked about the amount and type of alcohol you have consumed. Blood tests will be done to measure the concentration of alcohol in your blood. In many places, your blood alcohol level must be lower than 80 mg/dL (0.08%) to legally drive. However, many dangerous effects of alcohol can occur at much lower levels.  °TREATMENT  °People with alcohol intoxication often do not require treatment. Most of the effects of alcohol intoxication are temporary, and they go away as the alcohol  naturally leaves the body. Your health care provider will monitor your condition until you are stable enough to go home. Fluids are sometimes given through an IV access tube to help prevent dehydration.  °HOME CARE INSTRUCTIONS °· Do not drive after drinking alcohol. °· Stay hydrated. Drink enough water and fluids to keep your urine clear or pale yellow. Avoid caffeine.   °· Only take over-the-counter or prescription medicines as directed by your health care provider.   °SEEK MEDICAL CARE IF:  °· You have persistent vomiting.   °· You do not feel better after a few days. °· You have frequent alcohol intoxication. Your health care provider can help determine if you should see a substance use treatment counselor. °SEEK IMMEDIATE MEDICAL CARE IF:  °· You become shaky or tremble when you try to stop drinking.   °· You shake uncontrollably (seizure).   °· You throw up (vomit) blood. This may be bright red or may look like black coffee grounds.   °· You have blood in your stool. This may be bright red or may appear as a black, tarry, bad smelling stool.   °· You become lightheaded or faint.   °MAKE SURE YOU:  °· Understand these instructions. °· Will watch your condition. °· Will get help right away if you are not doing well or get worse. °Document Released: 07/22/2005 Document Revised: 06/14/2013 Document Reviewed: 03/17/2013 °ExitCare® Patient Information ©2015 [employer]. This information is not intended to replace advice given to you by your health care provider. Make sure you discuss any questions you have with your health care provider. °Chest Pain (Nonspecific) °It is often hard to give a specific diagnosis for the cause of chest pain. There is always a chance that your pain could   be related to something serious, such as a heart attack or a blood clot in the lungs. You need to follow up with your health care provider for further evaluation. °CAUSES  °· Heartburn. °· Pneumonia or bronchitis. °· Anxiety or  stress. °· Inflammation around your heart (pericarditis) or lung (pleuritis or pleurisy). °· A blood clot in the lung. °· A collapsed lung (pneumothorax). It can develop suddenly on its own (spontaneous pneumothorax) or from trauma to the chest. °· Shingles infection (herpes zoster virus). °The chest wall is composed of bones, muscles, and cartilage. Any of these can be the source of the pain. °· The bones can be bruised by injury. °· The muscles or cartilage can be strained by coughing or overwork. °· The cartilage can be affected by inflammation and become sore (costochondritis). °DIAGNOSIS  °Lab tests or other studies may be needed to find the cause of your pain. Your health care provider may have you take a test called an ambulatory electrocardiogram (ECG). An ECG records your heartbeat patterns over a 24-hour period. You may also have other tests, such as: °· Transthoracic echocardiogram (TTE). During echocardiography, sound waves are used to evaluate how blood flows through your heart. °· Transesophageal echocardiogram (TEE). °· Cardiac monitoring. This allows your health care provider to monitor your heart rate and rhythm in real time. °· Holter monitor. This is a portable device that records your heartbeat and can help diagnose heart arrhythmias. It allows your health care provider to track your heart activity for several days, if needed. °· Stress tests by exercise or by giving medicine that makes the heart beat faster. °TREATMENT  °· Treatment depends on what may be causing your chest pain. Treatment may include: °· Acid blockers for heartburn. °· Anti-inflammatory medicine. °· Pain medicine for inflammatory conditions. °· Antibiotics if an infection is present. °· You may be advised to change lifestyle habits. This includes stopping smoking and avoiding alcohol, caffeine, and chocolate. °· You may be advised to keep your head raised (elevated) when sleeping. This reduces the chance of acid going backward  from your stomach into your esophagus. °Most of the time, nonspecific chest pain will improve within 2-3 days with rest and mild pain medicine.  °HOME CARE INSTRUCTIONS  °· If antibiotics were prescribed, take them as directed. Finish them even if you start to feel better. °· For the next few days, avoid physical activities that bring on chest pain. Continue physical activities as directed. °· Do not use any tobacco products, including cigarettes, chewing tobacco, or electronic cigarettes. °· Avoid drinking alcohol. °· Only take medicine as directed by your health care provider. °· Follow your health care provider's suggestions for further testing if your chest pain does not go away. °· Keep any follow-up appointments you made. If you do not go to an appointment, you could develop lasting (chronic) problems with pain. If there is any problem keeping an appointment, call to reschedule. °SEEK MEDICAL CARE IF:  °· Your chest pain does not go away, even after treatment. °· You have a rash with blisters on your chest. °· You have a fever. °SEEK IMMEDIATE MEDICAL CARE IF:  °· You have increased chest pain or pain that spreads to your arm, neck, jaw, back, or abdomen. °· You have shortness of breath. °· You have an increasing cough, or you cough up blood. °· You have severe back or abdominal pain. °· You feel nauseous or vomit. °· You have severe weakness. °· You faint. °· You   have chills. °This is an emergency. Do not wait to see if the pain will go away. Get medical help at once. Call your local emergency services (911 in [place] Do not drive yourself to the hospital. °MAKE SURE YOU:  °· Understand these instructions. °· Will watch your condition. °· Will get help right away if you are not doing well or get worse. °Document Released: 07/22/2005 Document Revised: 10/17/2013 Document Reviewed: 05/17/2008 °ExitCare® Patient Information ©2015 [employer]. This information is not intended to replace advice given to you by  your health care provider. Make sure you discuss any questions you have with your health care provider. ° °

## 2015-05-09 ENCOUNTER — Institutional Professional Consult (permissible substitution): Payer: Medicaid Other | Admitting: Internal Medicine

## 2015-05-16 ENCOUNTER — Telehealth: Payer: Self-pay | Admitting: Internal Medicine

## 2015-05-16 NOTE — Telephone Encounter (Signed)
Received records from [facility]--Dr Jeanie Cooks, for appointment on 06/20/15 with Dr Debara Pickett.  Records given to D Chavis (medical records) for Dr [facility] schedule on 06/20/15.  lp

## 2015-05-17 ENCOUNTER — Ambulatory Visit (INDEPENDENT_AMBULATORY_CARE_PROVIDER_SITE_OTHER)
Admission: RE | Admit: 2015-05-17 | Discharge: 2015-05-17 | Disposition: A | Discharge status: Home / Self Care | Payer: Medicaid Other | Source: Ambulatory Visit | Attending: Internal Medicine | Admitting: Internal Medicine

## 2015-05-17 ENCOUNTER — Encounter: Payer: Self-pay | Admitting: Internal Medicine

## 2015-05-17 ENCOUNTER — Ambulatory Visit (INDEPENDENT_AMBULATORY_CARE_PROVIDER_SITE_OTHER): Payer: Medicaid Other | Admitting: Internal Medicine

## 2015-05-17 VITALS — BP 112/80 | HR 63 | Ht 76.0 in | Wt 256.4 lb

## 2015-05-17 DIAGNOSIS — Z720 Tobacco use: Secondary | ICD-10-CM | POA: Diagnosis not present

## 2015-05-17 DIAGNOSIS — F17210 Nicotine dependence, cigarettes, uncomplicated: Secondary | ICD-10-CM

## 2015-05-17 DIAGNOSIS — J45901 Unspecified asthma with (acute) exacerbation: Secondary | ICD-10-CM

## 2015-05-17 DIAGNOSIS — R0600 Dyspnea, unspecified: Secondary | ICD-10-CM | POA: Diagnosis not present

## 2015-05-17 MED ORDER — MOMETASONE FURO-FORMOTEROL FUM 200-5 MCG/ACT IN AERO: INHALATION_SPRAY | RESPIRATORY_TRACT | Status: DC

## 2015-05-17 NOTE — Progress Notes (Signed)
Subjective:    Patient ID: Brent Byrd, male    DOB: 1956-09-16,   MRN: [id number]  HPI  22 yobm active smoker athlete thru HS and [employer] then around 2006 started with intermittent breathing problems occur at least once a week x 15-20 min and neg w/u at [facility] jan 2015 refererred to pulmonary clinic 05/17/2015 by Dr Jeanie Cooks with unexplained variable sob with neg w/u at [facility] 2015    05/17/2015 1st [facility] office visit/ Chamara Dyck   Chief Complaint  Patient presents with  . Pulmonary Consult    Referred by Dr. Nolene Ebbs. Pt c/o SOB on and off for the past 6 months. He states he gets SOB walking approx 3 blocks and sometimes when he is lying down. He also c/o PND and prod cough with yellow sputum.   variable sob x 10 y when at his best can still play kick ball and sprint to first base but worse breathing x 2 months  occ sob at rest assoc with congested cough and occ yellowing sputum. Does not use inhalers at present as says [facility] doc said he didn't need them  To ER 03/28/15 bad cough > dark green / cp with cough resolved s abx/ already on PPI   No obvious day to day or daytime variability or assoc  cp or chest tightness, subjective wheeze or overt sinus   symptoms. No unusual exp hx or h/o childhood pna/ asthma or knowledge of premature birth.  Sleeping is usually  ok without nocturnal  or early am exacerbation  of respiratory  c/o's or need for noct saba. Also denies any obvious fluctuation of symptoms with weather or environmental changes or other aggravating or alleviating factors except as outlined above   Current Medications, Allergies, Complete Past Medical History, Past Surgical History, Family History, and Social History were reviewed in [employer] record.        Review of Systems  Constitutional: Negative for fever, chills, activity change, appetite change and unexpected weight change.  HENT: Positive for dental problem and postnasal drip. Negative for  congestion, rhinorrhea, sneezing, sore throat, trouble swallowing and voice change.   Eyes: Negative for visual disturbance.  Respiratory: Positive for cough and shortness of breath. Negative for choking.   Cardiovascular: Negative for chest pain and leg swelling.  Gastrointestinal: Negative for nausea, vomiting and abdominal pain.  Genitourinary: Negative for difficulty urinating.       Acid heartburn  Musculoskeletal: Negative for arthralgias.  Skin: Negative for rash.  Psychiatric/Behavioral: Negative for behavioral problems and confusion.       Objective:   Physical Exam  amb bm nad  Wt Readings from Last 3 Encounters:  05/17/15 256 lb 6.4 oz (116.302 kg)  09/04/14 265 lb (120.203 kg)    Vital signs reviewed    HEENT: nl dentition, turbinates, and orophanx. Nl external ear canals without cough reflex   NECK :  without JVD/Nodes/TM/ nl carotid upstrokes bilaterally   LUNGS: no acc muscle use, clear to A and P bilaterally with exp cough and a few end exp rhonchi bilaterally    CV:  RRR  no s3 or murmur or increase in P2, no edema   ABD:  soft and nontender with nl excursion in the supine position. No bruits or organomegaly, bowel sounds nl  MS:  warm without deformities, calf tenderness, cyanosis or clubbing  SKIN: warm and dry without lesions    NEURO:  alert, approp, no deficits  CXR PA and Lateral:   05/17/2015 :     I personally reviewed images and agree with radiology impression as follows:     There is no demonstrable edema or consolidation. Heart size and pulmonary vascularity are normal. No adenopathy. Aorta is borderline prominent but stable. No bone lesions.     Assessment & Plan:

## 2015-05-17 NOTE — Patient Instructions (Addendum)
Please remember to go to the  x-ray department downstairs for your tests - we will call you with the results when they are available.    Try dulera 200 Take 2 puffs first thing in am and then another 2 puffs about 12 hours later > if better fill the rx  Please schedule a follow up office visit in 6 weeks, call sooner if needed with pfts

## 2015-05-18 ENCOUNTER — Encounter: Payer: Self-pay | Admitting: Internal Medicine

## 2015-05-18 DIAGNOSIS — F17210 Nicotine dependence, cigarettes, uncomplicated: Secondary | ICD-10-CM | POA: Insufficient documentation

## 2015-05-18 NOTE — Assessment & Plan Note (Signed)

## 2015-05-18 NOTE — Assessment & Plan Note (Signed)
Note as clear here today and probably also at previous [facility] w/u Symptoms are markedly disproportionate to objective findings and not clear this is a lung problem but pt does appear to have difficult airway management issues. DDX of  difficult airways management all start with A and  include Adherence, Ace Inhibitors, Acid Reflux, Active Sinus Disease, Alpha 1 Antitripsin deficiency, Anxiety masquerading as Airways dz,  ABPA,  allergy(esp in young), Aspiration (esp in elderly), Adverse effects of meds,  Active smokers, A bunch of PE's (a small clot burden can't cause this syndrome unless there is already severe underlying pulm or vascular dz with poor reserve) plus two Bs  = Bronchiectasis and Beta blocker use..and one C= CHF    Adherence is always the initial "prime suspect" and is a multilayered concern that requires a "trust but verify" approach in every patient - starting with knowing how to use medications, especially inhalers, correctly, keeping up with refills and understanding the fundamental difference between maintenance and prns vs those medications only taken for a very short course and then stopped and not refilled.  - need a trust but verify approach here/ rec bring all meds to all ovs  Active smoking >addressed  Allergy /asthma > addressed with dulera 200  ? Acid (or non-acid) GERD > always difficult to exclude as up to 75% of pts in some series report no assoc GI/ Heartburn symptoms> rec continue max (24h)  acid suppression and diet restrictions/ reviewed     ? Active sinus dz > consider sinus ct when returns   Anxiety not addressed, may be a large component of his problem

## 2015-05-18 NOTE — Assessment & Plan Note (Addendum)
-    Spirometry 05/17/15   FV1  2.90 (78%) ratio 68 though F/V loop not classic - 05/17/2015  Walked RA x 3 laps @ 185 ft each stopped due to  End of study, nl pace, no sob or desat    Previous nl spirometry rules against copd but doesn't rule out some AB and GOLD 0 copd which are possible to distinguish and probably should be treated the same way regardless eg stop smoking (see sep a/p) and trial of laba/ics  The proper method of use, as well as anticipated side effects, of a metered-dose inhaler are discussed and demonstrated to the patient. Improved effectiveness after extensive coaching during this visit to a level of approximately  75% > try dulera samples 200 2bid  Total time = 74m review case with pt/ discussion/ counseling/ giving and going over instructions (see avs)

## 2015-05-20 NOTE — Progress Notes (Signed)
Quick Note:  Spoke with pt and notified of results per Dr. Wert. Pt verbalized understanding and denied any questions.  ______ 

## 2015-06-20 ENCOUNTER — Ambulatory Visit: Payer: Medicaid Other | Admitting: Internal Medicine

## 2015-07-02 ENCOUNTER — Ambulatory Visit: Payer: Medicaid Other | Admitting: Internal Medicine

## 2015-07-12 ENCOUNTER — Ambulatory Visit: Payer: Medicaid Other | Admitting: Internal Medicine

## 2015-07-29 ENCOUNTER — Ambulatory Visit: Payer: Medicaid Other | Admitting: Internal Medicine

## 2015-08-12 ENCOUNTER — Ambulatory Visit: Payer: Medicaid Other | Admitting: Internal Medicine

## 2015-09-30 ENCOUNTER — Emergency Department (HOSPITAL_COMMUNITY)
Admission: EM | Admit: 2015-09-30 | Discharge: 2015-09-30 | Disposition: A | Discharge status: Home / Self Care | Payer: Medicaid Other | Attending: Physician Assistant | Admitting: Physician Assistant

## 2015-09-30 ENCOUNTER — Encounter (HOSPITAL_COMMUNITY): Payer: Self-pay | Admitting: Nurse Practitioner

## 2015-09-30 DIAGNOSIS — Z79899 Other long term (current) drug therapy: Secondary | ICD-10-CM | POA: Insufficient documentation

## 2015-09-30 DIAGNOSIS — I10 Essential (primary) hypertension: Secondary | ICD-10-CM | POA: Insufficient documentation

## 2015-09-30 DIAGNOSIS — F17210 Nicotine dependence, cigarettes, uncomplicated: Secondary | ICD-10-CM | POA: Diagnosis not present

## 2015-09-30 DIAGNOSIS — Z86718 Personal history of other venous thrombosis and embolism: Secondary | ICD-10-CM | POA: Insufficient documentation

## 2015-09-30 DIAGNOSIS — M21371 Foot drop, right foot: Secondary | ICD-10-CM | POA: Diagnosis not present

## 2015-09-30 DIAGNOSIS — K219 Gastro-esophageal reflux disease without esophagitis: Secondary | ICD-10-CM | POA: Insufficient documentation

## 2015-09-30 DIAGNOSIS — R300 Dysuria: Secondary | ICD-10-CM | POA: Diagnosis not present

## 2015-09-30 DIAGNOSIS — M109 Gout, unspecified: Secondary | ICD-10-CM | POA: Diagnosis not present

## 2015-09-30 DIAGNOSIS — Z791 Long term (current) use of non-steroidal anti-inflammatories (NSAID): Secondary | ICD-10-CM | POA: Diagnosis not present

## 2015-09-30 DIAGNOSIS — J449 Chronic obstructive pulmonary disease, unspecified: Secondary | ICD-10-CM | POA: Insufficient documentation

## 2015-09-30 DIAGNOSIS — I252 Old myocardial infarction: Secondary | ICD-10-CM | POA: Insufficient documentation

## 2015-09-30 DIAGNOSIS — R1030 Lower abdominal pain, unspecified: Secondary | ICD-10-CM | POA: Insufficient documentation

## 2015-09-30 DIAGNOSIS — R319 Hematuria, unspecified: Secondary | ICD-10-CM | POA: Diagnosis present

## 2015-09-30 LAB — URINALYSIS, ROUTINE W REFLEX MICROSCOPIC
Glucose, UA: NEGATIVE mg/dL
Hgb urine dipstick: NEGATIVE
Ketones, ur: 15 mg/dL — AB
Nitrite: NEGATIVE
Protein, ur: NEGATIVE mg/dL
Specific Gravity, Urine: 1.027 (ref 1.005–1.030)
pH: 5 (ref 5.0–8.0)

## 2015-09-30 LAB — URINE MICROSCOPIC-ADD ON: RBC / HPF: NONE SEEN RBC/hpf (ref 0–5)

## 2015-09-30 LAB — CBC
HCT: 40.7 % (ref 39.0–52.0)
Hemoglobin: 13.9 g/dL (ref 13.0–17.0)
MCH: 32.5 pg (ref 26.0–34.0)
MCHC: 34.2 g/dL (ref 30.0–36.0)
MCV: 95.1 fL (ref 78.0–100.0)
Platelets: 190 10*3/uL (ref 150–400)
RBC: 4.28 MIL/uL (ref 4.22–5.81)
RDW: 14.2 % (ref 11.5–15.5)
WBC: 4.2 10*3/uL (ref 4.0–10.5)

## 2015-09-30 LAB — BASIC METABOLIC PANEL
ANION GAP: 7 (ref 5–15)
BUN: 8 mg/dL (ref 6–20)
CO2: 23 mmol/L (ref 22–32)
Calcium: 9.6 mg/dL (ref 8.9–10.3)
Chloride: 106 mmol/L (ref 101–111)
Creatinine, Ser: 0.87 mg/dL (ref 0.61–1.24)
GFR calc Af Amer: >60 mL/min (ref >60)
Glucose, Bld: 87 mg/dL (ref 65–99)
POTASSIUM: 4.1 mmol/L (ref 3.5–5.1)
SODIUM: 136 mmol/L (ref 135–145)

## 2015-09-30 MED ORDER — CEPHALEXIN 250 MG PO CAPS
500.0000 mg | ORAL_CAPSULE | Freq: Once | ORAL | Status: AC
  Administered 2015-09-30: 500 mg via ORAL
  Filled 2015-09-30: qty 2

## 2015-09-30 MED ORDER — CEPHALEXIN 500 MG PO CAPS: 500.0000 mg | ORAL_CAPSULE | Freq: Two times a day (BID) | ORAL | Status: DC

## 2015-09-30 MED ORDER — IBUPROFEN 800 MG PO TABS
800.0000 mg | ORAL_TABLET | Freq: Once | ORAL | Status: AC
  Administered 2015-09-30: 800 mg via ORAL
  Filled 2015-09-30: qty 1

## 2015-09-30 MED ORDER — IBUPROFEN 800 MG PO TABS: 800.0000 mg | ORAL_TABLET | Freq: Three times a day (TID) | ORAL | Status: DC

## 2015-09-30 NOTE — ED Notes (Signed)
He c/o groin pain, testicular swelling and hematuria constant since Saturday. thinks he had a fever last night. Denies n/v/bowel changes. hes also been having some numbness in R foot, feels like when he had "drop foot."

## 2015-09-30 NOTE — ED Notes (Signed)
[place] provided.

## 2015-09-30 NOTE — Discharge Instructions (Signed)
Please follow up with PCP this week about your urinary symtpoms and your leg symtpoms.   Dysuria Dysuria is pain or discomfort while urinating. The pain or discomfort may be felt in the tube that carries urine out of the bladder (urethra) or in the surrounding tissue of the genitals. The pain may also be felt in the groin area, lower abdomen, and lower back. You may have to urinate frequently or have the sudden feeling that you have to urinate (urgency). Dysuria can affect both men and women, but is more common in women. Dysuria can be caused by many different things, including:  Urinary tract infection in women.  Infection of the kidney or bladder.  Kidney stones or bladder stones.  Certain sexually transmitted infections (STIs), such as chlamydia.  Dehydration.  Inflammation of the vagina.  Use of certain medicines.  Use of certain soaps or scented products that cause irritation. HOME CARE INSTRUCTIONS Watch your dysuria for any changes. The following actions may help to reduce any discomfort you are feeling:  Drink enough fluid to keep your urine clear or pale yellow.  Empty your bladder often. Avoid holding urine for long periods of time.  After a bowel movement or urination, women should cleanse from front to back, using each tissue only once.  Empty your bladder after sexual intercourse.  Take medicines only as directed by your health care provider.  If you were prescribed an antibiotic medicine, finish it all even if you start to feel better.  Avoid caffeine, tea, and alcohol. They can irritate the bladder and make dysuria worse. In men, alcohol may irritate the prostate.  Keep all follow-up visits as directed by your health care provider. This is important.  If you had any tests done to find the cause of dysuria, it is your responsibility to obtain your test results. Ask the lab or department performing the test when and how you will get your results. Talk with your  health care provider if you have any questions about your results. SEEK MEDICAL CARE IF:  You develop pain in your back or sides.  You have a fever.  You have nausea or vomiting.  You have blood in your urine.  You are not urinating as often as you usually do. SEEK IMMEDIATE MEDICAL CARE IF:  You pain is severe and not relieved with medicines.  You are unable to hold down any fluids.  You or someone else notices a change in your mental function.  You have a rapid heartbeat at rest.  You have shaking or chills.  You feel extremely weak.   This information is not intended to replace advice given to you by your health care provider. Make sure you discuss any questions you have with your health care provider.   Document Released: 07/10/2004 Document Revised: 11/02/2014 Document Reviewed: 06/07/2014 Elsevier Interactive Patient Education [employer].

## 2015-09-30 NOTE — ED Provider Notes (Signed)
CSN: [id number]     Arrival date & time 09/30/15  1341 History   First MD Initiated Contact with Patient 09/30/15 1814     Chief Complaint  Patient presents with  . Hematuria  . Groin Pain     (Consider location/radiation/quality/duration/timing/severity/associated sxs/prior Treatment) HPI   Patient is a 59 year old male presenting with multiple complaints. Patient states that on Friday he had a little pain in his bladder and noticed some red urine. It is since resolved. Patient did not eat anything red, no beats. He's had no recent changes Echo partners. He has no discharge. He had no pain in his bilateral flanks. Or back.. Patient has no history of stones. The pain has not come back.   Patient also has foot drop on the right. It had gotten better and now he reports slightly worsening.   Patient denies any chest pain, fever, back pain, urinary incontinence, bowel issues, unequal swelling, cough.  Patient also asking about whether we can get a note that says his labs are good enough to donate plasma.  Past Medical History  Diagnosis Date  . Substance abuse     Alcohol dependence Hx  . Chest pain   . Arthritis     Gout-Left knee  . COPD (chronic obstructive pulmonary disease) ([place])   . Hypertension   . Gastroesophageal reflux disease 2014  . MI, old   . DVT (deep venous thrombosis) ([facility])     Right- txed with coumadin    Past Surgical History  Procedure Laterality Date  . Spine surgery      S/P Herniated disc   Family History  Problem Relation Age of Onset  . Asthma Mother   . Cancer Father     Lung  . Cancer Sister   . Thrombosis Sister    Social History  Substance Use Topics  . Smoking status: Current Every Day Smoker -- 1.00 packs/day for 39 years    Types: Cigarettes  . Smokeless tobacco: Never Used  . Alcohol Use: 0.0 oz/week    0 Standard drinks or equivalent per week     Comment: hx of heavy drinking    Review of Systems  Constitutional: Negative for  fever and activity change.  Respiratory: Negative for cough and shortness of breath.   Cardiovascular: Negative for chest pain and palpitations.  Gastrointestinal: Negative for abdominal pain.  Genitourinary: Positive for hematuria. Negative for dysuria, urgency, discharge, scrotal swelling and testicular pain.  Musculoskeletal: Negative for arthralgias.  Allergic/Immunologic: Negative for immunocompromised state.  Neurological: Negative for seizures.  Psychiatric/Behavioral: Negative for behavioral problems and agitation.  All other systems reviewed and are negative.     Allergies  Iodine and Omnipaque  Home Medications   Prior to Admission medications   Medication Sig Start Date End Date Taking? Authorizing Provider  mometasone-formoterol (DULERA) 200-5 MCG/ACT AERO Take 2 puffs first thing in am and then another 2 puffs about 12 hours later. Patient taking differently: Inhale 2 puffs into the lungs 2 (two) times daily as needed for wheezing or shortness of breath.  05/17/15  Yes Tanda Rockers, MD  naproxen (NAPROSYN) 500 MG tablet Take 500 mg by mouth 2 (two) times daily with a meal.   Yes Historical Provider, MD  omeprazole (PRILOSEC) 20 MG capsule Take 20 mg by mouth daily.   Yes Historical Provider, MD  traZODone (DESYREL) 100 MG tablet Take 100 mg by mouth at bedtime as needed for sleep.   Yes Historical Provider, MD  BP 135/101 mmHg  Pulse 69  Temp(Src) 98.3 F (36.8 C) (Oral)  Resp 16  Ht 6\' 4"  (1.93 m)  Wt 236 lb (107.049 kg)  BMI 28.74 kg/m2  SpO2 96% Physical Exam  Constitutional: He is oriented to person, place, and time. He appears well-nourished.  HENT:  Head: Normocephalic.  Mouth/Throat: Oropharynx is clear and moist.  Eyes: Conjunctivae are normal.  Neck: No tracheal deviation present.  Cardiovascular: Normal rate.   Pulmonary/Chest: Effort normal. No stridor. No respiratory distress.  Abdominal: Soft. There is no tenderness. There is no guarding.   Musculoskeletal: Normal range of motion. He exhibits no edema.  Legs equal in size. No evidence of external pressure on peroneal nerve  4+/5 Strength on dorsiflexion. Mild decrease reproted sensation in peroneal nerve distribution. Patient able to steady ambulation  Neurological: He is oriented to person, place, and time. No cranial nerve deficit.  Skin: Skin is warm and dry. No rash noted. He is not diaphoretic.  Psychiatric: He has a normal mood and affect.  Nursing note and vitals reviewed.   ED Course  Procedures (including critical care time) Labs Review Labs Reviewed  URINALYSIS, ROUTINE W REFLEX MICROSCOPIC (NOT AT [facility]) - Abnormal; Notable for the following:    Color, Urine AMBER (*)    Bilirubin Urine SMALL (*)    Ketones, ur 15 (*)    Leukocytes, UA SMALL (*)    All other components within normal limits  URINE MICROSCOPIC-ADD ON - Abnormal; Notable for the following:    Squamous Epithelial / LPF 0-5 (*)    Bacteria, UA RARE (*)    All other components within normal limits  URINE CULTURE  CBC  BASIC METABOLIC PANEL    Imaging Review No results found. I have personally reviewed and evaluated these images and lab results as part of my medical decision-making.   EKG Interpretation None      MDM   Final diagnoses:  None   patient is a 59 year old male presenting with several complaints. There is no hematuria on our urinary evaluation. Given symtpoms and male we will treat presumtively for UTI with short course of abx.  We will recommend that he follows up with his primary care physician if he sees this happen again. As far as his foot drop. Patient's had this in the past and reports wearing a splint which is helped. Currently patient does not have complete foot drop, just mild sensation of increasing weakness and decreasing sensation of the foot. We will recommend that he follows up with his regular physician who may recommend an orthopedist, splint or physical  therapy.  Kellyn Mccary Julio Alm, MD 09/30/15 2053

## 2015-09-30 NOTE — ED Notes (Signed)
Pt departed in NAD, escorted by Jannet Askew, RN.

## 2015-10-01 LAB — URINE CULTURE

## 2015-10-04 ENCOUNTER — Encounter: Payer: Self-pay | Admitting: Gastroenterology

## 2015-10-15 ENCOUNTER — Encounter (HOSPITAL_COMMUNITY): Payer: Self-pay | Admitting: Emergency Medicine

## 2015-10-15 ENCOUNTER — Emergency Department (HOSPITAL_COMMUNITY)
Admission: EM | Admit: 2015-10-15 | Discharge: 2015-10-15 | Disposition: A | Discharge status: Home / Self Care | Payer: Medicaid Other | Attending: Emergency Medicine | Admitting: Emergency Medicine

## 2015-10-15 ENCOUNTER — Emergency Department (HOSPITAL_COMMUNITY): Payer: Medicaid Other

## 2015-10-15 DIAGNOSIS — K219 Gastro-esophageal reflux disease without esophagitis: Secondary | ICD-10-CM | POA: Insufficient documentation

## 2015-10-15 DIAGNOSIS — I10 Essential (primary) hypertension: Secondary | ICD-10-CM | POA: Insufficient documentation

## 2015-10-15 DIAGNOSIS — Z791 Long term (current) use of non-steroidal anti-inflammatories (NSAID): Secondary | ICD-10-CM | POA: Insufficient documentation

## 2015-10-15 DIAGNOSIS — F17210 Nicotine dependence, cigarettes, uncomplicated: Secondary | ICD-10-CM | POA: Insufficient documentation

## 2015-10-15 DIAGNOSIS — K002 Abnormalities of size and form of teeth: Secondary | ICD-10-CM | POA: Diagnosis not present

## 2015-10-15 DIAGNOSIS — J449 Chronic obstructive pulmonary disease, unspecified: Secondary | ICD-10-CM | POA: Diagnosis not present

## 2015-10-15 DIAGNOSIS — R202 Paresthesia of skin: Secondary | ICD-10-CM | POA: Diagnosis not present

## 2015-10-15 DIAGNOSIS — R200 Anesthesia of skin: Secondary | ICD-10-CM | POA: Insufficient documentation

## 2015-10-15 DIAGNOSIS — Z86718 Personal history of other venous thrombosis and embolism: Secondary | ICD-10-CM | POA: Insufficient documentation

## 2015-10-15 DIAGNOSIS — I252 Old myocardial infarction: Secondary | ICD-10-CM | POA: Insufficient documentation

## 2015-10-15 DIAGNOSIS — R531 Weakness: Secondary | ICD-10-CM | POA: Insufficient documentation

## 2015-10-15 DIAGNOSIS — R4182 Altered mental status, unspecified: Secondary | ICD-10-CM | POA: Insufficient documentation

## 2015-10-15 DIAGNOSIS — Z79899 Other long term (current) drug therapy: Secondary | ICD-10-CM | POA: Diagnosis not present

## 2015-10-15 DIAGNOSIS — Z792 Long term (current) use of antibiotics: Secondary | ICD-10-CM | POA: Insufficient documentation

## 2015-10-15 DIAGNOSIS — Z8739 Personal history of other diseases of the musculoskeletal system and connective tissue: Secondary | ICD-10-CM | POA: Diagnosis not present

## 2015-10-15 LAB — URINALYSIS, ROUTINE W REFLEX MICROSCOPIC
Bilirubin Urine: NEGATIVE
GLUCOSE, UA: NEGATIVE mg/dL
Hgb urine dipstick: NEGATIVE
KETONES UR: NEGATIVE mg/dL
LEUKOCYTES UA: NEGATIVE
Nitrite: NEGATIVE
PH: 6 (ref 5.0–8.0)
Protein, ur: NEGATIVE mg/dL
Specific Gravity, Urine: 1.006 (ref 1.005–1.030)

## 2015-10-15 LAB — RAPID URINE DRUG SCREEN, HOSP PERFORMED
Amphetamines: NOT DETECTED
Barbiturates: NOT DETECTED
Benzodiazepines: NOT DETECTED
COCAINE: POSITIVE — AB
OPIATES: NOT DETECTED
TETRAHYDROCANNABINOL: NOT DETECTED

## 2015-10-15 LAB — CBC
HCT: 40.9 % (ref 39.0–52.0)
Hemoglobin: 13.9 g/dL (ref 13.0–17.0)
MCH: 32.7 pg (ref 26.0–34.0)
MCHC: 34 g/dL (ref 30.0–36.0)
MCV: 96.2 fL (ref 78.0–100.0)
PLATELETS: 178 10*3/uL (ref 150–400)
RBC: 4.25 MIL/uL (ref 4.22–5.81)
RDW: 13.9 % (ref 11.5–15.5)
WBC: 4.1 10*3/uL (ref 4.0–10.5)

## 2015-10-15 LAB — COMPREHENSIVE METABOLIC PANEL
ALBUMIN: 3.9 g/dL (ref 3.5–5.0)
ALT: 17 U/L (ref 17–63)
ANION GAP: 12 (ref 5–15)
AST: 32 U/L (ref 15–41)
Alkaline Phosphatase: 46 U/L (ref 38–126)
BILIRUBIN TOTAL: 0.6 mg/dL (ref 0.3–1.2)
BUN: <5 mg/dL — ABNORMAL LOW (ref 6–20)
CO2: 22 mmol/L (ref 22–32)
Calcium: 8.9 mg/dL (ref 8.9–10.3)
Chloride: 107 mmol/L (ref 101–111)
Creatinine, Ser: 0.74 mg/dL (ref 0.61–1.24)
GFR calc Af Amer: >60 mL/min (ref >60)
GFR calc non Af Amer: >60 mL/min (ref >60)
GLUCOSE: 84 mg/dL (ref 65–99)
POTASSIUM: 4.1 mmol/L (ref 3.5–5.1)
SODIUM: 141 mmol/L (ref 135–145)
TOTAL PROTEIN: 6.6 g/dL (ref 6.5–8.1)

## 2015-10-15 LAB — PROTIME-INR
INR: 1.06 (ref 0.00–1.49)
PROTHROMBIN TIME: 14 s (ref 11.6–15.2)

## 2015-10-15 LAB — I-STAT CHEM 8, ED
BUN: 4 mg/dL — ABNORMAL LOW (ref 6–20)
Calcium, Ion: 0.93 mmol/L — ABNORMAL LOW (ref 1.12–1.23)
Chloride: 109 mmol/L (ref 101–111)
Creatinine, Ser: 0.8 mg/dL (ref 0.61–1.24)
Glucose, Bld: 77 mg/dL (ref 65–99)
HEMATOCRIT: 44 % (ref 39.0–52.0)
HEMOGLOBIN: 15 g/dL (ref 13.0–17.0)
Potassium: 4.8 mmol/L (ref 3.5–5.1)
SODIUM: 139 mmol/L (ref 135–145)
TCO2: 20 mmol/L (ref 0–100)

## 2015-10-15 LAB — DIFFERENTIAL
Basophils Absolute: 0 10*3/uL (ref 0.0–0.1)
Basophils Relative: 1 %
EOS ABS: 0.2 10*3/uL (ref 0.0–0.7)
EOS PCT: 4 %
Lymphocytes Relative: 39 %
Lymphs Abs: 1.6 10*3/uL (ref 0.7–4.0)
Monocytes Absolute: 0.4 10*3/uL (ref 0.1–1.0)
Monocytes Relative: 9 %
NEUTROS PCT: 47 %
Neutro Abs: 1.9 10*3/uL (ref 1.7–7.7)

## 2015-10-15 LAB — I-STAT TROPONIN, ED: Troponin i, poc: 0.01 ng/mL (ref 0.00–0.08)

## 2015-10-15 LAB — ETHANOL: Alcohol, Ethyl (B): 128 mg/dL — ABNORMAL HIGH (ref <5)

## 2015-10-15 LAB — APTT: aPTT: 28 seconds (ref 24–37)

## 2015-10-15 NOTE — ED Provider Notes (Signed)
  Physical Exam  BP 136/96 mmHg  Pulse 53  Temp(Src) 97.8 F (36.6 C) (Oral)  Resp 20  SpO2 96%  Physical Exam  ED Course  Procedures  MDM  Consult with neuro pending. Possible admission to gen med.   Neuro saw patient, advised OP followup. Patient ambulating at baseline. Can be safely discharged home. Discussed reasons for return with patient and counseled on illicit drug abuse.  See initial ED notes by Dr. Marijean Bravo and Dr. Oleta Mouse.    Inverness Lions, PA-C 10/20/15 2356  Forde Dandy, MD 10/23/15 1420

## 2015-10-15 NOTE — ED Notes (Signed)
neurology at  bedside

## 2015-10-15 NOTE — ED Notes (Signed)
Per ems-- lsn at 1am today by roommate. Pt boss found pt in fetal position in bed today after not showing up at work. Pt seen here last week for stoke according to friends. Pt was pointing to R leg but pt able to bend leg and stand. Pt was altered with ems- and difficult to answer questions. Pt smells of etoh. Stroke screen negative. cbg 110.

## 2015-10-15 NOTE — ED Provider Notes (Signed)
I saw and evaluated the patient, reviewed the resident's note and I agree with the findings and plan.   EKG Interpretation   Date/Time:  Tuesday October 15 2015 15:43:14 EST Ventricular Rate:  57 PR Interval:  201 QRS Duration: 104 QT Interval:  452 QTC Calculation: 440 R Axis:   26 Text Interpretation:  Sinus rhythm Borderline T abnormalities, inferior  leads Baseline wander in lead(s) V2 No significant change since Confirmed  by Kaliana Albino MD, Rube Sanchez [phone]) on 10/15/2015 4:00:57 PM      59 year old male with history of polysubstance abuse, COPD and CAD who presents with right-sided weakness and altered mental status. History is provided by his friend who states that he saw his friend at 1 AM in the morning. Endorsed usage of crack cocaine and alcohol. His friend states that he did not show up to work today, and when his boss came to look for him today he was found in fetal position in bed. The patient states that he does not remember this, and had woken up feeling tired. States that he had a beer today. States that he currently has new right-sided numbness and weakness, but is unable to elaborate when this may have started for him. On presentation, he has not concerning vital signs. He is poorly cooperative with the neurological exam. He primarily has giveaway weakness, first refusing to move his arm or leg, but then withdraws to pain, and appears to move extremities symmetrically when distracted. Tox screen is positive for alcohol and cocaine. No other major metabolic or electrolyte derangements. He is a negative CT head. Neurology also evaluated this patient, who feels that his symptoms are primarily giveaway and not true neurological deficits. He did not recommend MRI or further workup for TIA/stroke. Findings were discussed with the patient, and he is able to ambulate at his baseline now. He is felt to be appropriate for discharge home. Is given outpatient neurological referral for workup of a right  foot drop that he reports having for quite some time now. Strict return and follow-up instructions reviewed. He expressed understanding of all discharge instructions and felt comfortable with the plan of care.   Forde Dandy, MD 10/15/15 2027

## 2015-10-15 NOTE — ED Notes (Signed)
Ambulated pt on the unit pt complains of R leg pain numbness and weakness no other complaints noted at this time RN aware

## 2015-10-15 NOTE — Discharge Instructions (Signed)
Mr. Brent Byrd,  Please follow-up with neurology regarding your foot drop. Return to the emergency department if you develop fevers, chills, loss of bladder/bowel control, increased inability to walk. Feel better soon!  S. Wendie Simmer, PA-C

## 2015-10-15 NOTE — ED Notes (Signed)
Pt transported to CT ?

## 2015-10-15 NOTE — Consult Note (Signed)
Requesting Physician: Dr.Liu    Chief Complaint: Called to evaluate R sided weakness and abnormal sensory deficit, arm and leg  HPI:                                                                                                                                         Brent Byrd is an 59 y.o. male with hx of COPD, HTN, ETOH use, polysubstance abuse who presents today after being found by his friend in the fetal position unable to fully use his R arm and leg and complaining of numbness and tingling on the R hemibody  Date last known well: Date: 10/15/2015 Time last known well: Time: 01:00 tPA Given: No: outisde of TPA window No Symptoms         0 No significant disability/able to carry out all usual activities   1 Unable to carry out all previous activities but looks after own affairs             2 Requires help but walks without assistance     3 Unable to walk without assistance/unable to handle own bodily needs 4 Bedridden/incontinent        5 Dead          6  Modified Rankin: Rankin Score=0    Past Medical History  Diagnosis Date  . Substance abuse     Alcohol dependence Hx  . Chest pain   . Arthritis     Gout-Left knee  . COPD (chronic obstructive pulmonary disease) ([place])   . Hypertension   . Gastroesophageal reflux disease 2014  . MI, old   . DVT (deep venous thrombosis) ([facility])     Right- txed with coumadin     Past Surgical History  Procedure Laterality Date  . Spine surgery      S/P Herniated disc    Family History  Problem Relation Age of Onset  . Asthma Mother   . Cancer Father     Lung  . Cancer Sister   . Thrombosis Sister    Social History:  reports that he has been smoking Cigarettes.  He has a 39 pack-year smoking history. He has never used smokeless tobacco. He reports that he drinks alcohol. He reports that he uses illicit drugs ("Crack" cocaine).  Allergies:  Allergies  Allergen Reactions  . Iodine Hives and Other (See Comments)  . Omnipaque  [Iohexol] Other (See Comments)    IV Dye-Sneezing    Medications:  I have reviewed the patient's current medications.  ROS:                                                                                                                                       History obtained from the patient  General ROS: negative for - chills, fatigue, fever, night sweats, weight gain or weight loss Psychological ROS: negative for - behavioral disorder, hallucinations, memory difficulties, mood swings or suicidal ideation Ophthalmic ROS: negative for - blurry vision, double vision, eye pain or loss of vision ENT ROS: negative for - epistaxis, nasal discharge, oral lesions, sore throat, tinnitus or vertigo Allergy and Immunology ROS: negative for - hives or itchy/watery eyes Hematological and Lymphatic ROS: negative for - bleeding problems, bruising or swollen lymph nodes Endocrine ROS: negative for - galactorrhea, hair pattern changes, polydipsia/polyuria or temperature intolerance Respiratory ROS: negative for - cough, hemoptysis, shortness of breath or wheezing Cardiovascular ROS: negative for - chest pain, dyspnea on exertion, edema or irregular heartbeat Gastrointestinal ROS: negative for - abdominal pain, diarrhea, hematemesis, nausea/vomiting or stool incontinence Genito-Urinary ROS: negative for - dysuria, hematuria, incontinence or urinary frequency/urgency Musculoskeletal ROS: negative for - joint swelling or muscular weakness Neurological ROS: as noted in HPI Dermatological ROS: negative for rash and skin lesion changes  Neurologic Examination:                                                                                                      Blood pressure 136/96, pulse 53, temperature 97.8 F (36.6 C), temperature source Oral, resp. rate 20, SpO2 96 %.  HEENT-   Normocephalic, no lesions, without obvious abnormality.  Normal external eye and conjunctiva.  Normal TM's bilaterally.  Normal auditory canals and external ears. Normal external nose, mucus membranes and septum.  Normal pharynx. Cardiovascular- regular rate and rhythm, S1, S2 normal, no murmur, click, rub or gallop, pulses palpable throughout   Lungs- chest clear, no wheezing, rales, normal symmetric air entry, Heart exam - S1, S2 normal, no murmur, no gallop, rate regular Abdomen- soft, non-tender; bowel sounds normal; no masses,  no organomegaly Extremities- no edema Lymph-no adenopathy palpable Musculoskeletal-no joint tenderness, deformity or swelling Skin-warm and dry, no hyperpigmentation, vitiligo, or suspicious lesions  Neurological Examination Mental Status: Alert, oriented, thought content appropriate.  Speech fluent without evidence of aphasia.  Able to follow 3 step commands without difficulty. Cranial Nerves: II: Discs flat bilaterally; Visual fields grossly normal, pupils equal, round, reactive to light and accommodation III,IV,  VI: ptosis not present, extra-ocular motions intact bilaterally V,VII: smile symmetric, facial light touch sensation normal bilaterally VIII: hearing normal bilaterally IX,X: uvula rises symmetrically XI: bilateral shoulder shrug XII: midline tongue extension Motor: Right : Upper extremity   4/5-5/5, give away weakness    Left:     Upper extremity   5/5  Lower extremity   4/5-5/5 give away weakness, hoover postive      Lower extremity   5/5 Tone and bulk:normal tone throughout; no atrophy noted Sensory: Pinprick and light touch diminished on the R  Deep Tendon Reflexes: 2+ and symmetric throughout Plantars: Right: downgoing   Left: downgoing Cerebellar: normal finger-to-nose, normal rapid alternating movements and normal heel-to-shin test        Lab Results: Basic Metabolic Panel:  Recent Labs Lab 10/15/15 1719  NA 139  K 4.8  CL 109   GLUCOSE 77  BUN 4*  CREATININE 0.80    Liver Function Tests: No results for input(s): AST, ALT, ALKPHOS, BILITOT, PROT, ALBUMIN in the last 168 hours. No results for input(s): LIPASE, AMYLASE in the last 168 hours. No results for input(s): AMMONIA in the last 168 hours.  CBC:  Recent Labs Lab 10/15/15 1719  WBC 4.1  NEUTROABS 1.9  HGB 13.9  15.0  HCT 40.9  44.0  MCV 96.2  PLT 178    Cardiac Enzymes: No results for input(s): CKTOTAL, CKMB, CKMBINDEX, TROPONINI in the last 168 hours.  Lipid Panel: No results for input(s): CHOL, TRIG, HDL, CHOLHDL, VLDL, LDLCALC in the last 168 hours.  CBG: No results for input(s): GLUCAP in the last 168 hours.  Microbiology: Results for orders placed or performed during the hospital encounter of 09/30/15  Urine culture     Status: None   Collection Time: 09/30/15  7:00 PM  Result Value Ref Range Status   Specimen Description URINE, CLEAN CATCH  Final   Special Requests NONE  Final   Culture 1,000 COLONIES/mL INSIGNIFICANT GROWTH  Final   Report Status 10/01/2015 FINAL  Final    Coagulation Studies: No results for input(s): LABPROT, INR in the last 72 hours.  Imaging: Ct Head Wo Contrast  10/15/2015  CLINICAL DATA:  Right-sided numbness and weakness EXAM: CT HEAD WITHOUT CONTRAST TECHNIQUE: Contiguous axial images were obtained from the base of the skull through the vertex without intravenous contrast. COMPARISON:  08/19/2014 FINDINGS: There is no evidence of mass effect, midline shift or extra-axial fluid collections. There is no evidence of a space-occupying lesion or intracranial hemorrhage. There is no evidence of a cortical-based area of acute infarction. The ventricles and sulci are appropriate for the patient's age. The basal cisterns are patent. Visualized portions of the orbits are unremarkable. Near complete opacification of the left maxillary sinus. Mild bilateral ethmoid sinus and left frontal sinus mucosal thickening.  The osseous structures are unremarkable. IMPRESSION: 1. No acute intracranial pathology. 2. Sinus disease as described above. Electronically Signed   By: Kathreen Devoid   On: 10/15/2015 16:47     Assessment: 59 y.o. male hx of COPD, HTN, ETOH use, polysubstance abuse who presents today after being found by his friend in the fetal position unable to fully use his R arm and leg and complaining of numbness and tingling on the R hemibody  Patient shows give away weakness on exam in his RUE and LLE, hoover test is positive. May have true R foot drop but difficult to assess because he is not giving full effort. States its been going on  for over a month. Would benefit from a splint on his foot and an outpatient EMG to determine if it is footdrop. Would not recommend any further imagining at this time due to psychogenic weakness on examination and very low possibility of stroke. The numbness he states is all up his arm and leg but not his face but his exam is not consistent and difficult to localize for a true sensory level.  Would recommend UDS, his friend stated when he saw him at 3pm his pupils were dialated with a blank stare, unclear if substance abuse is at play here.  Stroke Risk Factors - smoking

## 2015-10-15 NOTE — ED Provider Notes (Signed)
CSN: [id number]     Arrival date & time 10/15/15  1532 History   First MD Initiated Contact with Patient 10/15/15 1536     Chief Complaint  Patient presents with  . Altered Mental Status     (Consider location/radiation/quality/duration/timing/severity/associated sxs/prior Treatment) Patient is a 59 y.o. male presenting with altered mental status.  Altered Mental Status Associated symptoms: weakness   Associated symptoms: no seizures      Mr. Lesher is a 59 year old man with a PMH of polysubstance abuse (alcohol, cocaine), COPD, and previous MI who presents with altered mental status and right-sided weakness and numbness. His last known normal was at 1:00 AM this morning. He was supposed to be at work today, and when he did not show, his boss went to his residence and found him in the fetal position on the floor. At that time, he reportedly smelled of alcohol. When questioned, he had very poor memory of recent events and seemed surprised that it was December 20th. He kept asking where his wife and daughter were, and said he could not recall his daughter's name. He could recall his own name, but he had no idea where he was. He reports that his last alcoholic drink was "a beer this morning." I spoke with his friend, Ron, who said that he had been smoking crack and drinking at around 1:00AM today, and he appeared to be at his baseline at that time. Ron had seen him after the onset of his symptoms in his apartment, and he said that he could not recognize him and had slurred words.  Past Medical History  Diagnosis Date  . Substance abuse     Alcohol dependence Hx  . Chest pain   . Arthritis     Gout-Left knee  . COPD (chronic obstructive pulmonary disease) ([place])   . Hypertension   . Gastroesophageal reflux disease 2014  . MI, old   . DVT (deep venous thrombosis) ([facility])     Right- txed with coumadin    Past Surgical History  Procedure Laterality Date  . Spine surgery      S/P Herniated  disc   Family History  Problem Relation Age of Onset  . Asthma Mother   . Cancer Father     Lung  . Cancer Sister   . Thrombosis Sister    Social History  Substance Use Topics  . Smoking status: Current Every Day Smoker -- 1.00 packs/day for 39 years    Types: Cigarettes  . Smokeless tobacco: Never Used  . Alcohol Use: 0.0 oz/week    0 Standard drinks or equivalent per week     Comment: hx of heavy drinking    Review of Systems  Respiratory: Negative for shortness of breath.   Cardiovascular: Negative for chest pain.  Neurological: Positive for weakness and numbness. Negative for dizziness, seizures and syncope.    Difficulty obtaining extensive ROS due to patient confusion.    Allergies  Iodine and Omnipaque  Home Medications   Prior to Admission medications   Medication Sig Start Date End Date Taking? Authorizing Provider  cephALEXin (KEFLEX) 500 MG capsule Take 1 capsule (500 mg total) by mouth 2 (two) times daily. 09/30/15   Courteney Lyn Mackuen, MD  ibuprofen (ADVIL,MOTRIN) 800 MG tablet Take 1 tablet (800 mg total) by mouth 3 (three) times daily. 09/30/15   Courteney Lyn Mackuen, MD  mometasone-formoterol (DULERA) 200-5 MCG/ACT AERO Take 2 puffs first thing in am and then another 2 puffs  about 12 hours later. Patient taking differently: Inhale 2 puffs into the lungs 2 (two) times daily as needed for wheezing or shortness of breath.  05/17/15   Tanda Rockers, MD  naproxen (NAPROSYN) 500 MG tablet Take 500 mg by mouth 2 (two) times daily with a meal.    Historical Provider, MD  omeprazole (PRILOSEC) 20 MG capsule Take 20 mg by mouth daily.    Historical Provider, MD  traZODone (DESYREL) 100 MG tablet Take 100 mg by mouth at bedtime as needed for sleep.    Historical Provider, MD   BP 136/96 mmHg  Pulse 53  Temp(Src) 97.8 F (36.6 C) (Oral)  Resp 20  SpO2 96% Physical Exam  HENT:  Head: Normocephalic and atraumatic.  Mouth/Throat: Oropharynx is clear and moist.  No oropharyngeal exudate.  Poor dentition.  Eyes: EOM are normal. Pupils are equal, round, and reactive to light. No scleral icterus.  Neck:  No carotid bruits.  Cardiovascular: Normal rate, regular rhythm and normal heart sounds.   Pulmonary/Chest: Effort normal and breath sounds normal. No respiratory distress. He has no wheezes.  Abdominal: Soft. Bowel sounds are normal. He exhibits no distension. There is no tenderness. There is no rebound.  Musculoskeletal: He exhibits no edema or tenderness.  Lymphadenopathy:    He has no cervical adenopathy.  Neurological:  Oriented to person only. Able to repeat "no ifs, ands, or buts." No aphasia or dysarthria. Tongue midline. Face symmetric. No light touch sensation in RLE, diminished in RUE. Normal sensation on left. Strength 4-/5 RUE, 5/5 LUE, 1/5 RLE (says he's unable to move it, but withdraws to painful stimulation). Difficulty participating in [place], HTS.  Skin: Skin is warm and dry. No rash noted.  Psychiatric:  Appearing bewildered.  Vitals reviewed.   ED Course  Procedures (including critical care time) Labs Review Labs Reviewed  ETHANOL  PROTIME-INR  APTT  CBC  DIFFERENTIAL  COMPREHENSIVE METABOLIC PANEL  URINE RAPID DRUG SCREEN, HOSP PERFORMED  URINALYSIS, ROUTINE W REFLEX MICROSCOPIC (NOT AT [facility])  I-STAT CHEM 8, ED  I-STAT TROPOININ, ED    Imaging Review No results found. I have personally reviewed and evaluated these images and lab results as part of my medical decision-making.   EKG Interpretation   Date/Time:  Tuesday October 15 2015 15:43:14 EST Ventricular Rate:  57 PR Interval:  201 QRS Duration: 104 QT Interval:  452 QTC Calculation: 440 R Axis:   26 Text Interpretation:  Sinus rhythm Borderline T abnormalities, inferior  leads Baseline wander in lead(s) V2 No significant change since Confirmed  by LIU MD, DANA [id number]) on 10/15/2015 4:00:50 PM      MDM   Final diagnoses:  None   Apparent focal  deficits on exam with left sided weakness and numbness. CT head demonstrated to acute intracranial abnormality. UDS, UA, ethanol level pending. Neurology consulted.  Neurology examined patient and surmised most of his symptoms were psychogenic. Given that his urine was cocaine positive and his Ethanol level was 128, it was thought that substances may have been a contributing factor. Neurology did not recommend any MRI, but thought outpatient workup of a longstanding right foot drop may be warranted. No electrolyte abnormalities or signs of infection to explain his AMS. Strict return and follow-up instructions reviewed. He expressed understanding of all discharge instructions and felt comfortable with the plan of care.   Liberty Handy, MD 10/15/15 Dat[place] Liu, MD 10/16/15 Lupita Shutter

## 2015-11-06 ENCOUNTER — Telehealth: Payer: Self-pay | Admitting: *Deleted

## 2015-11-06 NOTE — Telephone Encounter (Signed)
Dr Fuller Plan, I was reviewing this gentleman's chart for a PV that's scheduled for 1-17 Tuesday for a direct colon with you on 1-31 Tuesday . 10-15-2015 he was seen in the ED for altered mental status and they questioned CVA.  He tested positive for cocaine and alcohol, neuro screened pt and thought all to be from the cocaine and alcohol. He had a negative head CT, neuro did not advise MRI or any further stroke work up however pt has a noted foot drop for some time that neuro wants to evaluate. Pt has no pending appointments with neurology scheduled.  He also has a history of an old MI but does not mention when.   Does he need to see neuro before we do his colon or is it okay to [roceed with [place]?  Please advise Thanks, Marijean Niemann

## 2015-11-06 NOTE — Telephone Encounter (Signed)
I reviewed the Neuro consult on 12/20. He is ok for direct colonoscopy but should be encouraged to have Neuro follow up.

## 2015-11-07 NOTE — Telephone Encounter (Signed)
Noted. Will proceed as scheduled  Thanks a lot, [employer]

## 2015-11-12 ENCOUNTER — Ambulatory Visit: Payer: Medicaid Other | Admitting: *Deleted

## 2015-11-19 ENCOUNTER — Encounter: Payer: Self-pay | Admitting: Internal Medicine

## 2015-11-26 ENCOUNTER — Encounter: Payer: Medicaid Other | Admitting: Gastroenterology

## 2016-01-06 ENCOUNTER — Ambulatory Visit: Payer: Medicaid Other | Admitting: Physical Therapy

## 2016-01-08 ENCOUNTER — Encounter: Payer: Self-pay | Admitting: Gastroenterology

## 2016-01-13 ENCOUNTER — Ambulatory Visit: Payer: Medicaid Other | Admitting: Rehabilitative and Restorative Service Providers"

## 2016-01-21 ENCOUNTER — Ambulatory Visit: Payer: Medicaid Other | Admitting: Rehabilitative and Restorative Service Providers"

## 2016-01-22 ENCOUNTER — Ambulatory Visit: Payer: Medicaid Other | Admitting: Physical Therapy

## 2016-02-03 IMAGING — CR DG CHEST 1V PORT
1 series · 1 of 1 positions shown · non-contrast
Comparison: PA and lateral chest 02/25/2014 and 07/29/2013. CT
chest 02/27/2013.

CLINICAL DATA: Chest pain.

EXAM:
PORTABLE CHEST - 1 VIEW

[ap]
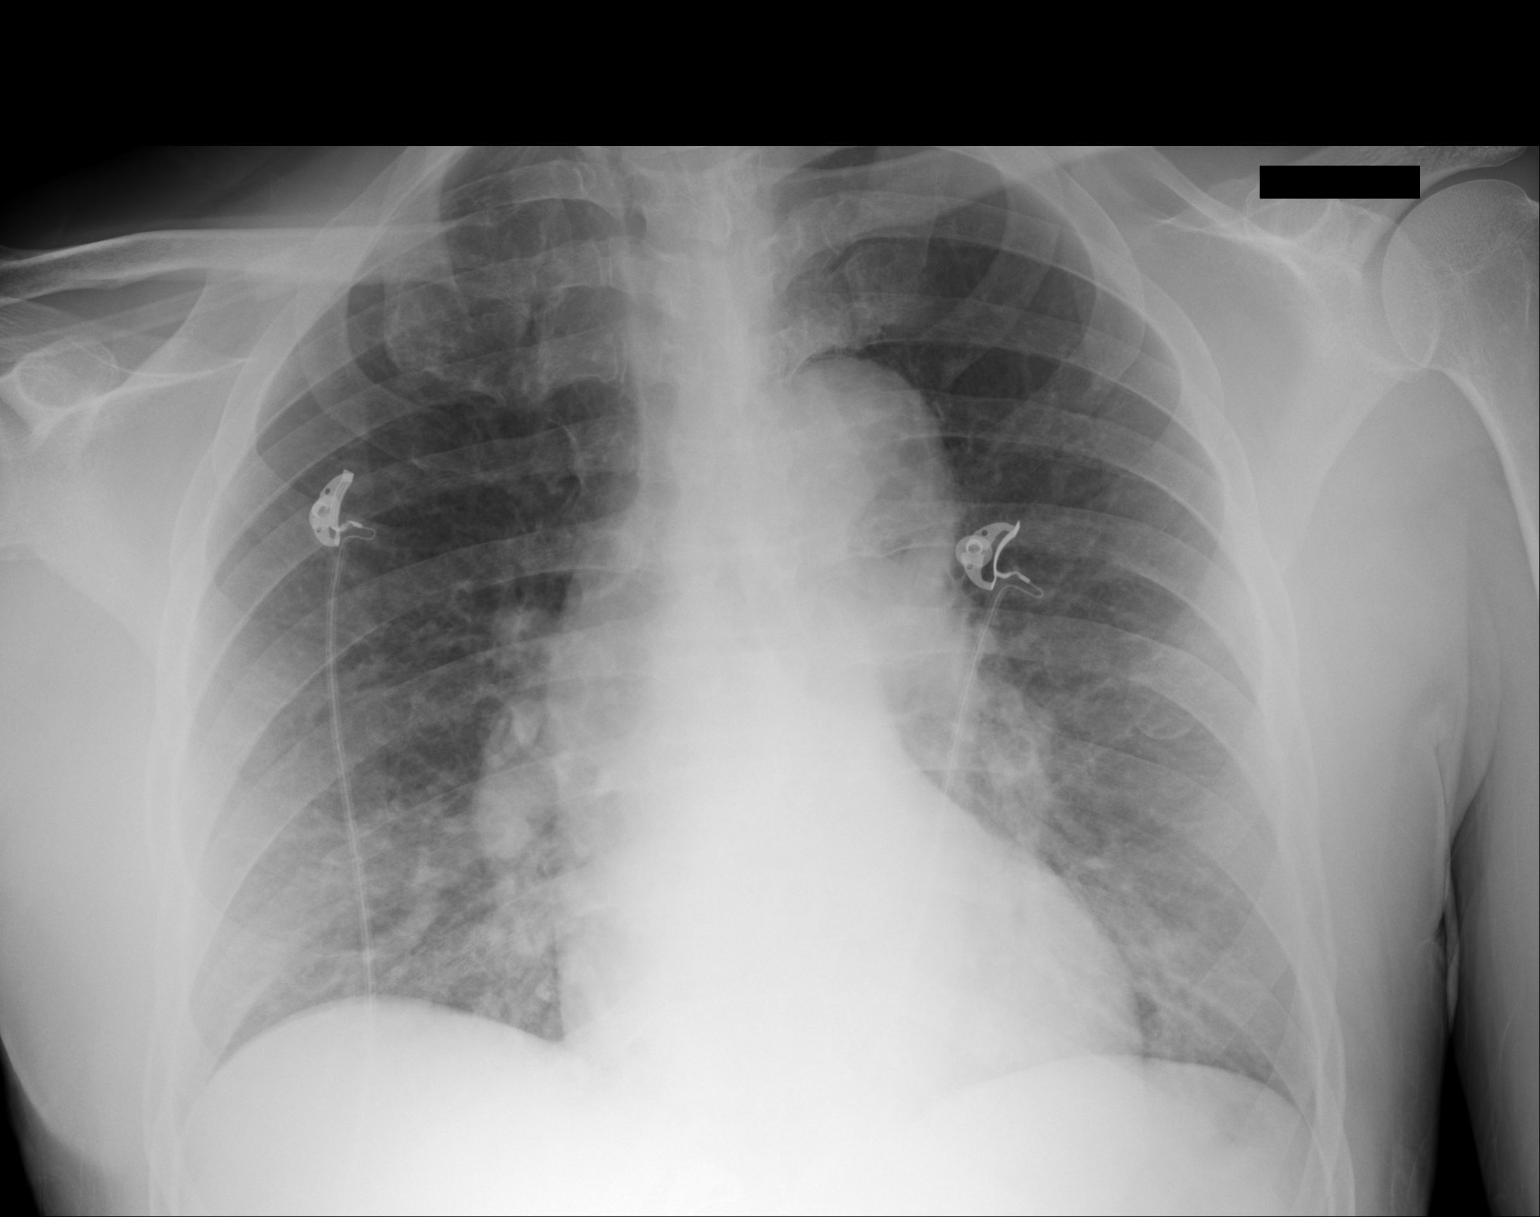

[1 of 1 positions shown; findings below may reference images not displayed]

FINDINGS: The lungs are emphysematous with mild basilar atelectasis. No
pneumothorax or pleural effusion. Heart size is normal.
IMPRESSION: Emphysema without acute disease.

## 2016-02-25 ENCOUNTER — Ambulatory Visit (AMBULATORY_SURGERY_CENTER): Payer: Self-pay | Admitting: *Deleted

## 2016-02-25 VITALS — Ht 76.0 in | Wt 244.6 lb

## 2016-02-25 DIAGNOSIS — Z1211 Encounter for screening for malignant neoplasm of colon: Secondary | ICD-10-CM

## 2016-02-25 MED ORDER — SUPREP BOWEL PREP KIT 17.5-3.13-1.6 GM/177ML PO SOLN: 1.0000 | Freq: Once | ORAL | Status: DC

## 2016-02-25 NOTE — Progress Notes (Signed)
Patient denies any allergies to egg or soy products. Patient denies complications with anesthesia/sedation.  Patient denies oxygen use at home and denies diet medications. Emmi instructions for colonoscopy but patient denied.  Pamphlet given.  Poor historian.

## 2016-03-10 ENCOUNTER — Ambulatory Visit (AMBULATORY_SURGERY_CENTER): Payer: Medicaid Other | Admitting: Gastroenterology

## 2016-03-10 ENCOUNTER — Encounter: Payer: Self-pay | Admitting: Gastroenterology

## 2016-03-10 VITALS — BP 116/72 | HR 67 | Temp 97.5°F | Resp 18 | Ht 76.0 in | Wt 244.0 lb

## 2016-03-10 DIAGNOSIS — Z860100 Personal history of colon polyps, unspecified: Secondary | ICD-10-CM

## 2016-03-10 DIAGNOSIS — D123 Benign neoplasm of transverse colon: Secondary | ICD-10-CM | POA: Diagnosis not present

## 2016-03-10 DIAGNOSIS — Z1211 Encounter for screening for malignant neoplasm of colon: Secondary | ICD-10-CM

## 2016-03-10 HISTORY — DX: Personal history of colon polyps, unspecified: Z86.0100

## 2016-03-10 MED ORDER — SODIUM CHLORIDE 0.9 % IV SOLN: 500.0000 mL | INTRAVENOUS | Status: DC

## 2016-03-10 NOTE — Progress Notes (Signed)
Patient awakening,vss,report to rn 

## 2016-03-10 NOTE — Patient Instructions (Signed)
YOU HAD AN ENDOSCOPIC PROCEDURE TODAY AT [facility]:   Refer to the procedure report that was given to you for any specific questions about what was found during the examination.  If the procedure report does not answer your questions, please call your gastroenterologist to clarify.  If you requested that your care partner not be given the details of your procedure findings, then the procedure report has been included in a sealed envelope for you to review at your convenience later.  YOU SHOULD EXPECT: Some feelings of bloating in the abdomen. Passage of more gas than usual.  Walking can help get rid of the air that was put into your GI tract during the procedure and reduce the bloating. If you had a lower endoscopy (such as a colonoscopy or flexible sigmoidoscopy) you may notice spotting of blood in your stool or on the toilet paper. If you underwent a bowel prep for your procedure, you may not have a normal bowel movement for a few days.  Please Note:  You might notice some irritation and congestion in your nose or some drainage.  This is from the oxygen used during your procedure.  There is no need for concern and it should clear up in a day or so.  SYMPTOMS TO REPORT IMMEDIATELY:   Following lower endoscopy (colonoscopy or flexible sigmoidoscopy):  Excessive amounts of blood in the stool  Significant tenderness or worsening of abdominal pains  Swelling of the abdomen that is new, acute  Fever of 100F or higher    For urgent or emergent issues, a gastroenterologist can be reached at any hour by calling [phone].   DIET: Your first meal following the procedure should be a small meal and then it is ok to progress to your normal diet. Heavy or fried foods are harder to digest and may make you feel nauseous or bloated.  Likewise, meals heavy in dairy and vegetables can increase bloating.  Drink plenty of fluids but you should avoid alcoholic beverages for 24  hours.  ACTIVITY:  You should plan to take it easy for the rest of today and you should NOT DRIVE or use heavy machinery until tomorrow (because of the sedation medicines used during the test).    FOLLOW UP: Our staff will call the number listed on your records the next business day following your procedure to check on you and address any questions or concerns that you may have regarding the information given to you following your procedure. If we do not reach you, we will leave a message.  However, if you are feeling well and you are not experiencing any problems, there is no need to return our call.  We will assume that you have returned to your regular daily activities without incident.  If any biopsies were taken you will be contacted by phone or by letter within the next 1-3 weeks.  Please call us at [phone] if you have not heard about the biopsies in 3 weeks.    SIGNATURES/CONFIDENTIALITY: You and/or your care partner have signed paperwork which will be entered into your electronic medical record.  These signatures attest to the fact that that the information above on your After Visit Summary has been reviewed and is understood.  Full responsibility of the confidentiality of this discharge information lies with you and/or your care-partner.   Resume medications. Information given on polyps,diverticulosis and high fiber diet. 

## 2016-03-10 NOTE — Op Note (Signed)
[place] Patient Name: Brent Byrd Procedure Date: 03/10/2016 10:57 AM MRN: [id number] Endoscopist: Ladene Artist , MD Age: 60 Referring MD:  Date of Birth: 1956-01-17 Gender: Male Procedure:                Colonoscopy Indications:              Screening for colorectal malignant neoplasm Medicines:                Monitored Anesthesia Care Procedure:                Pre-Anesthesia Assessment:                           - Prior to the procedure, a History and Physical                            was performed, and patient medications and                            allergies were reviewed. The patient's tolerance of                            previous anesthesia was also reviewed. The risks                            and benefits of the procedure and the sedation                            options and risks were discussed with the patient.                            All questions were answered, and informed consent                            was obtained. Prior Anticoagulants: The patient has                            taken no previous anticoagulant or antiplatelet                            agents. ASA Grade Assessment: II - A patient with                            mild systemic disease. After reviewing the risks                            and benefits, the patient was deemed in                            satisfactory condition to undergo the procedure.                           After obtaining informed consent, the colonoscope  was passed under direct vision. Throughout the                            procedure, the patient's blood pressure, pulse, and                            oxygen saturations were monitored continuously. The                            Model PCF-[id number]) scope was introduced                            through the anus and advanced to the the cecum,                            identified by appendiceal orifice and  ileocecal                            valve. The colonoscopy was performed without                            difficulty. The patient tolerated the procedure                            well. The quality of the bowel preparation was                            adequate. The ileocecal valve, appendiceal orifice,                            and rectum were photographed. Scope In: 11:05:32 AM Scope Out: 11:22:42 AM Scope Withdrawal Time: 0 hours 15 minutes 8 seconds  Total Procedure Duration: 0 hours 17 minutes 10 seconds  Findings:                 The digital rectal exam was normal.                           A 5 mm polyp was found in the transverse colon. The                            polyp was sessile. The polyp was removed with a                            cold biopsy forceps. Resection and retrieval were                            complete.                           Multiple small-mouthed diverticula were found in                            the sigmoid colon and descending colon. There was  no evidence of diverticular bleeding.                           A few small-mouthed diverticula were found in the                            transverse colon. There was no evidence of                            diverticular bleeding.                           The exam was otherwise normal throughout the                            examined colon.                           The retroflexed view of the distal rectum and anal                            verge was normal and showed no anal or rectal                            abnormalities. Complications:            No immediate complications. Estimated Blood Loss:     Estimated blood loss: none. Impression:               - One 5 mm polyp in the transverse colon, removed                            with a cold biopsy forceps. Resected and retrieved.                           - Mild diverticulosis in the sigmoid colon and in                             the descending colon.                           - Mild diverticulosis in the transverse colon. Recommendation:           - Patient has a contact number available for                            emergencies. The signs and symptoms of potential                            delayed complications were discussed with the                            patient. Return to normal activities tomorrow.                            Written discharge instructions were provided to  the                            patient.                           - High fiber diet indefinitely.                           - Continue present medications.                           - Await pathology results.                           - Repeat colonoscopy in 5 years for surveillance if                            polyp is precancerous, otherwise 10 years for                            screening. Ladene Artist, MD 03/10/2016 11:27:20 AM This report has been signed electronically.

## 2016-03-10 NOTE — Progress Notes (Signed)
Called to room to assist during endoscopic procedure.  Patient ID and intended procedure confirmed with present staff. Received instructions for my participation in the procedure from the performing physician.  

## 2016-03-11 ENCOUNTER — Telehealth: Payer: Self-pay | Admitting: *Deleted

## 2016-03-11 NOTE — Telephone Encounter (Signed)
  Follow up Call-  Call back number 03/10/2016  Post procedure Call Back phone  # [phone]  Permission to leave phone message Yes     Patient questions:  Do you have a fever, pain , or abdominal swelling? No. Pain Score  0 *  Have you tolerated food without any problems? Yes.    Have you been able to return to your normal activities? Yes.    Do you have any questions about your discharge instructions: Diet   No. Medications  No. Follow up visit  No.  Do you have questions or concerns about your Care? No.  Actions: * If pain score is 4 or above: No action needed, pain <4.  Patient c/o "soreness" in abdomen, denies any other symptoms. Encouraged patient to call us back if this worsens. He verbalizes understanding.

## 2016-03-17 ENCOUNTER — Encounter: Payer: Self-pay | Admitting: Gastroenterology

## 2016-04-12 ENCOUNTER — Encounter (HOSPITAL_COMMUNITY): Payer: Self-pay | Admitting: Emergency Medicine

## 2016-04-12 ENCOUNTER — Inpatient Hospital Stay (HOSPITAL_COMMUNITY)
Admission: EM | Admit: 2016-04-12 | Discharge: 2016-04-13 | DRG: 313 | Disposition: A | Discharge status: Home / Self Care | Payer: Medicaid Other | Attending: Internal Medicine | Admitting: Internal Medicine

## 2016-04-12 ENCOUNTER — Other Ambulatory Visit: Payer: Self-pay

## 2016-04-12 ENCOUNTER — Emergency Department (HOSPITAL_COMMUNITY): Payer: Medicaid Other

## 2016-04-12 DIAGNOSIS — R079 Chest pain, unspecified: Secondary | ICD-10-CM

## 2016-04-12 DIAGNOSIS — F32A Depression, unspecified: Secondary | ICD-10-CM

## 2016-04-12 DIAGNOSIS — K219 Gastro-esophageal reflux disease without esophagitis: Secondary | ICD-10-CM

## 2016-04-12 DIAGNOSIS — Z86718 Personal history of other venous thrombosis and embolism: Secondary | ICD-10-CM

## 2016-04-12 DIAGNOSIS — M479 Spondylosis, unspecified: Secondary | ICD-10-CM | POA: Diagnosis present

## 2016-04-12 DIAGNOSIS — R0600 Dyspnea, unspecified: Secondary | ICD-10-CM

## 2016-04-12 DIAGNOSIS — J449 Chronic obstructive pulmonary disease, unspecified: Secondary | ICD-10-CM

## 2016-04-12 DIAGNOSIS — F1910 Other psychoactive substance abuse, uncomplicated: Secondary | ICD-10-CM

## 2016-04-12 DIAGNOSIS — I10 Essential (primary) hypertension: Secondary | ICD-10-CM

## 2016-04-12 DIAGNOSIS — R0602 Shortness of breath: Secondary | ICD-10-CM

## 2016-04-12 DIAGNOSIS — I2510 Atherosclerotic heart disease of native coronary artery without angina pectoris: Secondary | ICD-10-CM | POA: Diagnosis present

## 2016-04-12 DIAGNOSIS — I252 Old myocardial infarction: Secondary | ICD-10-CM

## 2016-04-12 DIAGNOSIS — E860 Dehydration: Secondary | ICD-10-CM | POA: Diagnosis present

## 2016-04-12 DIAGNOSIS — I82401 Acute embolism and thrombosis of unspecified deep veins of right lower extremity: Secondary | ICD-10-CM | POA: Diagnosis present

## 2016-04-12 DIAGNOSIS — Z720 Tobacco use: Secondary | ICD-10-CM

## 2016-04-12 DIAGNOSIS — R0789 Other chest pain: Principal | ICD-10-CM | POA: Diagnosis present

## 2016-04-12 DIAGNOSIS — M1712 Unilateral primary osteoarthritis, left knee: Secondary | ICD-10-CM | POA: Diagnosis present

## 2016-04-12 DIAGNOSIS — R0609 Other forms of dyspnea: Secondary | ICD-10-CM | POA: Diagnosis present

## 2016-04-12 DIAGNOSIS — Z79899 Other long term (current) drug therapy: Secondary | ICD-10-CM

## 2016-04-12 DIAGNOSIS — R0603 Acute respiratory distress: Secondary | ICD-10-CM | POA: Diagnosis present

## 2016-04-12 DIAGNOSIS — I959 Hypotension, unspecified: Secondary | ICD-10-CM | POA: Diagnosis present

## 2016-04-12 DIAGNOSIS — F17210 Nicotine dependence, cigarettes, uncomplicated: Secondary | ICD-10-CM | POA: Diagnosis present

## 2016-04-12 DIAGNOSIS — F1010 Alcohol abuse, uncomplicated: Secondary | ICD-10-CM | POA: Diagnosis present

## 2016-04-12 DIAGNOSIS — F329 Major depressive disorder, single episode, unspecified: Secondary | ICD-10-CM

## 2016-04-12 DIAGNOSIS — Z8673 Personal history of transient ischemic attack (TIA), and cerebral infarction without residual deficits: Secondary | ICD-10-CM

## 2016-04-12 HISTORY — DX: Tobacco use: Z72.0

## 2016-04-12 HISTORY — DX: Alcohol abuse, uncomplicated: F10.10

## 2016-04-12 LAB — I-STAT ARTERIAL BLOOD GAS, ED
ACID-BASE DEFICIT: 7 mmol/L — AB (ref 0.0–2.0)
Bicarbonate: 19.3 mEq/L — ABNORMAL LOW (ref 20.0–24.0)
O2 Saturation: 99 %
TCO2: 20 mmol/L (ref 0–100)
pCO2 arterial: 39.1 mmHg (ref 35.0–45.0)
pH, Arterial: 7.301 — ABNORMAL LOW (ref 7.350–7.450)
pO2, Arterial: 148 mmHg — ABNORMAL HIGH (ref 80.0–100.0)

## 2016-04-12 LAB — CBC WITH DIFFERENTIAL/PLATELET
BASOS PCT: 0 %
Basophils Absolute: 0 10*3/uL (ref 0.0–0.1)
Eosinophils Absolute: 0.2 10*3/uL (ref 0.0–0.7)
Eosinophils Relative: 4 %
HEMATOCRIT: 37.9 % — AB (ref 39.0–52.0)
Hemoglobin: 12.3 g/dL — ABNORMAL LOW (ref 13.0–17.0)
LYMPHS PCT: 50 %
Lymphs Abs: 3 10*3/uL (ref 0.7–4.0)
MCH: 30.1 pg (ref 26.0–34.0)
MCHC: 32.5 g/dL (ref 30.0–36.0)
MCV: 92.9 fL (ref 78.0–100.0)
Monocytes Absolute: 0.4 10*3/uL (ref 0.1–1.0)
Monocytes Relative: 6 %
NEUTROS ABS: 2.4 10*3/uL (ref 1.7–7.7)
Neutrophils Relative %: 40 %
Platelets: 214 10*3/uL (ref 150–400)
RBC: 4.08 MIL/uL — ABNORMAL LOW (ref 4.22–5.81)
RDW: 14.6 % (ref 11.5–15.5)
WBC: 5.9 10*3/uL (ref 4.0–10.5)

## 2016-04-12 LAB — I-STAT TROPONIN, ED: TROPONIN I, POC: 0 ng/mL (ref 0.00–0.08)

## 2016-04-12 MED ORDER — LORAZEPAM 2 MG/ML IJ SOLN
1.0000 mg | Freq: Once | INTRAMUSCULAR | Status: AC
  Administered 2016-04-12: 1 mg via INTRAVENOUS
  Filled 2016-04-12: qty 1

## 2016-04-12 MED ORDER — METHYLPREDNISOLONE SODIUM SUCC 125 MG IJ SOLR
125.0000 mg | Freq: Once | INTRAMUSCULAR | Status: AC
  Administered 2016-04-12: 125 mg via INTRAVENOUS
  Filled 2016-04-12: qty 2

## 2016-04-12 MED ORDER — ALBUTEROL SULFATE (2.5 MG/3ML) 0.083% IN NEBU
5.0000 mg | INHALATION_SOLUTION | Freq: Once | RESPIRATORY_TRACT | Status: AC
  Administered 2016-04-12: 5 mg via RESPIRATORY_TRACT
  Filled 2016-04-12: qty 6

## 2016-04-12 MED ORDER — FUROSEMIDE 10 MG/ML IJ SOLN
20.0000 mg | Freq: Once | INTRAMUSCULAR | Status: AC
Start: 2016-04-12 — End: 2016-04-12
  Administered 2016-04-12: 20 mg via INTRAVENOUS
  Filled 2016-04-12: qty 2

## 2016-04-12 NOTE — ED Provider Notes (Signed)
CSN: [id number]     Arrival date & time 04/12/16  2242 History   By signing my name below, I, Maud Deed. Royston Sinner, attest that this documentation has been prepared under the direction and in the presence of [name], MD.  Electronically Signed: Maud Deed. Royston Sinner, ED Scribe. 04/12/2016. 12:35 AM.   Chief Complaint  Patient presents with  . Chest Pain  . Shortness of Breath   Patient is a 60 y.o. male presenting with chest pain. The history is provided by the patient and a relative. No language interpreter was used.  Chest Pain Pain location:  Unable to specify Pain radiates to:  Does not radiate Pain radiates to the back: no   Pain severity:  Moderate Onset quality:  Sudden Duration:  4 hours Timing:  Constant Progression:  Resolved Chronicity:  Recurrent Relieved by:  Nitroglycerin Worsened by:  Nothing tried Associated symptoms: numbness and shortness of breath   Associated symptoms: no cough, no fever and not vomiting   Risk factors: hypertension     HPI Comments: Brent Byrd brought in by EMS is a 60 y.o. male with a PMHx of substance abuse, COPD, DVT, GERD, MI, stroke, and HTN who presents to the Emergency Department complaining of sudden onset, worsening chest pain with associated shortness of breath x 2-3 hours prior to arrival. One episode of L arm numbness reported to EMS upon arrival. Nitro attempted PTA with improvement for chest pain, however, shortness of breath still persists. 324 of ASA and 250 mL of NS given en route to department. No other interventions provided. No recent fever chills, cough, or vomiting. Pt admits he has been off of his prescribed medications for a number of years.  PCP: Philis Fendt, MD    Past Medical History  Diagnosis Date  . Substance abuse     Alcohol dependence Hx  . Chest pain     hx  . COPD (chronic obstructive pulmonary disease) ([place])   . Gastroesophageal reflux disease 2014  . MI, old   . DVT (deep venous thrombosis) ([place]) 2008     Right leg- txed with coumadin   . Arthritis     Left knee, lower back  . Hypertension      per patiente-controlled, was taken off BP med  . Depression     no meds   Past Surgical History  Procedure Laterality Date  . Spine surgery      S/P Herniated disc  . Wisdom tooth extraction    . Wrist surgery Left    Family History  Problem Relation Age of Onset  . Asthma Mother   . Cancer Father     Lung  . Cancer Sister   . Thrombosis Sister   . Diabetes Sister   . Colon cancer Neg Hx   . Rectal cancer Neg Hx   . Stomach cancer Neg Hx   . Esophageal cancer Neg Hx    Social History  Substance Use Topics  . Smoking status: Current Every Day Smoker -- 1.00 packs/day for 44 years    Types: Cigarettes  . Smokeless tobacco: Never Used  . Alcohol Use: 4.2 oz/week    7 Cans of beer per week     Comment: hx of heavy drinking    Review of Systems  Constitutional: Negative for fever and chills.  Respiratory: Positive for shortness of breath. Negative for cough.   Cardiovascular: Positive for chest pain.  Gastrointestinal: Negative for vomiting.  Neurological: Positive for numbness.  Psychiatric/Behavioral: Negative for confusion.  All other systems reviewed and are negative.     Allergies  Iodine and Omnipaque  Home Medications   Prior to Admission medications   Medication Sig Start Date End Date Taking? Authorizing Provider  ibuprofen (ADVIL,MOTRIN) 800 MG tablet Take 1 tablet (800 mg total) by mouth 3 (three) times daily. Patient not taking: Reported on 02/25/2016 09/30/15   Courteney Lyn Mackuen, MD  naproxen (NAPROSYN) 500 MG tablet Take 500 mg by mouth 2 (two) times daily with a meal. As needed for back pain    Historical Provider, MD  nitroGLYCERIN (NITROSTAT) 0.4 MG SL tablet Place 0.4 mg under the tongue every 5 (five) minutes as needed for chest pain. Reported on 02/25/2016    Historical Provider, MD  omeprazole (PRILOSEC) 20 MG capsule Take 20 mg by mouth daily.     Historical Provider, MD  traZODone (DESYREL) 100 MG tablet Take 100 mg by mouth at bedtime as needed for sleep.    Historical Provider, MD   Triage Vitals: BP 121/88 mmHg  Pulse 75  Temp(Src) 98.2 F (36.8 C) (Oral)  Resp 12  Ht 6\' 4"  (1.93 m)  Wt 260 lb (117.935 kg)  BMI 31.66 kg/m2  SpO2 96%   Physical Exam  Constitutional: He is oriented to person, place, and time. He appears well-developed and well-nourished.  Appears anxious   HENT:  Head: Normocephalic and atraumatic.  Eyes: EOM are normal.  Neck: Normal range of motion.  Cardiovascular: Normal rate, regular rhythm, normal heart sounds and intact distal pulses.   No murmur heard. Pulmonary/Chest: Breath sounds normal. He is in respiratory distress. He has no wheezes. He has no rales.  Patient is tachypnic and appears to be in moderate respiratory distress.  Abdominal: Soft. He exhibits no distension. There is no tenderness.  Musculoskeletal: Normal range of motion. He exhibits no edema or tenderness.  Neurological: He is alert and oriented to person, place, and time.  Skin: Skin is warm and dry.  Psychiatric: He has a normal mood and affect. Judgment normal.  Nursing note and vitals reviewed.   ED Course  Procedures (including critical care time)  DIAGNOSTIC STUDIES: Oxygen Saturation is 96% on 2 Liters of Oxygen, adequate by my interpretation.    COORDINATION OF CARE: 11:17 PM- Will order CXR, blood work, or EKG. Will give Solu-medrol, Ativan, and breathing treatment. Discussed treatment plan with pt at bedside and pt agreed to plan.     Labs Review Labs Reviewed  COMPREHENSIVE METABOLIC PANEL  BRAIN NATRIURETIC PEPTIDE  CBC WITH DIFFERENTIAL/PLATELET  D-DIMER, QUANTITATIVE (NOT AT [facility])  BLOOD GAS, ARTERIAL  ETHANOL  I-STAT TROPOININ, ED    Imaging Review Dg Chest Port 1 View  04/13/2016  CLINICAL DATA:  60 year old male with shortness of breath EXAM: PORTABLE CHEST 1 VIEW COMPARISON:  Chest radiograph  dated 05/17/2015 FINDINGS: Single pleural view of the chest demonstrates mild moderate cardiomegaly with central vascular prominence compatible with congestive changes. Left lung base atelectasis versus infiltrate as well as a small left pleural effusion noted. There is no pneumothorax. No acute osseous pathology. IMPRESSION: Cardiomegaly with congestive changes. Small left pleural effusion and left lung base atelectasis versus infiltrate. Clinical correlation is recommended. Electronically Signed   By: Anner Crete M.D.   On: 04/13/2016 00:32   I have personally reviewed and evaluated these images and lab results as part of my medical decision-making.   EKG Interpretation   Date/Time:  Sunday April 12 2016 23:02:06 EDT Ventricular  Rate:  72 PR Interval:    QRS Duration: 105 QT Interval:  406 QTC Calculation: 445 R Axis:     Text Interpretation:  Sinus rhythm LVH with secondary repolarization  abnormality Anterior Q waves, possibly due to LVH Confirmed by Shawni Volkov  MD,  Ezekiel Menzer ([id number]) on 04/12/2016 11:09:58 PM      MDM   Final diagnoses:  None    Patient is a 60 year old male with past medical history of COPD, DVT, and coronary artery disease. He presents for evaluation of difficulty breathing over the past several days and has worsened tonight. He denies any fever, chills, or cough. He is not endorsing any chest pain. When the patient was initially evaluated, he appeared somewhat anxious. He was initially hypotensive, however not tachycardic. His oxygen saturations on 2 L nasal cannula initially were in the upper 90s. Due to his reported dyspnea. He was placed on a nonrebreather which did seem to help somewhat. He also received Ativan which made him less anxious.  His workup reveals cardiomegaly with congestive changes on his x-ray. I did give him a low dose of Lasix. He also received nebulizer treatments and steroids. The remainder the workup reveals no acute abnormality, however his  d-dimer was elevated at 1.6. It was my intention to order a CT angio of the chest, however the patient is allergic to iodine and CT scan dye, making this test impossible to perform. He will be treated presumptively for pulmonary embolism with heparin and admitted to the hospitalist service under the care of Dr. Blaine Hamper.   I personally performed the services described in this documentation, which was scribed in my presence. The recorded information has been reviewed and is accurate.       [name], MD 04/13/16 [id number]

## 2016-04-12 NOTE — ED Notes (Signed)
Pt sleeping. Respirations even, regular and unlabored. O2 sats 98% RA

## 2016-04-12 NOTE — ED Notes (Signed)
Pt arrives by [facility] with c/o chest pain and SOB. Pt took nitro PTA which relieved pain, but pt is still SOB. EMS gave 324 aspirin. Last palpated BP was 88 systolic, gave 0000000 NS. Pt stated he had numbness in his left arm with the chest pain, passed EMS stroke screen. Initial BP 112/88. O2 sats 92% on RA, 94% on 2L, 96% on 4L. Denies dizziness, n/v.

## 2016-04-13 ENCOUNTER — Inpatient Hospital Stay (HOSPITAL_COMMUNITY): Payer: Medicaid Other

## 2016-04-13 ENCOUNTER — Encounter (HOSPITAL_COMMUNITY): Payer: Self-pay | Admitting: Internal Medicine

## 2016-04-13 ENCOUNTER — Other Ambulatory Visit (HOSPITAL_COMMUNITY): Payer: Medicaid Other

## 2016-04-13 DIAGNOSIS — J449 Chronic obstructive pulmonary disease, unspecified: Secondary | ICD-10-CM | POA: Diagnosis present

## 2016-04-13 DIAGNOSIS — R0600 Dyspnea, unspecified: Secondary | ICD-10-CM

## 2016-04-13 DIAGNOSIS — I959 Hypotension, unspecified: Secondary | ICD-10-CM

## 2016-04-13 DIAGNOSIS — R0602 Shortness of breath: Secondary | ICD-10-CM | POA: Diagnosis not present

## 2016-04-13 DIAGNOSIS — I82401 Acute embolism and thrombosis of unspecified deep veins of right lower extremity: Secondary | ICD-10-CM | POA: Diagnosis present

## 2016-04-13 DIAGNOSIS — K219 Gastro-esophageal reflux disease without esophagitis: Secondary | ICD-10-CM | POA: Insufficient documentation

## 2016-04-13 DIAGNOSIS — F329 Major depressive disorder, single episode, unspecified: Secondary | ICD-10-CM | POA: Insufficient documentation

## 2016-04-13 DIAGNOSIS — R079 Chest pain, unspecified: Secondary | ICD-10-CM

## 2016-04-13 DIAGNOSIS — F1010 Alcohol abuse, uncomplicated: Secondary | ICD-10-CM | POA: Diagnosis present

## 2016-04-13 DIAGNOSIS — F1910 Other psychoactive substance abuse, uncomplicated: Secondary | ICD-10-CM | POA: Diagnosis present

## 2016-04-13 DIAGNOSIS — Z720 Tobacco use: Secondary | ICD-10-CM | POA: Diagnosis not present

## 2016-04-13 DIAGNOSIS — M1712 Unilateral primary osteoarthritis, left knee: Secondary | ICD-10-CM | POA: Diagnosis present

## 2016-04-13 DIAGNOSIS — Z8673 Personal history of transient ischemic attack (TIA), and cerebral infarction without residual deficits: Secondary | ICD-10-CM | POA: Diagnosis not present

## 2016-04-13 DIAGNOSIS — I252 Old myocardial infarction: Secondary | ICD-10-CM | POA: Diagnosis not present

## 2016-04-13 DIAGNOSIS — R0603 Acute respiratory distress: Secondary | ICD-10-CM | POA: Diagnosis present

## 2016-04-13 DIAGNOSIS — R0609 Other forms of dyspnea: Secondary | ICD-10-CM | POA: Diagnosis present

## 2016-04-13 DIAGNOSIS — F17210 Nicotine dependence, cigarettes, uncomplicated: Secondary | ICD-10-CM | POA: Diagnosis present

## 2016-04-13 DIAGNOSIS — I10 Essential (primary) hypertension: Secondary | ICD-10-CM | POA: Insufficient documentation

## 2016-04-13 DIAGNOSIS — I2510 Atherosclerotic heart disease of native coronary artery without angina pectoris: Secondary | ICD-10-CM | POA: Diagnosis present

## 2016-04-13 DIAGNOSIS — M479 Spondylosis, unspecified: Secondary | ICD-10-CM | POA: Diagnosis present

## 2016-04-13 DIAGNOSIS — F32A Depression, unspecified: Secondary | ICD-10-CM | POA: Insufficient documentation

## 2016-04-13 DIAGNOSIS — E860 Dehydration: Secondary | ICD-10-CM | POA: Diagnosis present

## 2016-04-13 DIAGNOSIS — Z86718 Personal history of other venous thrombosis and embolism: Secondary | ICD-10-CM | POA: Diagnosis not present

## 2016-04-13 DIAGNOSIS — R0789 Other chest pain: Secondary | ICD-10-CM | POA: Diagnosis present

## 2016-04-13 DIAGNOSIS — Z79899 Other long term (current) drug therapy: Secondary | ICD-10-CM | POA: Diagnosis not present

## 2016-04-13 LAB — ECHOCARDIOGRAM COMPLETE
CHL CUP MV DEC (S): 169
E decel time: 169 msec
E/e' ratio: 7.14
FS: 31 % (ref 28–44)
HEIGHTINCHES: 76 in
IV/PV OW: 1.23
LA vol A4C: 83.3 ml
LA vol index: 31.8 mL/m2
LA vol: 76.6 mL
LADIAMINDEX: 1.7 cm/m2
LASIZE: 41 mm
LEFT ATRIUM END SYS DIAM: 41 mm
LV E/e' medial: 7.14
LV e' LATERAL: 9.03 cm/s
LVEEAVG: 7.14
LVOT area: 5.31 cm2
LVOT diameter: 26 mm
MV pk E vel: 64.5 m/s
MVPKAVEL: 121 m/s
PW: 9.68 mm — AB (ref 0.6–1.1)
TDI e' lateral: 9.03
TDI e' medial: 8.49
Weight: 3910.4 oz

## 2016-04-13 LAB — COMPREHENSIVE METABOLIC PANEL
ALK PHOS: 59 U/L (ref 38–126)
ALK PHOS: 61 U/L (ref 38–126)
ALT: 16 U/L — AB (ref 17–63)
ALT: 15 U/L — AB (ref 17–63)
ANION GAP: 8 (ref 5–15)
AST: 24 U/L (ref 15–41)
AST: 25 U/L (ref 15–41)
Albumin: 3.4 g/dL — ABNORMAL LOW (ref 3.5–5.0)
Albumin: 3.6 g/dL (ref 3.5–5.0)
Anion gap: 8 (ref 5–15)
BILIRUBIN TOTAL: 0.3 mg/dL (ref 0.3–1.2)
BUN: 10 mg/dL (ref 6–20)
BUN: 8 mg/dL (ref 6–20)
CALCIUM: 8.5 mg/dL — AB (ref 8.9–10.3)
CHLORIDE: 111 mmol/L (ref 101–111)
CO2: 18 mmol/L — AB (ref 22–32)
CO2: 19 mmol/L — AB (ref 22–32)
CREATININE: 0.89 mg/dL (ref 0.61–1.24)
CREATININE: 0.93 mg/dL (ref 0.61–1.24)
Calcium: 8.3 mg/dL — ABNORMAL LOW (ref 8.9–10.3)
Chloride: 108 mmol/L (ref 101–111)
GFR calc Af Amer: >60 mL/min (ref >60)
Glucose, Bld: 121 mg/dL — ABNORMAL HIGH (ref 65–99)
Glucose, Bld: 94 mg/dL (ref 65–99)
Potassium: 3.5 mmol/L (ref 3.5–5.1)
Potassium: 3.9 mmol/L (ref 3.5–5.1)
SODIUM: 137 mmol/L (ref 135–145)
SODIUM: 135 mmol/L (ref 135–145)
TOTAL PROTEIN: 6.6 g/dL (ref 6.5–8.1)
Total Bilirubin: 0.2 mg/dL — ABNORMAL LOW (ref 0.3–1.2)
Total Protein: 6.7 g/dL (ref 6.5–8.1)

## 2016-04-13 LAB — D-DIMER, QUANTITATIVE (NOT AT ARMC): D DIMER QUANT: 1.6 ug{FEU}/mL — AB (ref 0.00–0.50)

## 2016-04-13 LAB — TROPONIN I
Troponin I: <0.03 ng/mL (ref <0.031)
Troponin I: <0.03 ng/mL (ref <0.031)

## 2016-04-13 LAB — PROTIME-INR
INR: 1.18 (ref 0.00–1.49)
PROTHROMBIN TIME: 15.2 s (ref 11.6–15.2)

## 2016-04-13 LAB — MAGNESIUM: Magnesium: 1.7 mg/dL (ref 1.7–2.4)

## 2016-04-13 LAB — RAPID URINE DRUG SCREEN, HOSP PERFORMED
Amphetamines: NOT DETECTED
Barbiturates: NOT DETECTED
Benzodiazepines: NOT DETECTED
Cocaine: NOT DETECTED
OPIATES: NOT DETECTED
Tetrahydrocannabinol: NOT DETECTED

## 2016-04-13 LAB — BRAIN NATRIURETIC PEPTIDE: B Natriuretic Peptide: 77 pg/mL (ref 0.0–100.0)

## 2016-04-13 LAB — LIPID PANEL
CHOL/HDL RATIO: 2.5 ratio
CHOLESTEROL: 187 mg/dL (ref 0–200)
HDL: 76 mg/dL (ref >40)
LDL CALC: 101 mg/dL — AB (ref 0–99)
TRIGLYCERIDES: 48 mg/dL (ref <150)
VLDL: 10 mg/dL (ref 0–40)

## 2016-04-13 LAB — HEPARIN LEVEL (UNFRACTIONATED)
HEPARIN UNFRACTIONATED: 0.66 [IU]/mL (ref 0.30–0.70)
Heparin Unfractionated: 0.46 IU/mL (ref 0.30–0.70)

## 2016-04-13 LAB — MRSA PCR SCREENING: MRSA by PCR: NEGATIVE

## 2016-04-13 LAB — APTT: aPTT: >200 seconds (ref 24–37)

## 2016-04-13 LAB — ETHANOL: Alcohol, Ethyl (B): 245 mg/dL — ABNORMAL HIGH (ref <5)

## 2016-04-13 MED ORDER — FOLIC ACID 1 MG PO TABS: 1.0000 mg | ORAL_TABLET | Freq: Every day | ORAL | Status: DC

## 2016-04-13 MED ORDER — NICOTINE 21 MG/24HR TD PT24
21.0000 mg | MEDICATED_PATCH | Freq: Every day | TRANSDERMAL | Status: DC
Start: 2016-04-13 — End: 2016-04-13
  Administered 2016-04-13: 21 mg via TRANSDERMAL
  Filled 2016-04-13: qty 1

## 2016-04-13 MED ORDER — CHLORDIAZEPOXIDE HCL 10 MG PO CAPS: 10.0000 mg | ORAL_CAPSULE | Freq: Three times a day (TID) | ORAL | Status: DC

## 2016-04-13 MED ORDER — VITAMIN B-1 100 MG PO TABS
100.0000 mg | ORAL_TABLET | Freq: Every day | ORAL | Status: DC
  Administered 2016-04-13: 100 mg via ORAL
  Filled 2016-04-13: qty 1

## 2016-04-13 MED ORDER — APIXABAN 5 MG PO TABS
10.0000 mg | ORAL_TABLET | Freq: Two times a day (BID) | ORAL | Status: DC
  Administered 2016-04-13: 10 mg via ORAL
  Filled 2016-04-13 (×2): qty 2

## 2016-04-13 MED ORDER — LORAZEPAM 2 MG/ML IJ SOLN: 1.0000 mg | Freq: Four times a day (QID) | INTRAMUSCULAR | Status: DC | PRN

## 2016-04-13 MED ORDER — TECHNETIUM TC 99M DIETHYLENETRIAME-PENTAACETIC ACID: 30.7000 | Freq: Once | INTRAVENOUS | Status: DC | PRN

## 2016-04-13 MED ORDER — HEPARIN SODIUM (PORCINE) 5000 UNIT/ML IJ SOLN: 5000.0000 [IU] | Freq: Three times a day (TID) | INTRAMUSCULAR | Status: DC

## 2016-04-13 MED ORDER — ONDANSETRON HCL 4 MG/2ML IJ SOLN: 4.0000 mg | Freq: Three times a day (TID) | INTRAMUSCULAR | Status: DC | PRN

## 2016-04-13 MED ORDER — POTASSIUM CHLORIDE IN NACL 40-0.9 MEQ/L-% IV SOLN
INTRAVENOUS | Status: DC
  Administered 2016-04-13: 75 mL/h via INTRAVENOUS
  Filled 2016-04-13: qty 1000

## 2016-04-13 MED ORDER — APIXABAN 5 MG PO TABS: 5.0000 mg | ORAL_TABLET | Freq: Two times a day (BID) | ORAL | Status: DC

## 2016-04-13 MED ORDER — LORAZEPAM 2 MG/ML IJ SOLN: 0.0000 mg | Freq: Two times a day (BID) | INTRAMUSCULAR | Status: DC

## 2016-04-13 MED ORDER — HEPARIN (PORCINE) IN NACL 100-0.45 UNIT/ML-% IJ SOLN
1800.0000 [IU]/h | INTRAMUSCULAR | Status: DC
  Administered 2016-04-13: 1800 [IU]/h via INTRAVENOUS
  Filled 2016-04-13 (×2): qty 250

## 2016-04-13 MED ORDER — NITROGLYCERIN 0.4 MG SL SUBL: 0.4000 mg | SUBLINGUAL_TABLET | SUBLINGUAL | Status: DC | PRN

## 2016-04-13 MED ORDER — FOLIC ACID 1 MG PO TABS
1.0000 mg | ORAL_TABLET | Freq: Every day | ORAL | Status: DC
  Administered 2016-04-13: 1 mg via ORAL
  Filled 2016-04-13: qty 1

## 2016-04-13 MED ORDER — MORPHINE SULFATE (PF) 2 MG/ML IV SOLN: 2.0000 mg | INTRAVENOUS | Status: DC | PRN

## 2016-04-13 MED ORDER — VITAMIN B-1 100 MG PO TABS: 100.0000 mg | ORAL_TABLET | Freq: Every day | ORAL | Status: DC

## 2016-04-13 MED ORDER — THIAMINE HCL 100 MG/ML IJ SOLN: 100.0000 mg | Freq: Every day | INTRAMUSCULAR | Status: DC

## 2016-04-13 MED ORDER — HEPARIN BOLUS VIA INFUSION
6000.0000 [IU] | Freq: Once | INTRAVENOUS | Status: AC
  Administered 2016-04-13: 6000 [IU] via INTRAVENOUS
  Filled 2016-04-13: qty 6000

## 2016-04-13 MED ORDER — LORAZEPAM 1 MG PO TABS: 1.0000 mg | ORAL_TABLET | Freq: Four times a day (QID) | ORAL | Status: DC | PRN

## 2016-04-13 MED ORDER — APIXABAN 5 MG PO TABS: 10.0000 mg | ORAL_TABLET | Freq: Two times a day (BID) | ORAL | Status: DC

## 2016-04-13 MED ORDER — FAMOTIDINE IN NACL 20-0.9 MG/50ML-% IV SOLN
20.0000 mg | Freq: Two times a day (BID) | INTRAVENOUS | Status: DC
  Administered 2016-04-13: 20 mg via INTRAVENOUS
  Filled 2016-04-13: qty 50

## 2016-04-13 MED ORDER — TECHNETIUM TO 99M ALBUMIN AGGREGATED
4.1000 | Freq: Once | INTRAVENOUS | Status: AC | PRN
  Administered 2016-04-13: 4 via INTRAVENOUS

## 2016-04-13 MED ORDER — ALBUTEROL SULFATE (2.5 MG/3ML) 0.083% IN NEBU: 5.0000 mg | INHALATION_SOLUTION | RESPIRATORY_TRACT | Status: DC | PRN

## 2016-04-13 MED ORDER — NAPROXEN 250 MG PO TABS
500.0000 mg | ORAL_TABLET | Freq: Two times a day (BID) | ORAL | Status: DC
  Administered 2016-04-13 (×2): 500 mg via ORAL
  Filled 2016-04-13 (×2): qty 2

## 2016-04-13 MED ORDER — LORAZEPAM 2 MG/ML IJ SOLN: 0.0000 mg | Freq: Four times a day (QID) | INTRAMUSCULAR | Status: DC

## 2016-04-13 MED ORDER — SODIUM CHLORIDE 0.9 % IV SOLN
INTRAVENOUS | Status: DC
  Administered 2016-04-13: 02:00:00 via INTRAVENOUS

## 2016-04-13 MED ORDER — ASPIRIN 300 MG RE SUPP
300.0000 mg | Freq: Every day | RECTAL | Status: DC
  Filled 2016-04-13: qty 1

## 2016-04-13 NOTE — Progress Notes (Signed)
ANTICOAGULATION CONSULT NOTE - Initial Consult  Pharmacy Consult for Heparin Indication: presumed PE  Allergies  Allergen Reactions  . Iodine Hives and Other (See Comments)  . Omnipaque [Iohexol] Other (See Comments)    IV Dye-Sneezing    Patient Measurements: Height: 6\' 4"  (193 cm) Weight: 260 lb (117.935 kg) IBW/kg (Calculated) : 86.8 Heparin Dosing Weight: 112 kg  Vital Signs: Temp: 98.2 F (36.8 C) (06/18 2249) Temp Source: Oral (06/18 2249) BP: 121/88 mmHg (06/18 2249) Pulse Rate: 75 (06/18 2249)  Labs:  Recent Labs  04/12/16 2342  HGB 12.3*  HCT 37.9*  PLT 214  CREATININE 0.93    Estimated Creatinine Clearance: 120 mL/min (by C-G formula based on Cr of 0.93).   Medical History: Past Medical History  Diagnosis Date  . Substance abuse     Alcohol dependence Hx  . Chest pain     hx  . COPD (chronic obstructive pulmonary disease) ([place])   . Gastroesophageal reflux disease 2014  . MI, old   . DVT (deep venous thrombosis) ([place]) 2008    Right leg- txed with coumadin   . Arthritis     Left knee, lower back  . Hypertension      per patiente-controlled, was taken off BP med  . Depression     no meds  . Tobacco abuse     Medications:  See electronic med rec  Assessment: 60 y.o. M presents with CP and SOB. To begin heparin for presumed PE. CBC ok on admission. No AC PTA.  Goal of Therapy:  Heparin level 0.3-0.7 units/ml Monitor platelets by anticoagulation protocol: Yes   Plan:  Heparin IV bolus 6000 units Heparin gtt at 1800 units/hr Will f/u heparin level in 6 hours Daily heparin level and CBC  Sherlon Handing, PharmD, BCPS Clinical pharmacist, pager [phone] 04/13/2016,1:01 AM

## 2016-04-13 NOTE — Discharge Summary (Addendum)
Brent Byrd, is a 60 y.o. male  DOB 10-16-1956  MRN [id number].  Admission date:  04/12/2016  Admitting Physician  Ivor Costa, MD  Discharge Date:  04/13/2016   Primary MD  Philis Fendt, MD  Recommendations for primary care physician for things to follow:   Monitor secondaries factors for CAD. Monitor alcohol use.  Needs Eliquis lifelong this is his second DVT.   Admission Diagnosis  Substance abuse [F19.10] Tobacco abuse [Z72.0] Depression [F32.9] Dyspnea [R06.00] Gastroesophageal reflux disease without esophagitis [K21.9] Essential hypertension [I10] Chest pain [R07.9] Chest pain, unspecified chest pain type [R07.9] Chronic obstructive pulmonary disease, unspecified COPD type ([place]) [J44.9]   Discharge Diagnosis  Substance abuse [F19.10] Tobacco abuse [Z72.0] Depression [F32.9] Dyspnea [R06.00] Gastroesophageal reflux disease without esophagitis [K21.9] Essential hypertension [I10] Chest pain [R07.9] Chest pain, unspecified chest pain type [R07.9] Chronic obstructive pulmonary disease, unspecified COPD type ([place]) [J44.9]     Principal Problem:   Acute respiratory distress ([place]) Active Problems:   Cigarette smoker   Chest pain   SOB (shortness of breath)   Hypotension   Alcohol abuse      Past Medical History  Diagnosis Date  . Substance abuse     Alcohol dependence Hx  . Chest pain     hx  . COPD (chronic obstructive pulmonary disease) ([place])   . Gastroesophageal reflux disease 2014  . MI, old   . DVT (deep venous thrombosis) ([place]) 2008    Right leg- txed with coumadin   . Arthritis     Left knee, lower back  . Hypertension      per patiente-controlled, was taken off BP med  . Depression     no meds  . Tobacco abuse   . Alcohol abuse     Past Surgical History  Procedure  Laterality Date  . Spine surgery      S/P Herniated disc  . Wisdom tooth extraction    . Wrist surgery Left        HPI  from the history and physical done on the day of admission:    Brent Byrd is a 60 y.o. male with medical history significant of hypertension, COPD, GERD, depression, tobacco abuse, alcohol abuse, drug abuse, DVT, CAD, myocardial infarction, stroke, who presents with chest pain and shortness of breath.  When I saw pt in ED, he just received one dose of ativan. He is very drowsy, difficult to get history in detail. The history is limited. Per his girlfriend, patient started having chest pain and shortness of breath in this morning. His chest pain is severe, located in the frontal chest, constant. It is associated with shortness of breath, patient speaks full sentences. He has mild dry cough. His girlfriend states that the patient traveled to and back from [place] by driving 2 weeks ago. He did not complain of tenderness over calf areas. He looks very uncomfortable in bed per EDP. EMS, patient had one episode of hypotension with systolic blood pressure 88, was given 250 cc NS by  EMS, bp improved to 98/75 now.  Per his girlfriend, patient does not seem to have nausea, vomiting, diarrhea, abdominal pain. He did not complaints of symptoms of UTI. He moves all extremities. No fever or chills.     Hospital Course:   Atypical chest pain. No respiratory failure during my evaluation. Ruled out for MI, seen by cardiologist, no further inpatient workup, his d-dimer was borderline hence he underwent VQ scan which was unremarkable, lower extremity venous duplex However does show DVT, question if he has small PE, currently not hypoxic and relatively symptom-free, he does have history of alcohol abuse, smoking and cocaine abuse, did not provide urine sample for drug testing, alcohol level was extremely hyper admission. His initial presentation could have been due to alcohol intoxication,  currently symptom-free, vital signs stable, he is 99% pulse ox on room air. Her cardiology no further inpatient workup. We'll request him to follow with cardiology outpatient for a possible outpatient stress test. He is currently stable and will be discharged home on home regimen and follow-up with PCP.  Hypotension: Due to dehydration has resolved after IV fluids.  COPD: No wheezing, no acute issues at baseline.  Tobacco abuse and Alcohol abuse: Consulted to quit both, no signs of DTs, he says he drinks over the weekends only. Also past history of cocaine abuse. He currently denies using any recreational drugs. Did not provide urine sample for drug screening.  Hx of stroke: No acute issues continue home regimen.  Acute Right leg DVT. Second episode, he was supposed to be on xaralto lifelong but he stopped it himself thinking that medication was dropping his blood pressure. He has been counseled, have placed him on Eliquis. PCP to monitor. Will be provided 30 day supply from the hospital.   Follow UP  Follow-up Information    Follow up with Philis Fendt, MD. Schedule an appointment as soon as possible for a visit in 1 week.   Specialty:  Internal Medicine   Contact information:   [address] [place] [address] [phone]       Follow up with Peter Martinique, MD. Schedule an appointment as soon as possible for a visit in 1 week.   Specialty:  Cardiology   Contact information:   [address] [place] [address] [phone]        Consults obtained - Cards  Discharge Condition: Stable  Diet and Activity recommendation: See Discharge Instructions below  Discharge Instructions           Discharge Instructions    Discharge instructions    Complete by:  As directed   Follow with Primary MD Philis Fendt, MD in 7 days   Get CBC, CMP, 2 view Chest X ray checked  by Primary MD next visit.    Activity: As tolerated with Full fall precautions use  walker/cane & assistance as needed   Disposition Home     Diet:   Heart Healthy , Low Carb  For Heart failure patients - Check your Weight same time everyday, if you gain over 2 pounds, or you develop in leg swelling, experience more shortness of breath or chest pain, call your Primary MD immediately. Follow Cardiac Low Salt Diet and 1.5 lit/day fluid restriction.   On your next visit with your primary care physician please Get Medicines reviewed and adjusted.   Please request your Prim.MD to go over all Hospital Tests and Procedure/Radiological results at the follow up, please get all Hospital records  sent to your Prim MD by signing hospital release before you go home.   If you experience worsening of your admission symptoms, develop shortness of breath, life threatening emergency, suicidal or homicidal thoughts you must seek medical attention immediately by calling 911 or calling your MD immediately  if symptoms less severe.  You Must read complete instructions/literature along with all the possible adverse reactions/side effects for all the Medicines you take and that have been prescribed to you. Take any new Medicines after you have completely understood and accpet all the possible adverse reactions/side effects.   Do not drive, operate heavy machinery, perform activities at heights, swimming or participation in water activities or provide baby sitting services if your were admitted for syncope or siezures until you have seen by Primary MD or a Neurologist and advised to do so again.  Do not drive when taking Pain medications.    Do not take more than prescribed Pain, Sleep and Anxiety Medications  Special Instructions: If you have smoked or chewed Tobacco  in the last 2 yrs please stop smoking, stop any regular Alcohol  and or any Recreational drug use.  Wear Seat belts while driving.   Please note  You were cared for by a hospitalist during your hospital stay. If you have any  questions about your discharge medications or the care you received while you were in the hospital after you are discharged, you can call the unit and asked to speak with the hospitalist on call if the hospitalist that took care of you is not available. Once you are discharged, your primary care physician will handle any further medical issues. Please note that NO REFILLS for any discharge medications will be authorized once you are discharged, as it is imperative that you return to your primary care physician (or establish a relationship with a primary care physician if you do not have one) for your aftercare needs so that they can reassess your need for medications and monitor your lab values.     Increase activity slowly    Complete by:  As directed              Discharge Medications       Medication List    STOP taking these medications        ibuprofen 800 MG tablet  Commonly known as:  ADVIL,MOTRIN      TAKE these medications        apixaban 5 MG Tabs tablet  Commonly known as:  ELIQUIS  Take 2 tablets (10 mg total) by mouth 2 (two) times daily.     apixaban 5 MG Tabs tablet  Commonly known as:  ELIQUIS  Take 1 tablet (5 mg total) by mouth 2 (two) times daily.  Start taking on:  [id number]     folic acid 1 MG tablet  Commonly known as:  FOLVITE  Take 1 tablet (1 mg total) by mouth daily.     naproxen 500 MG tablet  Commonly known as:  NAPROSYN  Take 500 mg by mouth 2 (two) times daily with a meal. As needed for back pain     nitroGLYCERIN 0.4 MG SL tablet  Commonly known as:  NITROSTAT  Place 0.4 mg under the tongue every 5 (five) minutes as needed for chest pain. Reported on 02/25/2016     thiamine 100 MG tablet  Commonly known as:  VITAMIN B-1  Take 1 tablet (100 mg total) by mouth daily.  Major procedures and Radiology Reports - PLEASE review detailed and final reports for all details, in brief -   Lower extremity venous duplex - Findings consistent  with acute versus subacute deep vein or superficial thrombosis involving the right small saphenous vein at the junction.  Left: No evidence of DVT, superficial thrombosis. Bilaterally : no Baker's cyst.   TTE  Left ventricle: The cavity size was severely dilated. There was mild concentric hypertrophy. Systolic function was normal. The estimated ejection fraction was in the range of 60% to 65%. Wall motion was normal; there were no regional wall motion abnormalities. Doppler parameters are consistent with abnormal left ventricular relaxation (grade 1 diastolic dysfunction). - Aortic valve: There was mild regurgitation. Regurgitation pressure half-time: 514 ms. - Mitral valve: Transvalvular velocity was within the normal range. There was no evidence for stenosis. There was no regurgitation. - Left atrium: The atrium was moderately dilated. - Right ventricle: The cavity size was normal. Wall thickness was normal. Systolic function was normal. - Tricuspid valve: There was no regurgitation.   Nm Pulmonary Perf And Vent  04/13/2016  CLINICAL DATA:  Chest pain EXAM: NUCLEAR MEDICINE VENTILATION - PERFUSION LUNG SCAN TECHNIQUE: Ventilation images were obtained in multiple projections using inhaled aerosol Tc-2m DTPA. Perfusion images were obtained in multiple projections after intravenous injection of Tc-21m MAA. RADIOPHARMACEUTICALS:  30.7 mCi Technetium-71m DTPA aerosol inhalation and 4.1 mCi Technetium-52m MAA IV COMPARISON:  Chest x-ray 04/12/2016 FINDINGS: Ventilation: No focal ventilation defect. Perfusion: No wedge shaped peripheral perfusion defects to suggest acute pulmonary embolism. IMPRESSION: Normal ventilation and perfusion scan. Electronically Signed   By: Lahoma Crocker M.D.   On: 04/13/2016 10:44   Dg Chest Port 1 View  04/13/2016  CLINICAL DATA:  60 year old male with shortness of breath EXAM: PORTABLE CHEST 1 VIEW COMPARISON:  Chest radiograph dated 05/17/2015 FINDINGS: Single pleural  view of the chest demonstrates mild moderate cardiomegaly with central vascular prominence compatible with congestive changes. Left lung base atelectasis versus infiltrate as well as a small left pleural effusion noted. There is no pneumothorax. No acute osseous pathology. IMPRESSION: Cardiomegaly with congestive changes. Small left pleural effusion and left lung base atelectasis versus infiltrate. Clinical correlation is recommended. Electronically Signed   By: Anner Crete M.D.   On: 04/13/2016 00:32    Micro Results      Recent Results (from the past 240 hour(s))  MRSA PCR Screening     Status: None   Collection Time: 04/13/16  7:36 AM  Result Value Ref Range Status   MRSA by PCR NEGATIVE NEGATIVE Final    Comment:        The GeneXpert MRSA Assay (FDA approved for NASAL specimens only), is one component of a comprehensive MRSA colonization surveillance program. It is not intended to diagnose MRSA infection nor to guide or monitor treatment for MRSA infections.     Today   Subjective    Brent Byrd today has no headache,no chest abdominal pain,no new weakness tingling or numbness, feels much better wants to go home today.     Objective   Blood pressure 118/80, pulse 69, temperature 98 F (36.7 C), temperature source Oral, resp. rate 18, height 6\' 4"  (1.93 m), weight 110.859 kg (244 lb 6.4 oz), SpO2 98 %.   Intake/Output Summary (Last 24 hours) at 04/13/16 1702 Last data filed at 04/13/16 1256  Gross per 24 hour  Intake 767.43 ml  Output    600 ml  Net 167.43 ml    Exam  Awake Alert, Oriented x 3, No new F.N deficits, Normal affect [place].AT,PERRAL Supple Neck,No JVD, No cervical lymphadenopathy appriciated.  Symmetrical Chest wall movement, Good air movement bilaterally, CTAB RRR,No Gallops,Rubs or new Murmurs, No Parasternal Heave +ve B.Sounds, Abd Soft, Non tender, No organomegaly appriciated, No rebound -guarding or rigidity. No Cyanosis, Clubbing or edema,  No new Rash or bruise   Data Review   CBC w Diff:  Lab Results  Component Value Date   WBC 5.9 04/12/2016   WBC 5.5 07/29/2014   HGB 12.3* 04/12/2016   HGB 14.0 07/29/2014   HCT 37.9* 04/12/2016   HCT 43.9 07/29/2014   PLT 214 04/12/2016   PLT 208 07/29/2014   LYMPHOPCT 50 04/12/2016   LYMPHOPCT 35.4 07/16/2014   MONOPCT 6 04/12/2016   MONOPCT 10.0 07/16/2014   EOSPCT 4 04/12/2016   EOSPCT 4.2 07/16/2014   BASOPCT 0 04/12/2016   BASOPCT 1.3 07/16/2014    CMP:  Lab Results  Component Value Date   NA 137 04/13/2016   NA 138 07/29/2014   K 3.9 04/13/2016   K 3.9 07/29/2014   CL 111 04/13/2016   CL 108* 07/29/2014   CO2 18* 04/13/2016   CO2 21 07/29/2014   BUN 8 04/13/2016   BUN 10 07/29/2014   CREATININE 0.89 04/13/2016   CREATININE 0.92 07/29/2014   PROT 6.7 04/13/2016   PROT 8.2 07/29/2014   ALBUMIN 3.6 04/13/2016   ALBUMIN 4.1 07/29/2014   BILITOT 0.2* 04/13/2016   BILITOT 0.5 07/29/2014   ALKPHOS 59 04/13/2016   ALKPHOS 74 07/29/2014   AST 24 04/13/2016   AST 16 07/29/2014   ALT 16* 04/13/2016   ALT 26 07/29/2014  .   Total Time in preparing paper work, data evaluation and todays exam - 35 minutes  Thurnell Lose M.D on 04/13/2016 at [facility]   Office  [phone]

## 2016-04-13 NOTE — Progress Notes (Signed)
Echocardiogram 2D Echocardiogram has been performed.  Tresa Res 04/13/2016, 3:28 PM

## 2016-04-13 NOTE — ED Notes (Signed)
Dr. Blaine Hamper made aware of critical lab

## 2016-04-13 NOTE — Discharge Instructions (Signed)
Follow with Primary MD Philis Fendt, MD in 7 days   Get CBC, CMP, 2 view Chest X ray checked  by Primary MD next visit.    Activity: As tolerated with Full fall precautions use walker/cane & assistance as needed   Disposition Home     Diet:   Heart Healthy , Low Carb  For Heart failure patients - Check your Weight same time everyday, if you gain over 2 pounds, or you develop in leg swelling, experience more shortness of breath or chest pain, call your Primary MD immediately. Follow Cardiac Low Salt Diet and 1.5 lit/day fluid restriction.   On your next visit with your primary care physician please Get Medicines reviewed and adjusted.   Please request your Prim.MD to go over all Hospital Tests and Procedure/Radiological results at the follow up, please get all Hospital records sent to your Prim MD by signing hospital release before you go home.   If you experience worsening of your admission symptoms, develop shortness of breath, life threatening emergency, suicidal or homicidal thoughts you must seek medical attention immediately by calling 911 or calling your MD immediately  if symptoms less severe.  You Must read complete instructions/literature along with all the possible adverse reactions/side effects for all the Medicines you take and that have been prescribed to you. Take any new Medicines after you have completely understood and accpet all the possible adverse reactions/side effects.   Do not drive, operate heavy machinery, perform activities at heights, swimming or participation in water activities or provide baby sitting services if your were admitted for syncope or siezures until you have seen by Primary MD or a Neurologist and advised to do so again.  Do not drive when taking Pain medications.    Do not take more than prescribed Pain, Sleep and Anxiety Medications  Special Instructions: If you have smoked or chewed Tobacco  in the last 2 yrs please stop smoking, stop  any regular Alcohol  and or any Recreational drug use.  Wear Seat belts while driving.   Please note  You were cared for by a hospitalist during your hospital stay. If you have any questions about your discharge medications or the care you received while you were in the hospital after you are discharged, you can call the unit and asked to speak with the hospitalist on call if the hospitalist that took care of you is not available. Once you are discharged, your primary care physician will handle any further medical issues. Please note that NO REFILLS for any discharge medications will be authorized once you are discharged, as it is imperative that you return to your primary care physician (or establish a relationship with a primary care physician if you do not have one) for your aftercare needs so that they can reassess your need for medications and monitor your lab values.

## 2016-04-13 NOTE — Progress Notes (Signed)
ANTICOAGULATION CONSULT NOTE - Follow Up Consult  Pharmacy Consult for Eliquis Indication: r/o DVT/PE  Allergies  Allergen Reactions  . Iodine Hives and Other (See Comments)  . Omnipaque [Iohexol] Other (See Comments)    IV Dye-Sneezing    Patient Measurements: Height: 6\' 4"  (193 cm) Weight: 244 lb 6.4 oz (110.859 kg) IBW/kg (Calculated) : 86.8 Heparin Dosing Weight: 112 kg  Vital Signs: Temp: 98 F (36.7 C) (06/19 1201) Temp Source: Oral (06/19 0416) BP: 125/97 mmHg (06/19 1100) Pulse Rate: 71 (06/19 1100)  Labs:  Recent Labs  04/12/16 2342 04/13/16 0142 04/13/16 0548 04/13/16 1125  HGB 12.3*  --   --   --   HCT 37.9*  --   --   --   PLT 214  --   --   --   APTT  --  >200*  --   --   LABPROT  --  15.2  --   --   INR  --  1.18  --   --   HEPARINUNFRC  --   --  0.66 0.46  CREATININE 0.93 0.89  --   --   TROPONINI  --  <0.03 <0.03  --     Estimated Creatinine Clearance: 121.9 mL/min (by C-G formula based on Cr of 0.89).   Assessment: 60 y.o. M presents with CP and SOB and started IV heparin, VQ scan wnl, but doppler is positive for deep or superficial thrombosis of R-small saphenous vein. Pharmacy is consulted to switch heparin to Eliquis. Scr 0.89, est. crcl > 100 ml/min.   Goal of Therapy:  Monitor platelets by anticoagulation protocol: Yes   Plan:  - Eliquis 10 mg po BID x 7 days, then decrease dose to 5mg  po BID on 6/27 - Monitor CBC  Maryanna Shape, PharmD, BCPS  Clinical Pharmacist  Pager: [phone]   04/13/2016,3:45 PM

## 2016-04-13 NOTE — Plan of Care (Signed)
Problem: Education: Goal: Knowledge of [facility] Education information/materials will improve Outcome: Progressing Educated the client about testing today to see if there is a PE or DVT and why we were testing for that. Also about pain, the scale, new medications, and plan for today.

## 2016-04-13 NOTE — Progress Notes (Signed)
VASCULAR LAB PRELIMINARY  PRELIMINARY  PRELIMINARY  PRELIMINARY  Bilateral lower extremity venous duplex has been completed.     Findings consistent with acute versus subacute deep vein or superficial thrombosis involving the right small saphenous vein at the junction.   Left:  No evidence of DVT, superficial thrombosis.  Bilaterally : no Baker's cyst.   Janifer Adie, RVT, RDMS 04/13/2016, 3:26 PM

## 2016-04-13 NOTE — Progress Notes (Signed)
ANTICOAGULATION CONSULT NOTE - Follow Up Consult  Pharmacy Consult for Heparin Indication: r/o DVT/PE  Allergies  Allergen Reactions  . Iodine Hives and Other (See Comments)  . Omnipaque [Iohexol] Other (See Comments)    IV Dye-Sneezing    Patient Measurements: Height: 6\' 4"  (193 cm) Weight: 244 lb 6.4 oz (110.859 kg) IBW/kg (Calculated) : 86.8 Heparin Dosing Weight: 112 kg  Vital Signs: Temp: 98.1 F (36.7 C) (06/19 0821) Temp Source: Oral (06/19 0416) BP: 103/64 mmHg (06/19 0416) Pulse Rate: 78 (06/19 0300)  Labs:  Recent Labs  04/12/16 2342 04/13/16 0142 04/13/16 0548  HGB 12.3*  --   --   HCT 37.9*  --   --   PLT 214  --   --   APTT  --  >200*  --   LABPROT  --  15.2  --   INR  --  1.18  --   HEPARINUNFRC  --   --  0.66  CREATININE 0.93 0.89  --   TROPONINI  --  <0.03 <0.03    Estimated Creatinine Clearance: 121.9 mL/min (by C-G formula based on Cr of 0.89).   Assessment: 60 y.o. M presents with CP and SOB. To begin heparin for presumed PE. CBC ok on admission. No AC PTA. Recent travel to [place]. D-dimer elevated to 1.6  PMH: COPD, h/o DVT 2008, HTN, depression  Anticoagulation: h/o DVTheparin for presumed PE. CBC ok on admission. No AC PTA. Hgb 12.3. Plts 214. When oral anticoagulation added, will need to stop NSAID. Heparin level 0.66 ok.  Goal of Therapy:  Heparin level 0.3-0.7 units/ml Monitor platelets by anticoagulation protocol: Yes   Plan:  Heparin gtt at 1800 units/hr F/u LE doppler F/u VQ scan Confirm therapeutic level in 6hrs.  Tori Cupps S. Alford Highland, PharmD, BCPS Clinical Staff Pharmacist Pager [phone]  Eilene Ghazi Stillinger 04/13/2016,8:46 AM

## 2016-04-13 NOTE — H&P (Addendum)
History and Physical    Brent Byrd [id number] DOB: 1956-10-13 DOA: 04/12/2016  Referring MD/NP/PA:   PCP: Philis Fendt, MD   Patient coming from:  The patient is coming from home.  At baseline, pt is independent for most of ADL.        Chief Complaint: Shortness of breath and chest pain  HPI: Brent Byrd is a 60 y.o. male with medical history significant of hypertension, COPD, GERD, depression, tobacco abuse, alcohol abuse, drug abuse, DVT, CAD, myocardial infarction, stroke, who presents with chest pain and shortness of breath.  When I saw pt in ED, he just received one dose of ativan. He is very drowsy, difficult to get history in detail. The history is limited. Per his girlfriend, patient started having chest pain and shortness of breath in this morning. His chest pain is severe, located in the frontal chest, constant. It is associated with shortness of breath, patient speaks full sentences. He has mild dry cough. His girlfriend states that the patient traveled to and back from [place] by driving 2 weeks ago. He did not complain of tenderness over calf areas. He looks very uncomfortable in bed per EDP. EMS, patient had one episode of hypotension with systolic blood pressure 88, was given 250 cc NS by EMS, bp improved to 98/75 now.  Per his girlfriend, patient does not seem to have nausea, vomiting, diarrhea, abdominal pain. He did not complaints of symptoms of UTI. He moves all extremities. No fever or chills.  ED Course: pt was found to have elevated d-dimer 1.60, troponin negative, BNP 77, WBC 5.9, temperature normal, no tachycardia, no tachypnea, O2 saturation 96%, ABG with pH of 7.300, PCO2 39, PO2 148. Chest x-ray showed cardiomegaly with congestive changes, small left pleural effusion and left lung base atelectasis versus infiltrate.  Pt was given 20 mg of lasix by EDP. Pt is admitted to stepdown for further intervention and treatment.  Review of Systems:   General: no  fevers, chills, no changes in body weight, has fatigue HEENT: no blurry vision, hearing changes or sore throat Pulm: has dyspnea, coughing, no wheezing CV: has chest pain, no palpitations Abd: no nausea, vomiting, abdominal pain, diarrhea, constipation GU: no dysuria, burning on urination, increased urinary frequency, hematuria  Ext: no leg edema Neuro: no unilateral weakness, numbness, or tingling, no vision change or hearing loss Skin: no rash MSK: No muscle spasm, no deformity, no limitation of range of movement in spin Heme: No easy bruising.  Travel history: No recent long distant travel.  Allergy:  Allergies  Allergen Reactions  . Iodine Hives and Other (See Comments)  . Omnipaque [Iohexol] Other (See Comments)    IV Dye-Sneezing    Past Medical History  Diagnosis Date  . Substance abuse     Alcohol dependence Hx  . Chest pain     hx  . COPD (chronic obstructive pulmonary disease) ([place])   . Gastroesophageal reflux disease 2014  . MI, old   . DVT (deep venous thrombosis) ([place]) 2008    Right leg- txed with coumadin   . Arthritis     Left knee, lower back  . Hypertension      per patiente-controlled, was taken off BP med  . Depression     no meds  . Tobacco abuse   . Alcohol abuse     Past Surgical History  Procedure Laterality Date  . Spine surgery      S/P Herniated disc  . Wisdom tooth extraction    .  Wrist surgery Left     Social History:  reports that he has been smoking Cigarettes.  He has a 44 pack-year smoking history. He has never used smokeless tobacco. He reports that he drinks about 4.2 oz of alcohol per week. He reports that he uses illicit drugs ("Crack" cocaine).  Family History:  Family History  Problem Relation Age of Onset  . Asthma Mother   . Cancer Father     Lung  . Cancer Sister   . Thrombosis Sister   . Diabetes Sister   . Colon cancer Neg Hx   . Rectal cancer Neg Hx   . Stomach cancer Neg Hx   . Esophageal cancer Neg Hx       Prior to Admission medications   Medication Sig Start Date End Date Taking? Authorizing Provider  naproxen (NAPROSYN) 500 MG tablet Take 500 mg by mouth 2 (two) times daily with a meal. As needed for back pain   Yes Historical Provider, MD  nitroGLYCERIN (NITROSTAT) 0.4 MG SL tablet Place 0.4 mg under the tongue every 5 (five) minutes as needed for chest pain. Reported on 02/25/2016   Yes Historical Provider, MD  ibuprofen (ADVIL,MOTRIN) 800 MG tablet Take 1 tablet (800 mg total) by mouth 3 (three) times daily. Patient not taking: Reported on 02/25/2016 09/30/15   Courteney Lyn Mackuen, MD    Physical Exam: Filed Vitals:   04/12/16 2249  BP: 121/88  Pulse: 75  Temp: 98.2 F (36.8 C)  TempSrc: Oral  Resp: 12  Height: 6\' 4"  (1.93 m)  Weight: 117.935 kg (260 lb)  SpO2: 96%   General: Not in acute distress. Dry mucous membrane. HEENT:       Eyes: PERRL, EOMI, no scleral icterus.       ENT: No discharge from the ears and nose, no pharynx injection, no tonsillar enlargement.        Neck: No JVD, no bruit, no mass felt. Heme: No neck lymph node enlargement. Cardiac: S1/S2, RRR, No murmurs, No gallops or rubs. Pulm: No rales, wheezing, rhonchi or rubs. Abd: Soft, nondistended, nontender, no rebound pain, no organomegaly, BS present. GU: No hematuria Ext: No pitting leg edema bilaterally. 2+DP/PT pulse bilaterally. Musculoskeletal: No joint deformities, No joint redness or warmth, no limitation of ROM in spin. Skin: No rashes.  Neuro: drowsy, but oriented X3, cranial nerves II-XII grossly intact, moves all extremities normally. Psych: Patient is not psychotic, no suicidal or hemocidal ideation.  Labs on Admission: I have personally reviewed following labs and imaging studies  CBC:  Recent Labs Lab 04/12/16 2342  WBC 5.9  NEUTROABS 2.4  HGB 12.3*  HCT 37.9*  MCV 92.9  PLT [id number]   Basic Metabolic Panel:  Recent Labs Lab 04/12/16 2342  NA 135  K 3.5  CL 108  CO2 19*   GLUCOSE 94  BUN 10  CREATININE 0.93  CALCIUM 8.5*   GFR: Estimated Creatinine Clearance: 120 mL/min (by C-G formula based on Cr of 0.93). Liver Function Tests:  Recent Labs Lab 04/12/16 2342  AST 25  ALT 15*  ALKPHOS 61  BILITOT 0.3  PROT 6.6  ALBUMIN 3.4*   No results for input(s): LIPASE, AMYLASE in the last 168 hours. No results for input(s): AMMONIA in the last 168 hours. Coagulation Profile:  Recent Labs Lab 04/13/16 0142  INR 1.18   Cardiac Enzymes: No results for input(s): CKTOTAL, CKMB, CKMBINDEX, TROPONINI in the last 168 hours. BNP (last 3 results) No results for input(s): PROBNP  in the last 8760 hours. HbA1C: No results for input(s): HGBA1C in the last 72 hours. CBG: No results for input(s): GLUCAP in the last 168 hours. Lipid Profile: No results for input(s): CHOL, HDL, LDLCALC, TRIG, CHOLHDL, LDLDIRECT in the last 72 hours. Thyroid Function Tests: No results for input(s): TSH, T4TOTAL, FREET4, T3FREE, THYROIDAB in the last 72 hours. Anemia Panel: No results for input(s): VITAMINB12, FOLATE, FERRITIN, TIBC, IRON, RETICCTPCT in the last 72 hours. Urine analysis:    Component Value Date/Time   COLORURINE YELLOW 10/15/2015 1818   COLORURINE Yellow 07/30/2014 0850   APPEARANCEUR CLEAR 10/15/2015 1818   APPEARANCEUR Hazy 07/30/2014 0850   LABSPEC 1.006 10/15/2015 1818   LABSPEC 1.015 07/30/2014 0850   PHURINE 6.0 10/15/2015 1818   PHURINE 5.0 07/30/2014 0850   GLUCOSEU NEGATIVE 10/15/2015 1818   GLUCOSEU Negative 07/30/2014 0850   HGBUR NEGATIVE 10/15/2015 1818   HGBUR 1+ 07/30/2014 0850   BILIRUBINUR NEGATIVE 10/15/2015 1818   BILIRUBINUR Negative 07/30/2014 [place] 10/15/2015 1818   KETONESUR Negative 07/30/2014 0850   PROTEINUR NEGATIVE 10/15/2015 1818   PROTEINUR Negative 07/30/2014 0850   NITRITE NEGATIVE 10/15/2015 1818   NITRITE Negative 07/30/2014 0850   LEUKOCYTESUR NEGATIVE 10/15/2015 1818   LEUKOCYTESUR Negative  07/30/2014 0850   Sepsis Labs: @LABRCNTIP (procalcitonin:4,lacticidven:4) )No results found for this or any previous visit (from the past 240 hour(s)).   Radiological Exams on Admission: Dg Chest [place] 1 View  04/13/2016  CLINICAL DATA:  60 year old male with shortness of breath EXAM: PORTABLE CHEST 1 VIEW COMPARISON:  Chest radiograph dated 05/17/2015 FINDINGS: Single pleural view of the chest demonstrates mild moderate cardiomegaly with central vascular prominence compatible with congestive changes. Left lung base atelectasis versus infiltrate as well as a small left pleural effusion noted. There is no pneumothorax. No acute osseous pathology. IMPRESSION: Cardiomegaly with congestive changes. Small left pleural effusion and left lung base atelectasis versus infiltrate. Clinical correlation is recommended. Electronically Signed   By: Anner Crete M.D.   On: 04/13/2016 00:32     EKG: Independently reviewed. Sinus rhythm, QTC 445, anteroseptal infarction pattern, mild T-wave inversion in inferior leads and V5-V6.  Assessment/Plan Principal Problem:   Acute respiratory distress (HCC) Active Problems:   Cigarette smoker   Chest pain   SOB (shortness of breath)   Hypotension   Alcohol abuse   Acute respiratory distress and CP: has acute respiratory distress and chest pain. He has history of DVT, recent long distant traveling, and elevate D-dimer, highly suspect pulmonary embolism. Given his history of CAD, myocardial infarction, hypertension and T-wave inversion on EKG, ACS is a potential differential diagnosis. No fever, leukocytosis, unlikely to have pneumonia. No wheezing or rhonchi are auscultation, less likely to have COPD exacerbation.  -Admit to stepdown for close monitoring overnight -heparin drip initiated -2D echocardiogram ordered -LE dopplers ordered to evaluate for DVT -repeat EKG in a.m. -trop x 3 -2d echo. -start ASA per rectal-->will switch to oral when more  alert -pain control: When necessary morphine (pt is too drowsy to take oral pain meds now) -will get V/Q scan in AM. -check UDS, A1c and FLP -prn BiPAP -IV pepcid for possible alcoholic gastritis  Hypotension: likely due to dehydration and possible PE. Pt is clinically dry, but received one dose of Lasix in the emergency room. -gentle IVF: NS 75 cc/h for 6h  COPD: No wheezing or rhonchi on auscultation. No acute access patient. -When necessary albuterol nebulizer  Tobacco abuse and Alcohol abuse: -Nicotine patch -  CIWA protocol -Need to do counseling about the importance of quitting drinking when pt is more alert  Hx of stroke: he moves all extremities. Need more evaluation when patient is more alert. -Started aspirin as above   DVT ppx: on IV Heparin  Code Status: Full code Family Communication:  Yes, patient's girl friend at bed side Disposition Plan:  Anticipate discharge back to previous home environment Consults called:  none Admission status: SDU/inpation       Date of Service 04/13/2016    Ivor Costa [facility] Pager [phone]  If 7PM-7AM, please contact night-coverage [email or web address] Password [email or web address] 04/13/2016, 2:13 AM

## 2016-04-13 NOTE — Consult Note (Signed)
CARDIOLOGY CONSULT NOTE   Patient ID: Brent Byrd MRN: [id number] DOB/AGE: [birth year] 60 y.o.  Admit date: 04/12/2016  Primary Physician   Brent Fendt, MD Primary Cardiologist   [place] Reason for Consultation  Chest pain Requesting Physician  Dr. Candiss Norse  HPI: Brent Byrd is a 60 y.o. male with a history of MI, CAD, HTN, DVT, COPD, stroke, alcohol and ongoing tobacco abuse who came for evaluation of SOB.   The patient said he had MI many years ago. Cath was normal and no stent was placed. Unable to find records. Review of records indicates that patient admitted to [facility] 01/2014 for chest pain. The patient underwent a nuclear medicine stress test which essentially showed a low risk scan with an EF of 56%. Very difficult to obtain history. He was supposed to see Dr. Debara Pickett mid 2016 for chest pain however never followed up. States that he saw a cardiologist in [place] many years ago, unable to provide name.  The patient had a sudden episode of shortness of breath yesterday with some chest discomfort. Recent travel to [place]. He described the chest pain around sternal/left chest. No radiation of pain. Pain lasted for a few hours and did not improve with sublingual nitroglycerin and EMS was called. Patient had one episode of hypotension with systolic blood pressure 88, was given 250 cc NS by EMS, bp improved to 98/75.   D-dimer elevated to 1.6. On IV heparin prophylactically. Pending VQ scan and echo. Only negative. BNP normal. Chest x-ray showed cardiomegaly with congestive changes, small left pleural effusion and left lung base atelectasis versus infiltrate. Pt was given 20 mg of lasix by EDP. EKG showed NSR and TWI in inferior lateral lead which is chronic.   Past Medical History  Diagnosis Date  . Substance abuse     Alcohol dependence Hx  . Chest pain     hx  . COPD (chronic obstructive pulmonary disease) ([place])   . Gastroesophageal reflux disease  2014  . MI, old   . DVT (deep venous thrombosis) ([place]) 2008    Right leg- txed with coumadin   . Arthritis     Left knee, lower back  . Hypertension      per patiente-controlled, was taken off BP med  . Depression     no meds  . Tobacco abuse   . Alcohol abuse      Past Surgical History  Procedure Laterality Date  . Spine surgery      S/P Herniated disc  . Wisdom tooth extraction    . Wrist surgery Left     Allergies  Allergen Reactions  . Iodine Hives and Other (See Comments)  . Omnipaque [Iohexol] Other (See Comments)    IV Dye-Sneezing    I have reviewed the patient's current medications . aspirin  300 mg Rectal Daily  . famotidine (PEPCID) IV  20 mg Intravenous Q12H  . folic acid  1 mg Oral Daily  . LORazepam  0-4 mg Intravenous Q6H   Followed by  . [START ON 04/15/2016] LORazepam  0-4 mg Intravenous Q12H  . naproxen  500 mg Oral BID WC  . nicotine  21 mg Transdermal Daily  . thiamine  100 mg Oral Daily   Or  . thiamine  100 mg Intravenous Daily   . 0.9 % NaCl with KCl 40 mEq / L 75 mL/hr (04/13/16 0714)  . heparin 1,800 Units/hr (04/13/16 0134)   albuterol, LORazepam **OR** LORazepam, morphine injection, nitroGLYCERIN,  ondansetron  Prior to Admission medications   Medication Sig Start Date End Date Taking? Authorizing Provider  naproxen (NAPROSYN) 500 MG tablet Take 500 mg by mouth 2 (two) times daily with a meal. As needed for back pain   Yes Historical Provider, MD  nitroGLYCERIN (NITROSTAT) 0.4 MG SL tablet Place 0.4 mg under the tongue every 5 (five) minutes as needed for chest pain. Reported on 02/25/2016   Yes Historical Provider, MD  ibuprofen (ADVIL,MOTRIN) 800 MG tablet Take 1 tablet (800 mg total) by mouth 3 (three) times daily. Patient not taking: Reported on 02/25/2016 09/30/15   Courteney Lyn Mackuen, MD     Social History   Social History  . Marital Status: Married    Spouse Name: N/A  . Number of Children: N/A  . Years of Education: N/A    Occupational History  . Auto Mechanic    Social History Main Topics  . Smoking status: Current Every Day Smoker -- 1.00 packs/day for 44 years    Types: Cigarettes  . Smokeless tobacco: Never Used  . Alcohol Use: 4.2 oz/week    7 Cans of beer per week     Comment: hx of heavy drinking  . Drug Use: Yes    Special: "Crack" cocaine     Comment: last use12/2016  . Sexual Activity: Not on file   Other Topics Concern  . Not on file   Social History Narrative    Family Status  Relation Status Death Age  . Mother Deceased     Respiratory Failure  . Father Deceased     Lung Cancer  . Sister Deceased     Cancer   Family History  Problem Relation Age of Onset  . Asthma Mother   . Cancer Father     Lung  . Cancer Sister   . Thrombosis Sister   . Diabetes Sister   . Colon cancer Neg Hx   . Rectal cancer Neg Hx   . Stomach cancer Neg Hx   . Esophageal cancer Neg Hx      ROS:  Full 14 point review of systems complete and found to be negative unless listed above.  Physical Exam: Blood pressure 103/64, pulse 78, temperature 97.5 F (36.4 C), temperature source Oral, resp. rate 18, height 6\' 4"  (1.93 m), weight 244 lb 6.4 oz (110.859 kg), SpO2 92 %.  General: Well developed, well nourished, male in no acute distress Head: Eyes PERRLA, No xanthomas. Normocephalic and atraumatic, oropharynx without edema or exudate.  Lungs: Resp regular and unlabored, CTA. Heart: RRR no s3, s4, or murmurs..   Neck: No carotid bruits. No lymphadenopathy.  No JVD. Abdomen: Bowel sounds present, abdomen soft and non-tender without masses or hernias noted. Msk:  No spine or cva tenderness. No weakness, no joint deformities or effusions. Extremities: No clubbing, cyanosis or edema. DP/PT/Radials 2+ and equal bilaterally. Neuro: Alert and oriented X 3. No focal deficits noted. Psych:  Good affect, responds appropriately Skin: No rashes or lesions noted.  Labs:   Lab Results  Component Value  Date   WBC 5.9 04/12/2016   HGB 12.3* 04/12/2016   HCT 37.9* 04/12/2016   MCV 92.9 04/12/2016   PLT 214 04/12/2016    Recent Labs  04/13/16 0142  INR 1.18    Recent Labs Lab 04/12/16 2342  NA 135  K 3.5  CL 108  CO2 19*  BUN 10  CREATININE 0.93  CALCIUM 8.5*  PROT 6.6  BILITOT 0.3  ALKPHOS  61  ALT 15*  AST 25  GLUCOSE 94  ALBUMIN 3.4*   MAGNESIUM  Date Value Ref Range Status  03/27/2014 1.9 mg/dL Final    Comment:    1.8-2.4 THERAPEUTIC RANGE: 4-7 mg/dL TOXIC: > 10 mg/dL  -----------------------     Recent Labs  04/13/16 0142 04/13/16 0548  TROPONINI <0.03 <0.03    Recent Labs  04/12/16 2351  TROPIPOC 0.00   No results found for: PROBNP Lab Results  Component Value Date   CHOL 187 04/13/2016   HDL 76 04/13/2016   LDLCALC 101* 04/13/2016   TRIG 48 04/13/2016   Lab Results  Component Value Date   DDIMER 1.60* 04/12/2016   No results found for: LIPASE, AMYLASE THYROID STIMULATING HORM  Date/Time Value Ref Range Status  03/26/2014 08:32 PM 1.42 uIU/mL Final    Comment:    0.45-4.50 (International Unit)  ----------------------- Pregnant patients have  different reference  ranges for TSH:  - - - - - - - - - -  Pregnant, first trimetser:  0.36 - 2.50 uIU/mL    No results found for: VITAMINB12, FOLATE, FERRITIN, TIBC, IRON, RETICCTPCT  Echo: Pending  ECG:   EKG showed NSR and TWI in inferior lateral lead which is chronic.   Myoivew 01/2014 Summary  1. No significant wall motion abnormality noted.  2. Overall, this is a Low risk scan.  3. Pharmacological myocardial perfusion study with no significant  ischemia.  4. The estimated ejection fraction is 56%.  5. The left ventricular global function was normal.  6. There are no EKG changes concerning for ischemia.  7. There is no artifact noted on this study.  Diagnosing Physician: [id number] Bartholome Bill MD  Electronically signed at 11:52:57 AM on 02/17/2014     Radiology:  Dg Chest [place] 1 View  04/13/2016  CLINICAL DATA:  60 year old male with shortness of breath EXAM: PORTABLE CHEST 1 VIEW COMPARISON:  Chest radiograph dated 05/17/2015 FINDINGS: Single pleural view of the chest demonstrates mild moderate cardiomegaly with central vascular prominence compatible with congestive changes. Left lung base atelectasis versus infiltrate as well as a small left pleural effusion noted. There is no pneumothorax. No acute osseous pathology. IMPRESSION: Cardiomegaly with congestive changes. Small left pleural effusion and left lung base atelectasis versus infiltrate. Clinical correlation is recommended. Electronically Signed   By: Anner Crete M.D.   On: 04/13/2016 00:32    ASSESSMENT AND PLAN:     1. Acute respiratory distress (HCC) - On IV heparin for possible PE. Hx of DVT and recent travel. Pending VQ scan and echo.   2. Chest pain - Does not sounds anginal. EKG without acute changes. Troponin negative. ? Hx of CAD. Unable to find records or the patient able to provide any specific information. No inpatient workup unless abnormal echo. Needs to wait 48 hours post VQ scan for stress test.  Myoview 01/2014 was low risk.   3. Hx of DVT - Pending LE doppler.   4. Cigarette smoker - Smoking cessation recommended. Education given.   SignedLeanor Kail, PA 04/13/2016, 8:12 AM Pager [phone]  Co-Sign MD Patient seen and examined and history reviewed. Agree with above findings and plan. 60 yo BM with history of tobacco abuse presents with acute worsening of SOB. Some mild chest pain that felt like a "vice" around his chest. Complains of cough and sinus drainage. Normal Myoview in 2015. Denies recent cocaine use. Ecg is without acute change. troponins are all normal. D-dimer elevated concerning for possible  PE. BNP normal and CXR with Atx. No evidence of CHF. Echo is pending. I agree with pursuing work up for PE- per primary team. Unless Echo shows  new wall motion abnormality I don't recommend further evaluation for coronary ischemia this admission.  Peter Martinique, [place] 04/13/2016 9:19 AM

## 2016-04-14 LAB — HEMOGLOBIN A1C
HEMOGLOBIN A1C: 5.6 % (ref 4.8–5.6)
MEAN PLASMA GLUCOSE: 114 mg/dL

## 2016-04-30 ENCOUNTER — Encounter: Payer: Self-pay | Admitting: *Deleted

## 2016-04-30 ENCOUNTER — Ambulatory Visit: Payer: Medicaid Other | Admitting: Cardiology

## 2016-08-11 ENCOUNTER — Emergency Department (HOSPITAL_COMMUNITY)
Admission: EM | Admit: 2016-08-11 | Discharge: 2016-08-11 | Disposition: A | Discharge status: Home / Self Care | Payer: Medicaid Other | Attending: Emergency Medicine | Admitting: Emergency Medicine

## 2016-08-11 ENCOUNTER — Emergency Department (HOSPITAL_COMMUNITY): Payer: Medicaid Other

## 2016-08-11 ENCOUNTER — Encounter (HOSPITAL_COMMUNITY): Payer: Self-pay | Admitting: Emergency Medicine

## 2016-08-11 DIAGNOSIS — Y929 Unspecified place or not applicable: Secondary | ICD-10-CM | POA: Insufficient documentation

## 2016-08-11 DIAGNOSIS — J449 Chronic obstructive pulmonary disease, unspecified: Secondary | ICD-10-CM | POA: Insufficient documentation

## 2016-08-11 DIAGNOSIS — F17210 Nicotine dependence, cigarettes, uncomplicated: Secondary | ICD-10-CM | POA: Insufficient documentation

## 2016-08-11 DIAGNOSIS — Y939 Activity, unspecified: Secondary | ICD-10-CM | POA: Diagnosis not present

## 2016-08-11 DIAGNOSIS — R079 Chest pain, unspecified: Secondary | ICD-10-CM | POA: Diagnosis present

## 2016-08-11 DIAGNOSIS — Z7901 Long term (current) use of anticoagulants: Secondary | ICD-10-CM | POA: Insufficient documentation

## 2016-08-11 DIAGNOSIS — R0789 Other chest pain: Secondary | ICD-10-CM | POA: Insufficient documentation

## 2016-08-11 DIAGNOSIS — W108XXA Fall (on) (from) other stairs and steps, initial encounter: Secondary | ICD-10-CM | POA: Diagnosis not present

## 2016-08-11 DIAGNOSIS — M25561 Pain in right knee: Secondary | ICD-10-CM | POA: Insufficient documentation

## 2016-08-11 DIAGNOSIS — Y999 Unspecified external cause status: Secondary | ICD-10-CM | POA: Insufficient documentation

## 2016-08-11 DIAGNOSIS — I252 Old myocardial infarction: Secondary | ICD-10-CM | POA: Insufficient documentation

## 2016-08-11 DIAGNOSIS — I2510 Atherosclerotic heart disease of native coronary artery without angina pectoris: Secondary | ICD-10-CM | POA: Insufficient documentation

## 2016-08-11 DIAGNOSIS — W19XXXA Unspecified fall, initial encounter: Secondary | ICD-10-CM

## 2016-08-11 DIAGNOSIS — I10 Essential (primary) hypertension: Secondary | ICD-10-CM | POA: Diagnosis not present

## 2016-08-11 DIAGNOSIS — Z8673 Personal history of transient ischemic attack (TIA), and cerebral infarction without residual deficits: Secondary | ICD-10-CM | POA: Insufficient documentation

## 2016-08-11 LAB — BASIC METABOLIC PANEL
ANION GAP: 6 (ref 5–15)
BUN: 12 mg/dL (ref 6–20)
CALCIUM: 9.1 mg/dL (ref 8.9–10.3)
CO2: 26 mmol/L (ref 22–32)
Chloride: 108 mmol/L (ref 101–111)
Creatinine, Ser: 1.08 mg/dL (ref 0.61–1.24)
Glucose, Bld: 99 mg/dL (ref 65–99)
POTASSIUM: 4.4 mmol/L (ref 3.5–5.1)
SODIUM: 140 mmol/L (ref 135–145)

## 2016-08-11 LAB — I-STAT TROPONIN, ED
TROPONIN I, POC: 0.03 ng/mL (ref 0.00–0.08)
TROPONIN I, POC: 0.01 ng/mL (ref 0.00–0.08)

## 2016-08-11 LAB — CBC
HEMATOCRIT: 41.8 % (ref 39.0–52.0)
HEMOGLOBIN: 14.5 g/dL (ref 13.0–17.0)
MCH: 32.4 pg (ref 26.0–34.0)
MCHC: 34.7 g/dL (ref 30.0–36.0)
MCV: 93.3 fL (ref 78.0–100.0)
Platelets: 184 10*3/uL (ref 150–400)
RBC: 4.48 MIL/uL (ref 4.22–5.81)
RDW: 14.2 % (ref 11.5–15.5)
WBC: 3.5 10*3/uL — AB (ref 4.0–10.5)

## 2016-08-11 MED ORDER — IBUPROFEN 400 MG PO TABS
400.0000 mg | ORAL_TABLET | Freq: Once | ORAL | Status: AC
  Administered 2016-08-11: 400 mg via ORAL
  Filled 2016-08-11: qty 1

## 2016-08-11 MED ORDER — HYDROCODONE-ACETAMINOPHEN 5-325 MG PO TABS
1.0000 | ORAL_TABLET | Freq: Once | ORAL | Status: AC
  Administered 2016-08-11: 1 via ORAL
  Filled 2016-08-11: qty 1

## 2016-08-11 NOTE — ED Notes (Signed)
Pt states he understands instructions and will follow up . Home ambulatory with steady gaiot.

## 2016-08-11 NOTE — ED Notes (Signed)
Opt back from xray hooked back up to monitor

## 2016-08-11 NOTE — Discharge Instructions (Addendum)
You may take over the counter pain medications such as ibuprofen, Aleve, Motrin, Tylenol, etc for your pain Please start taking your Eliquis again Also take your heartburn medication Make an appointment to follow up with your PCP Make an appointment to see cardiology

## 2016-08-11 NOTE — ED Notes (Signed)
States started to have cp on Sunday, got dizzy and fell stairs on Sunday about 13 stairs and hurt rt knee and , knot on left lower leg and rt wrist states did not bump head, has not taken his meds since Sunday, eliquis, has dvt in rt leg

## 2016-08-11 NOTE — ED Provider Notes (Signed)
[facility] DEPT Provider Note   CSN: [id number] Arrival date & time: 08/11/16  [id number]     History   Chief Complaint Chief Complaint  Patient presents with  . Chest Pain    HPI Brent Byrd is a 60 year old man with history of HTN, COPD, recurrent DVT on Eliquis, GERD, tobacco/alcohol/drug abuse, CAD, CVA, Depression.  HPI  He presents with chest pain. Started Sunday. It is squeezing in nature across his whole chest. Has some associated dizziness. Denies vertigo. Dizziness has resolved. The pain remained constant. He took two nitro yesterday with no relief. No relieving factors. No exacerbating factors. No recent sicknesses. No recent travel or long car rides.   He fell on Sunday about 13 stair steps. He has right knee pain and right wrist pain. Denies head trauma or LOC. He has not been taking his Eliquis since Sunday.  He has chronic dyspnea. He has GERD and has not been taking his PPI since Sunday. Previously reports taking it regularly.  He was hospitalized 6/18 to 6/19 for atypical chest pain. LE venous duplex with DVT. He was placed on Eliquis.   Past Medical History:  Diagnosis Date  . Alcohol abuse   . Arthritis    Left knee, lower back  . Chest pain    hx  . COPD (chronic obstructive pulmonary disease) ([place])   . Depression    no meds  . DVT (deep venous thrombosis) ([place]) 2008   Right leg- txed with coumadin   . Gastroesophageal reflux disease 2014  . Hypertension     per patiente-controlled, was taken off BP med  . MI, old   . Substance abuse    Alcohol dependence Hx  . Tobacco abuse     Patient Active Problem List   Diagnosis Date Noted  . SOB (shortness of breath) 04/13/2016  . Hypotension 04/13/2016  . Acute respiratory distress 04/13/2016  . Substance abuse   . Chest pain   . COPD (chronic obstructive pulmonary disease) ([place])   . Gastroesophageal reflux disease   . Hypertension   . Depression   . Tobacco abuse   . Alcohol abuse   . Cigarette  smoker 05/18/2015  . Asthmatic bronchitis with acute exacerbation 05/17/2015    Past Surgical History:  Procedure Laterality Date  . SPINE SURGERY     S/P Herniated disc  . WISDOM TOOTH EXTRACTION    . WRIST SURGERY Left        Home Medications    Prior to Admission medications   Medication Sig Start Date End Date Taking? Authorizing Provider  apixaban (ELIQUIS) 5 MG TABS tablet Take 1 tablet (5 mg total) by mouth 2 (two) times daily. 04/21/16  Yes Thurnell Lose, MD  folic acid (FOLVITE) 1 MG tablet Take 1 tablet (1 mg total) by mouth daily. 04/13/16  Yes Thurnell Lose, MD  naproxen (NAPROSYN) 500 MG tablet Take 500 mg by mouth 2 (two) times daily with a meal. As needed for back pain   Yes Historical Provider, MD  nitroGLYCERIN (NITROSTAT) 0.4 MG SL tablet Place 0.4 mg under the tongue every 5 (five) minutes as needed for chest pain. Reported on 02/25/2016   Yes Historical Provider, MD  thiamine (VITAMIN B-1) 100 MG tablet Take 1 tablet (100 mg total) by mouth daily. 04/13/16  Yes Thurnell Lose, MD  apixaban (ELIQUIS) 5 MG TABS tablet Take 2 tablets (10 mg total) by mouth 2 (two) times daily. 04/13/16 04/19/16  Thurnell Lose, MD  Family History Family History  Problem Relation Age of Onset  . Asthma Mother   . Cancer Father     Lung  . Cancer Sister   . Thrombosis Sister   . Diabetes Sister   . Colon cancer Neg Hx   . Rectal cancer Neg Hx   . Stomach cancer Neg Hx   . Esophageal cancer Neg Hx     Social History Social History  Substance Use Topics  . Smoking status: Current Every Day Smoker    Packs/day: 1.00    Years: 44.00    Types: Cigarettes  . Smokeless tobacco: Never Used  . Alcohol use 4.2 oz/week    7 Cans of beer per week     Comment: hx of heavy drinking  1 beer Sunday, none since. Last cocaine use 3-4 months ago.   Allergies   Iodine and Omnipaque [iohexol]   Review of Systems Review of Systems Constitutional: no fevers/chills Eyes: no  vision changes Ears, nose, mouth, throat, and face: +cough, non productive. No change from baseline. Respiratory: +shortness of breath Cardiovascular: +chest pain Gastrointestinal: no nausea/vomiting, no abdominal pain, no constipation, no diarrhea Genitourinary: no dysuria, no hematuria Integument: no rash Hematologic/lymphatic: no bleeding/bruising, no edema Musculoskeletal: no arthralgias, no myalgias Neurological: no paresthesias, no weakness   Physical Exam Updated Vital Signs BP (!) 139/103   Pulse 66   Temp 97 F (36.1 C) (Oral)   Resp 20   Ht 6\' 4"  (1.93 m)   Wt 113.4 kg   SpO2 96%   BMI 30.43 kg/m   Physical Exam General Apperance: NAD Head: Normocephalic, atraumatic Eyes: PERRL, EOMI, anicteric sclera Ears: Normal external ear canal Nose: Nares normal, septum midline, mucosa normal Throat: Lips, mucosa and tongue normal  Neck: Supple, trachea midline. No JVD Back: No tenderness or bony abnormality  Lungs: Clear to auscultation bilaterally. No wheezes, rhonchi or rales. Breathing comfortably Chest Wall: TTP across left chest, no deformity Heart: Regular rate and rhythm, no murmur/rub/gallop Abdomen: Soft, nontender, nondistended, no rebound/guarding Extremities: Warm and well perfused, no edema. LLE with firm mass over mid tibia. RLE with tenderness to palpation lateral knee. Right wrist with tenderness to palpation along anterior surface. Pulses: 2+ throughout Skin: No rashes or lesions Neurologic: Alert and oriented x 3. CNII-XII intact. Normal strength and sensation   ED Treatments / Results  Labs (all labs ordered are listed, but only abnormal results are displayed) Labs Reviewed  CBC - Abnormal; Notable for the following:       Result Value   WBC 3.5 (*)    All other components within normal limits  BASIC METABOLIC PANEL  I-STAT TROPOININ, ED  I-STAT TROPOININ, ED    EKG  EKG Interpretation  Date/Time:  Tuesday August 11 2016 09:58:49  EDT Ventricular Rate:  84 PR Interval:  180 QRS Duration: 86 QT Interval:  370 QTC Calculation: 437 R Axis:   33 Text Interpretation:  Normal sinus rhythm Left ventricular hypertrophy ST & T wave abnormality, consider inferolateral ischemia Confirmed by Ashok Cordia  MD, Lennette Bihari ([id number]) on 08/11/2016 1:10:02 PM     Normal sinus rhythm, unchanged from previous EKG  Radiology Dg Chest 2 View  Result Date: 08/11/2016 CLINICAL DATA:  Two day history of chest pain. Intermittent shortness of breath EXAM: CHEST  2 VIEW COMPARISON:  April 12, 2016 chest radiograph and chest CT Feb 27, 2013 FINDINGS: There is no edema or consolidation. Heart is upper normal in size with pulmonary vascularity within normal limits.  No adenopathy. Thoracic aorta is mildly prominent in a generalized manner but stable. No adenopathy. No bone lesions. IMPRESSION: Stable prominence of the thoracic aorta. No edema or consolidation. Heart upper normal in size. Electronically Signed   By: Lowella Grip III M.D.   On: 08/11/2016 10:27   Dg Wrist Complete Right  Result Date: 08/11/2016 CLINICAL DATA:  Pain following fall EXAM: RIGHT WRIST - COMPLETE 3+ VIEW COMPARISON:  September 28, 2008 FINDINGS: Frontal, oblique, lateral, and ulnar deviation scaphoid images were obtained. There is a focal area of calcification medial to the distal radius at the joint space level, likely residua from a prior avulsion type injury. This finding is stable from prior study. There is no evident acute fracture or dislocation. There is no appreciable joint space narrowing. No erosive change. IMPRESSION: Evidence of old avulsion slightly medial to the distal radial articular surface. No acute fracture or dislocation. No evident arthropathy. Electronically Signed   By: Lowella Grip III M.D.   On: 08/11/2016 12:41   Dg Knee 2 Views Right  Result Date: 08/11/2016 CLINICAL DATA:  Pain following fall EXAM: RIGHT KNEE - 1-2 VIEW COMPARISON:  None. FINDINGS:  Frontal and lateral views obtained. There is no fracture or dislocation. No joint effusion. Joint spaces appear unremarkable. No erosive change. IMPRESSION: No fracture or joint effusion. No appreciable joint space narrowing or erosion. Electronically Signed   By: Lowella Grip III M.D.   On: 08/11/2016 12:39   Dg Tibia/fibula Left  Result Date: 08/11/2016 CLINICAL DATA:  Pain following fall EXAM: LEFT TIBIA AND FIBULA - 2 VIEW COMPARISON:  None. FINDINGS: Frontal and lateral views were obtained. There is no evident fracture or dislocation. Joint spaces appear unremarkable. No abnormal periosteal reaction. There are unfused apophyses along the proximal tibia anteriorly. IMPRESSION: No acute fracture or dislocation.  No evident arthropathy. Electronically Signed   By: Lowella Grip III M.D.   On: 08/11/2016 12:44   Procedures   Medications Ordered in ED Medications  ibuprofen (ADVIL,MOTRIN) tablet 400 mg (400 mg Oral Given 08/11/16 1350)  HYDROcodone-acetaminophen (NORCO/VICODIN) 5-325 MG per tablet 1 tablet (1 tablet Oral Given 08/11/16 1350)     Initial Impression / Assessment and Plan / ED Course  I have reviewed the triage vital signs and the nursing notes.  Pertinent labs & imaging results that were available during my care of the patient were reviewed by me and considered in my medical decision making (see chart for details).  Clinical Course  11:45am evaluated pt 2:15pm improved symptomatically  Final Clinical Impressions(s) / ED Diagnoses   Final diagnoses:  Atypical chest pain  Fall, initial encounter  Chest tightness since Sunday. Pain is not reproducible on palpation but he does have soreness on palpation. CXR with no acute cardiopulmonary process. He has a history of DVT but reports compliance of his Eliquis up until Sunday. Low risk by Wells Score for PE as well. Initial troponin 0.03 and EKG without acute ischemic changes. BMP and CBC unremarkable. Second troponin  POC 0.01. Chest pain possibly MSK or GERD. Will have him follow up with his PCP. Also should be reevaluated by cardiology as an outpatient. Information provided for him to make that appointment.   Self limited lightheadedness on Sunday that has resolved. XR of left tib fib, right knee and right wrist with no acute fracture or dislocation. Right wrist has old avulsion medial to the distal radial articular surface.   New Prescriptions New Prescriptions   No medications on  file     Milagros Loll, MD 08/11/16 1411    Milagros Loll, MD 08/11/16 [place], MD 08/14/16 [phone]

## 2016-08-11 NOTE — ED Triage Notes (Signed)
Patient states chest pain since Sunday with some dizziness on Sunday.  Patient states dizziness resolved, but chest pain remained.

## 2016-08-17 ENCOUNTER — Emergency Department (HOSPITAL_COMMUNITY)
Admission: EM | Admit: 2016-08-17 | Discharge: 2016-08-17 | Disposition: A | Discharge status: Home / Self Care | Payer: Medicaid Other | Attending: Emergency Medicine | Admitting: Emergency Medicine

## 2016-08-17 ENCOUNTER — Encounter (HOSPITAL_COMMUNITY): Payer: Self-pay | Admitting: Emergency Medicine

## 2016-08-17 ENCOUNTER — Emergency Department (HOSPITAL_COMMUNITY): Payer: Medicaid Other

## 2016-08-17 DIAGNOSIS — I252 Old myocardial infarction: Secondary | ICD-10-CM | POA: Insufficient documentation

## 2016-08-17 DIAGNOSIS — M542 Cervicalgia: Secondary | ICD-10-CM | POA: Insufficient documentation

## 2016-08-17 DIAGNOSIS — Y999 Unspecified external cause status: Secondary | ICD-10-CM | POA: Diagnosis not present

## 2016-08-17 DIAGNOSIS — M25561 Pain in right knee: Secondary | ICD-10-CM

## 2016-08-17 DIAGNOSIS — M791 Myalgia: Secondary | ICD-10-CM | POA: Diagnosis not present

## 2016-08-17 DIAGNOSIS — Y939 Activity, unspecified: Secondary | ICD-10-CM | POA: Insufficient documentation

## 2016-08-17 DIAGNOSIS — Y92410 Unspecified street and highway as the place of occurrence of the external cause: Secondary | ICD-10-CM | POA: Insufficient documentation

## 2016-08-17 DIAGNOSIS — M79605 Pain in left leg: Secondary | ICD-10-CM

## 2016-08-17 DIAGNOSIS — I10 Essential (primary) hypertension: Secondary | ICD-10-CM | POA: Diagnosis not present

## 2016-08-17 DIAGNOSIS — Z23 Encounter for immunization: Secondary | ICD-10-CM | POA: Insufficient documentation

## 2016-08-17 DIAGNOSIS — J449 Chronic obstructive pulmonary disease, unspecified: Secondary | ICD-10-CM | POA: Insufficient documentation

## 2016-08-17 DIAGNOSIS — M7918 Myalgia, other site: Secondary | ICD-10-CM

## 2016-08-17 DIAGNOSIS — M25562 Pain in left knee: Secondary | ICD-10-CM

## 2016-08-17 DIAGNOSIS — Z7901 Long term (current) use of anticoagulants: Secondary | ICD-10-CM | POA: Insufficient documentation

## 2016-08-17 DIAGNOSIS — M79604 Pain in right leg: Secondary | ICD-10-CM

## 2016-08-17 DIAGNOSIS — S8991XA Unspecified injury of right lower leg, initial encounter: Secondary | ICD-10-CM | POA: Diagnosis present

## 2016-08-17 DIAGNOSIS — S80811A Abrasion, right lower leg, initial encounter: Secondary | ICD-10-CM | POA: Diagnosis not present

## 2016-08-17 DIAGNOSIS — M1710 Unilateral primary osteoarthritis, unspecified knee: Secondary | ICD-10-CM | POA: Diagnosis not present

## 2016-08-17 DIAGNOSIS — F17210 Nicotine dependence, cigarettes, uncomplicated: Secondary | ICD-10-CM | POA: Insufficient documentation

## 2016-08-17 MED ORDER — HYDROCODONE-ACETAMINOPHEN 5-325 MG PO TABS
1.0000 | ORAL_TABLET | Freq: Once | ORAL | Status: AC
  Administered 2016-08-17: 1 via ORAL
  Filled 2016-08-17: qty 1

## 2016-08-17 MED ORDER — TETANUS-DIPHTH-ACELL PERTUSSIS 5-2.5-18.5 LF-MCG/0.5 IM SUSP
0.5000 mL | Freq: Once | INTRAMUSCULAR | Status: AC
  Administered 2016-08-17: 0.5 mL via INTRAMUSCULAR
  Filled 2016-08-17: qty 0.5

## 2016-08-17 NOTE — ED Notes (Signed)
Patient transported to X-ray 

## 2016-08-17 NOTE — Discharge Instructions (Signed)
Take tylenol and motrin as needed for pain. Use knee sleeves as needed for comfort. Use ice to areas of soreness for the next 24 hours and then may move to heat, no more than 20 minutes at a time every hour for each. Elevate your knees to help with pain/swelling. Expect to be sore for the next few days and follow up with primary care physician for recheck of ongoing symptoms in the next 1-2 weeks. Return to ER for emergent changing or worsening of symptoms.
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
## 2016-08-17 NOTE — ED Notes (Signed)
Patient ambulated to restroom and walking around with steady gait.  States "I'm looking for the room my girlfriend is in"  Advised patient to remain in exam room so x ray tech can find him.  Patient walking without difficulty.

## 2016-08-17 NOTE — Progress Notes (Signed)
Orthopedic Tech Progress Note Patient Details:  Brent Byrd 1956/09/03 [id number]  Ortho Devices Type of Ortho Device: Knee Sleeve Ortho Device/Splint Location: bilateral Ortho Device/Splint Interventions: Application   Shaakira Borrero 08/17/2016, 3:59 PM
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
## 2016-10-25 ENCOUNTER — Emergency Department (HOSPITAL_COMMUNITY): Payer: Medicaid Other

## 2016-10-25 ENCOUNTER — Inpatient Hospital Stay (HOSPITAL_COMMUNITY)
Admission: EM | Admit: 2016-10-25 | Discharge: 2016-10-27 | DRG: 193 | Disposition: A | Discharge status: Home / Self Care | Payer: Medicaid Other | Attending: Internal Medicine | Admitting: Internal Medicine

## 2016-10-25 ENCOUNTER — Encounter (HOSPITAL_COMMUNITY): Payer: Self-pay

## 2016-10-25 DIAGNOSIS — F1010 Alcohol abuse, uncomplicated: Secondary | ICD-10-CM | POA: Diagnosis present

## 2016-10-25 DIAGNOSIS — F17210 Nicotine dependence, cigarettes, uncomplicated: Secondary | ICD-10-CM | POA: Diagnosis present

## 2016-10-25 DIAGNOSIS — Z809 Family history of malignant neoplasm, unspecified: Secondary | ICD-10-CM

## 2016-10-25 DIAGNOSIS — Z833 Family history of diabetes mellitus: Secondary | ICD-10-CM

## 2016-10-25 DIAGNOSIS — F329 Major depressive disorder, single episode, unspecified: Secondary | ICD-10-CM | POA: Diagnosis present

## 2016-10-25 DIAGNOSIS — J9601 Acute respiratory failure with hypoxia: Secondary | ICD-10-CM | POA: Diagnosis present

## 2016-10-25 DIAGNOSIS — Z9889 Other specified postprocedural states: Secondary | ICD-10-CM

## 2016-10-25 DIAGNOSIS — K219 Gastro-esophageal reflux disease without esophagitis: Secondary | ICD-10-CM | POA: Diagnosis present

## 2016-10-25 DIAGNOSIS — I252 Old myocardial infarction: Secondary | ICD-10-CM

## 2016-10-25 DIAGNOSIS — Z801 Family history of malignant neoplasm of trachea, bronchus and lung: Secondary | ICD-10-CM

## 2016-10-25 DIAGNOSIS — R079 Chest pain, unspecified: Secondary | ICD-10-CM | POA: Diagnosis present

## 2016-10-25 DIAGNOSIS — R0902 Hypoxemia: Secondary | ICD-10-CM

## 2016-10-25 DIAGNOSIS — Z7901 Long term (current) use of anticoagulants: Secondary | ICD-10-CM

## 2016-10-25 DIAGNOSIS — F1910 Other psychoactive substance abuse, uncomplicated: Secondary | ICD-10-CM | POA: Diagnosis present

## 2016-10-25 DIAGNOSIS — Z79899 Other long term (current) drug therapy: Secondary | ICD-10-CM

## 2016-10-25 DIAGNOSIS — Z86718 Personal history of other venous thrombosis and embolism: Secondary | ICD-10-CM

## 2016-10-25 DIAGNOSIS — M1990 Unspecified osteoarthritis, unspecified site: Secondary | ICD-10-CM | POA: Diagnosis present

## 2016-10-25 DIAGNOSIS — J189 Pneumonia, unspecified organism: Principal | ICD-10-CM | POA: Diagnosis present

## 2016-10-25 DIAGNOSIS — J449 Chronic obstructive pulmonary disease, unspecified: Secondary | ICD-10-CM | POA: Diagnosis present

## 2016-10-25 DIAGNOSIS — I10 Essential (primary) hypertension: Secondary | ICD-10-CM | POA: Diagnosis present

## 2016-10-25 DIAGNOSIS — J440 Chronic obstructive pulmonary disease with acute lower respiratory infection: Secondary | ICD-10-CM | POA: Diagnosis present

## 2016-10-25 DIAGNOSIS — J310 Chronic rhinitis: Secondary | ICD-10-CM | POA: Diagnosis present

## 2016-10-25 DIAGNOSIS — Z888 Allergy status to other drugs, medicaments and biological substances status: Secondary | ICD-10-CM

## 2016-10-25 DIAGNOSIS — Z825 Family history of asthma and other chronic lower respiratory diseases: Secondary | ICD-10-CM

## 2016-10-25 DIAGNOSIS — F32A Depression, unspecified: Secondary | ICD-10-CM | POA: Diagnosis present

## 2016-10-25 HISTORY — DX: Cerebral infarction, unspecified: I63.9

## 2016-10-25 LAB — I-STAT TROPONIN, ED: Troponin i, poc: 0.01 ng/mL (ref 0.00–0.08)

## 2016-10-25 LAB — TROPONIN I: Troponin I: <0.03 ng/mL (ref <0.03)

## 2016-10-25 LAB — BASIC METABOLIC PANEL
ANION GAP: 14 (ref 5–15)
BUN: 15 mg/dL (ref 6–20)
CHLORIDE: 105 mmol/L (ref 101–111)
CO2: 19 mmol/L — ABNORMAL LOW (ref 22–32)
Calcium: 9 mg/dL (ref 8.9–10.3)
Creatinine, Ser: 1.03 mg/dL (ref 0.61–1.24)
GFR calc Af Amer: >60 mL/min (ref >60)
Glucose, Bld: 87 mg/dL (ref 65–99)
POTASSIUM: 3.9 mmol/L (ref 3.5–5.1)
SODIUM: 138 mmol/L (ref 135–145)

## 2016-10-25 LAB — RAPID URINE DRUG SCREEN, HOSP PERFORMED
Amphetamines: NOT DETECTED
BENZODIAZEPINES: NOT DETECTED
Barbiturates: NOT DETECTED
COCAINE: POSITIVE — AB
OPIATES: POSITIVE — AB
Tetrahydrocannabinol: NOT DETECTED

## 2016-10-25 LAB — URINALYSIS, ROUTINE W REFLEX MICROSCOPIC
BACTERIA UA: NONE SEEN
Bilirubin Urine: NEGATIVE
GLUCOSE, UA: NEGATIVE mg/dL
Hgb urine dipstick: NEGATIVE
KETONES UR: NEGATIVE mg/dL
NITRITE: NEGATIVE
PH: 5 (ref 5.0–8.0)
Protein, ur: NEGATIVE mg/dL
Specific Gravity, Urine: 1.02 (ref 1.005–1.030)

## 2016-10-25 LAB — PHOSPHORUS: Phosphorus: 2.6 mg/dL (ref 2.5–4.6)

## 2016-10-25 LAB — I-STAT CG4 LACTIC ACID, ED
LACTIC ACID, VENOUS: 2.92 mmol/L — AB (ref 0.5–1.9)
Lactic Acid, Venous: 2.17 mmol/L (ref 0.5–1.9)

## 2016-10-25 LAB — CBC
HEMATOCRIT: 38.1 % — AB (ref 39.0–52.0)
HEMOGLOBIN: 13.5 g/dL (ref 13.0–17.0)
MCH: 32.5 pg (ref 26.0–34.0)
MCHC: 35.4 g/dL (ref 30.0–36.0)
MCV: 91.8 fL (ref 78.0–100.0)
Platelets: 122 10*3/uL — ABNORMAL LOW (ref 150–400)
RBC: 4.15 MIL/uL — AB (ref 4.22–5.81)
RDW: 13.8 % (ref 11.5–15.5)
WBC: 5.5 10*3/uL (ref 4.0–10.5)

## 2016-10-25 LAB — LACTIC ACID, PLASMA
Lactic Acid, Venous: 0.9 mmol/L (ref 0.5–1.9)
Lactic Acid, Venous: 1.7 mmol/L (ref 0.5–1.9)

## 2016-10-25 LAB — MAGNESIUM: Magnesium: 1.9 mg/dL (ref 1.7–2.4)

## 2016-10-25 MED ORDER — APIXABAN 5 MG PO TABS
5.0000 mg | ORAL_TABLET | Freq: Every day | ORAL | Status: DC
  Administered 2016-10-25: 5 mg via ORAL
  Filled 2016-10-25 (×2): qty 1

## 2016-10-25 MED ORDER — ASPIRIN 81 MG PO CHEW
324.0000 mg | CHEWABLE_TABLET | Freq: Once | ORAL | Status: DC
  Filled 2016-10-25: qty 4

## 2016-10-25 MED ORDER — LORAZEPAM 1 MG PO TABS: 0.0000 mg | ORAL_TABLET | Freq: Two times a day (BID) | ORAL | Status: DC

## 2016-10-25 MED ORDER — ACETAMINOPHEN 325 MG PO TABS
650.0000 mg | ORAL_TABLET | ORAL | Status: DC | PRN
  Administered 2016-10-25 – 2016-10-26 (×2): 650 mg via ORAL
  Filled 2016-10-25 (×2): qty 2

## 2016-10-25 MED ORDER — IPRATROPIUM-ALBUTEROL 0.5-2.5 (3) MG/3ML IN SOLN
3.0000 mL | Freq: Two times a day (BID) | RESPIRATORY_TRACT | Status: DC
  Administered 2016-10-26 (×2): 3 mL via RESPIRATORY_TRACT
  Filled 2016-10-25 (×2): qty 3

## 2016-10-25 MED ORDER — MORPHINE SULFATE (PF) 4 MG/ML IV SOLN
4.0000 mg | Freq: Once | INTRAVENOUS | Status: AC
  Administered 2016-10-25: 4 mg via INTRAVENOUS
  Filled 2016-10-25: qty 1

## 2016-10-25 MED ORDER — GUAIFENESIN ER 600 MG PO TB12
600.0000 mg | ORAL_TABLET | Freq: Two times a day (BID) | ORAL | Status: DC
  Administered 2016-10-25 – 2016-10-27 (×5): 600 mg via ORAL
  Filled 2016-10-25 (×5): qty 1

## 2016-10-25 MED ORDER — ONDANSETRON HCL 4 MG/2ML IJ SOLN: 4.0000 mg | Freq: Four times a day (QID) | INTRAMUSCULAR | Status: DC | PRN

## 2016-10-25 MED ORDER — SODIUM CHLORIDE 0.9 % IV SOLN
INTRAVENOUS | Status: DC
  Administered 2016-10-25: 08:00:00 via INTRAVENOUS
  Administered 2016-10-26: 1000 mL via INTRAVENOUS

## 2016-10-25 MED ORDER — METHYLPREDNISOLONE SODIUM SUCC 125 MG IJ SOLR
125.0000 mg | Freq: Once | INTRAMUSCULAR | Status: AC
  Administered 2016-10-25: 125 mg via INTRAVENOUS
  Filled 2016-10-25: qty 2

## 2016-10-25 MED ORDER — IPRATROPIUM-ALBUTEROL 0.5-2.5 (3) MG/3ML IN SOLN
3.0000 mL | Freq: Four times a day (QID) | RESPIRATORY_TRACT | Status: DC
Start: 2016-10-25 — End: 2016-10-25
  Administered 2016-10-25 (×3): 3 mL via RESPIRATORY_TRACT
  Filled 2016-10-25 (×3): qty 3

## 2016-10-25 MED ORDER — LORAZEPAM 1 MG PO TABS
0.0000 mg | ORAL_TABLET | Freq: Four times a day (QID) | ORAL | Status: AC
  Administered 2016-10-25 – 2016-10-26 (×2): 1 mg via ORAL
  Filled 2016-10-25 (×2): qty 1

## 2016-10-25 MED ORDER — APIXABAN 5 MG PO TABS
5.0000 mg | ORAL_TABLET | Freq: Two times a day (BID) | ORAL | Status: DC
  Administered 2016-10-25 – 2016-10-27 (×4): 5 mg via ORAL
  Filled 2016-10-25 (×4): qty 1

## 2016-10-25 MED ORDER — DEXTROSE 5 % IV SOLN
500.0000 mg | Freq: Once | INTRAVENOUS | Status: AC
  Administered 2016-10-25: 500 mg via INTRAVENOUS
  Filled 2016-10-25: qty 500

## 2016-10-25 MED ORDER — CEFTRIAXONE SODIUM 1 G IJ SOLR
1.0000 g | Freq: Once | INTRAMUSCULAR | Status: AC
  Administered 2016-10-25: 1 g via INTRAVENOUS
  Filled 2016-10-25: qty 10

## 2016-10-25 MED ORDER — ASPIRIN 81 MG PO CHEW
324.0000 mg | CHEWABLE_TABLET | Freq: Once | ORAL | Status: AC
  Administered 2016-10-25: 324 mg via ORAL
  Filled 2016-10-25: qty 4

## 2016-10-25 MED ORDER — AZITHROMYCIN 500 MG IV SOLR
500.0000 mg | INTRAVENOUS | Status: DC
  Administered 2016-10-26: 500 mg via INTRAVENOUS
  Filled 2016-10-25 (×2): qty 500

## 2016-10-25 MED ORDER — NITROGLYCERIN 0.4 MG SL SUBL
0.4000 mg | SUBLINGUAL_TABLET | SUBLINGUAL | Status: DC | PRN
  Administered 2016-10-25 (×2): 0.4 mg via SUBLINGUAL
  Filled 2016-10-25: qty 1

## 2016-10-25 MED ORDER — ALBUTEROL SULFATE (2.5 MG/3ML) 0.083% IN NEBU: 2.5000 mg | INHALATION_SOLUTION | RESPIRATORY_TRACT | Status: DC | PRN

## 2016-10-25 MED ORDER — MORPHINE SULFATE (PF) 4 MG/ML IV SOLN: 2.0000 mg | INTRAVENOUS | Status: DC | PRN

## 2016-10-25 MED ORDER — DEXTROSE 5 % IV SOLN
1.0000 g | INTRAVENOUS | Status: DC
  Administered 2016-10-26: 1 g via INTRAVENOUS
  Filled 2016-10-25 (×2): qty 10

## 2016-10-25 MED ORDER — SODIUM CHLORIDE 0.9 % IV BOLUS (SEPSIS)
1000.0000 mL | Freq: Once | INTRAVENOUS | Status: AC
  Administered 2016-10-25: 1000 mL via INTRAVENOUS

## 2016-10-25 MED ORDER — HYDRALAZINE HCL 20 MG/ML IJ SOLN: 10.0000 mg | Freq: Three times a day (TID) | INTRAMUSCULAR | Status: DC | PRN

## 2016-10-25 MED ORDER — SODIUM CHLORIDE 0.9 % IV BOLUS (SEPSIS)
500.0000 mL | Freq: Once | INTRAVENOUS | Status: AC
Start: 2016-10-25 — End: 2016-10-25
  Administered 2016-10-25: 500 mL via INTRAVENOUS

## 2016-10-25 MED ORDER — THIAMINE HCL 100 MG/ML IJ SOLN: 100.0000 mg | Freq: Every day | INTRAMUSCULAR | Status: DC

## 2016-10-25 MED ORDER — VITAMIN B-1 100 MG PO TABS
100.0000 mg | ORAL_TABLET | Freq: Every day | ORAL | Status: DC
  Administered 2016-10-25 – 2016-10-27 (×3): 100 mg via ORAL
  Filled 2016-10-25 (×3): qty 1

## 2016-10-25 MED ORDER — FLUTICASONE PROPIONATE 50 MCG/ACT NA SUSP
2.0000 | Freq: Every day | NASAL | Status: DC
  Administered 2016-10-27: 2 via NASAL
  Filled 2016-10-25: qty 16

## 2016-10-25 NOTE — ED Notes (Signed)
Called for patient's breakfast tray.

## 2016-10-25 NOTE — ED Provider Notes (Addendum)
[place] DEPT Provider Note   CSN: [id number] Arrival date & time: 10/25/16  0229   By signing my name below, I, Delton Prairie, attest that this documentation has been prepared under the direction and in the presence of Varney Biles, MD  Electronically Signed: Delton Prairie, ED Scribe. 10/25/16. 3:01 AM.   History   Chief Complaint Chief Complaint  Patient presents with  . Chest Pain    The history is provided by the patient. No language interpreter was used.   HPI Comments:  Brent Byrd is a 60 y.o. male, with a hx of chest pain, HTN, COPD and 2 MIs, who presents to the Emergency Department complaining of sudden onset, constant, left and right sided, squeezing chest pain which worsened around 7 PM yesterday. Pain is not worse with exertion. He has hx of DVT, and thinks that the pain is worse with breathing. He also reports resolved dizziness and resolved SOB since ER arrival - but wife reports heavy breathing at home even when patient was at rest. He notes a hx of MIs (last MI 10 years ago and treated at [facility] and [facility]), but this pain is different. He has taken nitroglycerin with no relief. Pt denies radiation of the pain, hx of cardiac stents, anxiety, aspirin use, Plavix use, any other associated symptoms and any other modifying factors at this time.   Pt was seen in the ED in 06/17 and was advised to follow up with a cardiologist to have a stress test which he did not do. Pt is on Eliquis. He is a smoker and notes heavy alcohol use stating he drinks about 2-3 40 ounce beers a day (last alcohol consumption 2 hours ago) Pt has a hx of remote cocaine use; last use about a year ago.    Past Medical History:  Diagnosis Date  . Alcohol abuse   . Arthritis    Left knee, lower back  . Chest pain    hx  . COPD (chronic obstructive pulmonary disease) ([place])   . Depression    no meds  . DVT (deep venous thrombosis) ([place]) 2008   Right leg- txed  with coumadin   . Gastroesophageal reflux disease 2014  . Hypertension     per patiente-controlled, was taken off BP med  . MI, old   . Substance abuse    Alcohol dependence Hx  . Tobacco abuse     Patient Active Problem List   Diagnosis Date Noted  . CAP (community acquired pneumonia) 10/25/2016  . SOB (shortness of breath) 04/13/2016  . Hypotension 04/13/2016  . Acute respiratory distress 04/13/2016  . Substance abuse   . Chest pain   . COPD (chronic obstructive pulmonary disease) ([place])   . Gastroesophageal reflux disease   . Hypertension   . Depression   . Tobacco abuse   . Alcohol abuse   . Cigarette smoker 05/18/2015  . Asthmatic bronchitis with acute exacerbation 05/17/2015    Past Surgical History:  Procedure Laterality Date  . SPINE SURGERY     S/P Herniated disc  . WISDOM TOOTH EXTRACTION    . WRIST SURGERY Left        Home Medications    Prior to Admission medications   Medication Sig Start Date End Date Taking? Authorizing Provider  apixaban (ELIQUIS) 5 MG TABS tablet Take 1 tablet (5 mg total) by mouth 2 (two) times daily. Patient taking differently: Take 5 mg by mouth daily.  04/21/16  Yes Prashant K  Candiss Norse, MD  folic acid (FOLVITE) 1 MG tablet Take 1 tablet (1 mg total) by mouth daily. 04/13/16  Yes Thurnell Lose, MD  nitroGLYCERIN (NITROSTAT) 0.4 MG SL tablet Place 0.4 mg under the tongue every 5 (five) minutes as needed for chest pain. Reported on 02/25/2016   Yes Historical Provider, MD  thiamine (VITAMIN B-1) 100 MG tablet Take 1 tablet (100 mg total) by mouth daily. 04/13/16  Yes Thurnell Lose, MD  apixaban (ELIQUIS) 5 MG TABS tablet Take 2 tablets (10 mg total) by mouth 2 (two) times daily. Patient not taking: Reported on 10/25/2016 04/13/16 10/25/16  Thurnell Lose, MD    Family History Family History  Problem Relation Age of Onset  . Asthma Mother   . Cancer Father     Lung  . Cancer Sister   . Thrombosis Sister   . Diabetes Sister     . Colon cancer Neg Hx   . Rectal cancer Neg Hx   . Stomach cancer Neg Hx   . Esophageal cancer Neg Hx     Social History Social History  Substance Use Topics  . Smoking status: Current Every Day Smoker    Packs/day: 1.00    Years: 44.00    Types: Cigarettes  . Smokeless tobacco: Never Used  . Alcohol use 4.2 oz/week    7 Cans of beer per week     Comment: hx of heavy drinking     Allergies   Iodine and Omnipaque [iohexol]   Review of Systems Review of Systems 10 systems reviewed and all are negative for acute change except as noted in the HPI.  Physical Exam Updated Vital Signs BP 113/85   Pulse 84   Temp 97.9 F (36.6 C) (Oral)   Resp 16   Ht 6\' 4"  (1.93 m)   Wt 255 lb (115.7 kg)   SpO2 92%   BMI 31.04 kg/m   Physical Exam  Constitutional: He is oriented to person, place, and time. He appears well-developed and well-nourished.  HENT:  Head: Normocephalic and atraumatic.  Eyes: EOM are normal.  Neck: Normal range of motion. No JVD present.  Cardiovascular: Normal rate, regular rhythm and intact distal pulses.   No murmur heard. Pulmonary/Chest: Effort normal. No respiratory distress. He has no wheezes.  Lungs clear except for bases - + crackles  Musculoskeletal: Normal range of motion.  Neurological: He is alert and oriented to person, place, and time.  Skin: Skin is warm and dry.  Psychiatric: He has a normal mood and affect. Judgment normal.  Nursing note and vitals reviewed.   ED Treatments / Results  DIAGNOSTIC STUDIES:  Oxygen Saturation is 97% on RA, normal by my interpretation.    COORDINATION OF CARE:  2:58 AM Discussed treatment plan with pt at bedside and pt agreed to plan.  Labs (all labs ordered are listed, but only abnormal results are displayed) Labs Reviewed  BASIC METABOLIC PANEL - Abnormal; Notable for the following:       Result Value   CO2 19 (*)    All other components within normal limits  CBC - Abnormal; Notable for the  following:    RBC 4.15 (*)    HCT 38.1 (*)    Platelets 122 (*)    All other components within normal limits  I-STAT CG4 LACTIC ACID, ED - Abnormal; Notable for the following:    Lactic Acid, Venous 2.92 (*)    All other components within normal limits  I-STAT  CG4 LACTIC ACID, ED - Abnormal; Notable for the following:    Lactic Acid, Venous 2.17 (*)    All other components within normal limits  CULTURE, BLOOD (ROUTINE X 2)  CULTURE, BLOOD (ROUTINE X 2)  URINE CULTURE  CULTURE, EXPECTORATED SPUTUM-ASSESSMENT  URINALYSIS, ROUTINE W REFLEX MICROSCOPIC  I-STAT TROPOININ, ED  I-STAT CG4 LACTIC ACID, ED    EKG  EKG Interpretation  Date/Time:  Sunday October 25 2016 02:35:17 EST Ventricular Rate:  101 PR Interval:    QRS Duration: 104 QT Interval:  324 QTC Calculation: 420 R Axis:   -33 Text Interpretation:  Sinus tachycardia LVH with secondary repolarization abnormality Anterior Q waves, possibly due to LVH Nonspecific ST and T wave abnormality No significant change since last tracing Confirmed by Kathrynn Humble, MD, Thelma Comp [phone]) on 10/25/2016 2:41:28 AM       Radiology Dg Chest 2 View  Result Date: 10/25/2016 CLINICAL DATA:  Chest pain. EXAM: CHEST  2 VIEW COMPARISON:  Most recent radiograph 08/11/2016 FINDINGS: The heart is normal in size. There is tortuosity of the thoracic aorta. There patchy bibasilar opacities, left greater than right. Upper lungs are clear. No pleural fluid or pneumothorax. No acute osseous abnormality. IMPRESSION: Patchy bibasilar opacities, left greater than right, suspicious for multifocal pneumonia. Atelectasis felt less likely particularly on the left. Electronically Signed   By: Jeb Levering M.D.   On: 10/25/2016 03:46    Procedures .Critical Care Performed by: Varney Biles Authorized by: Varney Biles   Critical care provider statement:    Critical care time (minutes):  50   Critical care time was exclusive of:  Separately billable  procedures and treating other patients   Critical care was necessary to treat or prevent imminent or life-threatening deterioration of the following conditions:  Sepsis and respiratory failure   Critical care was time spent personally by me on the following activities:  Blood draw for specimens, development of treatment plan with patient or surrogate, examination of patient, discussions with consultants, interpretation of cardiac output measurements, obtaining history from patient or surrogate, ordering and performing treatments and interventions, ordering and review of laboratory studies, ordering and review of radiographic studies, pulse oximetry and review of old charts   (including critical care time)  Medications Ordered in ED Medications  nitroGLYCERIN (NITROSTAT) SL tablet 0.4 mg (0.4 mg Sublingual Given 10/25/16 0344)  cefTRIAXone (ROCEPHIN) 1 g in dextrose 5 % 50 mL IVPB (not administered)  azithromycin (ZITHROMAX) 500 mg in dextrose 5 % 250 mL IVPB (not administered)  ipratropium-albuterol (DUONEB) 0.5-2.5 (3) MG/3ML nebulizer solution 3 mL (not administered)  albuterol (PROVENTIL) (2.5 MG/3ML) 0.083% nebulizer solution 2.5 mg (not administered)  morphine 4 MG/ML injection 4 mg (4 mg Intravenous Given 10/25/16 0302)  cefTRIAXone (ROCEPHIN) 1 g in dextrose 5 % 50 mL IVPB (0 g Intravenous Stopped 10/25/16 0456)  azithromycin (ZITHROMAX) 500 mg in dextrose 5 % 250 mL IVPB (0 mg Intravenous Stopped 10/25/16 0526)  sodium chloride 0.9 % bolus 1,000 mL (0 mLs Intravenous Stopped 10/25/16 0620)     Initial Impression / Assessment and Plan / ED Course  I have reviewed the triage vital signs and the nursing notes.  Pertinent labs & imaging results that were available during my care of the patient were reviewed by me and considered in my medical decision making (see chart for details).  Clinical Course     Pt comes in with cc of chest pain. Hx of DVT, ?MI and COPD. Pt has a dry cough  and dib associated with the symptoms. Pain is diffuse over the chest and not typical of cardiac pain. He was admitted in June, and was asked to see Cards for a stress, but he didn't f/u. Has remote cocaine abuse hx and alcoholism.  DDX: COPD exacerbation PNA ACS PUD  EKG is reassuring and trop is neg. CXR does show multifocal PNA. Pt's O2 sats have been dropping into upper 80s on room air. With hx of COPD, hypoxia -we will admit patient for CAP.  Final Clinical Impressions(s) / ED Diagnoses   Final diagnoses:  Community acquired pneumonia of left lung, unspecified part of lung  Hypoxia    New Prescriptions New Prescriptions   No medications on file   I personally performed the services described in this documentation, which was scribed in my presence. The recorded information has been reviewed and is accurate.    Varney Biles, MD 10/25/16 [place], MD 10/25/16 [phone]

## 2016-10-25 NOTE — Progress Notes (Signed)
Pharmacy Antibiotic Note  Brent Byrd is a 60 y.o. male admitted on 10/25/2016 with pneumonia and CAP.  Pharmacy has been consulted for Rocephin and Azithromycin dosing.  Plan: Rocephin 1gm IV q24h and Azithromycin 500mg  IV q24h Pharmacy will sign off - please reconsult if needed  Height: 6\' 4"  (193 cm) Weight: 255 lb (115.7 kg) IBW/kg (Calculated) : 86.8  Temp (24hrs), Avg:97.9 F (36.6 C), Min:97.9 F (36.6 C), Max:97.9 F (36.6 C)   Recent Labs Lab 10/25/16 0247  WBC 5.5  CREATININE 1.03    Estimated Creatinine Clearance: 106.1 mL/min (by C-G formula based on SCr of 1.03 mg/dL).    Allergies  Allergen Reactions  . Iodine Hives and Other (See Comments)  . Omnipaque [Iohexol] Other (See Comments)    IV Dye-Sneezing    Thank you for allowing pharmacy to be a part of this patient's care.  Sherlon Handing, PharmD, BCPS Clinical pharmacist, pager [phone] 10/25/2016 4:16 AM

## 2016-10-25 NOTE — H&P (Signed)
[facility] History and Physical  Brent Byrd [id number] DOB: 1956-04-29 DOA: 10/25/2016  Referring physician:  PCP: Philis Fendt, MD   Chief Complaint: "My chest hurts so bad."  HPI: Brent Byrd is a 60 y.o. male  with past medical history significant for myocardial infarction, COPD, cocaine abuse, tobacco abuse, alcohol abuse, hypertension and hyperlipidemia presents with chest pain. Pt states he has chronic cough and rhinitis. Wife I sick contact. Felt well until yesterday around 3 PM. At 3 PM had acute onset of right and left chest pain rated as severe and pressure. Patient took 3 nitroglycerin without relief of pain. EMS was called. EMS gave patient aspirin. Symptoms still bad patient came to the hospital. Chest pain better after more nitroglycerin. Patient denies any productive cough. Patient denies any fever, chills, nausea, vomiting, rash, headache. Patient denies any recent substance abuse other than alcohol and cigarettes. Patient's last drink was yesterday.  ED course: Patient given azithromycin and Rocephin. Also given 500 mL bolus of normal saline. Chest pain controlled. Hospitalist was consulted for admission.   Review of Systems:  As per HPI otherwise 10 point review of systems negative.    Past Medical History:  Diagnosis Date  . Alcohol abuse   . Arthritis    Left knee, lower back  . Chest pain    hx  . COPD (chronic obstructive pulmonary disease) ([place])   . Depression    no meds  . DVT (deep venous thrombosis) ([place]) 2008   Right leg- txed with coumadin   . Gastroesophageal reflux disease 2014  . Hypertension     per patiente-controlled, was taken off BP med  . MI, old   . Substance abuse    Alcohol dependence Hx  . Tobacco abuse    Past Surgical History:  Procedure Laterality Date  . SPINE SURGERY     S/P Herniated disc  . WISDOM TOOTH EXTRACTION    . WRIST SURGERY Left    Social History:  reports that he has been smoking Cigarettes.  He  has a 44.00 pack-year smoking history. He has never used smokeless tobacco. He reports that he drinks about 4.2 oz of alcohol per week . He reports that he uses drugs, including "Crack" cocaine.  Allergies  Allergen Reactions  . Iodine Hives and Other (See Comments)  . Omnipaque [Iohexol] Other (See Comments)    IV Dye-Sneezing    Family History  Problem Relation Age of Onset  . Asthma Mother   . Cancer Father     Lung  . Cancer Sister   . Thrombosis Sister   . Diabetes Sister   . Colon cancer Neg Hx   . Rectal cancer Neg Hx   . Stomach cancer Neg Hx   . Esophageal cancer Neg Hx      Prior to Admission medications   Medication Sig Start Date End Date Taking? Authorizing Provider  apixaban (ELIQUIS) 5 MG TABS tablet Take 1 tablet (5 mg total) by mouth 2 (two) times daily. Patient taking differently: Take 5 mg by mouth daily.  04/21/16  Yes Thurnell Lose, MD  folic acid (FOLVITE) 1 MG tablet Take 1 tablet (1 mg total) by mouth daily. 04/13/16  Yes Thurnell Lose, MD  nitroGLYCERIN (NITROSTAT) 0.4 MG SL tablet Place 0.4 mg under the tongue every 5 (five) minutes as needed for chest pain. Reported on 02/25/2016   Yes Historical Provider, MD  thiamine (VITAMIN B-1) 100 MG tablet Take 1 tablet (100 mg total)  by mouth daily. 04/13/16  Yes Thurnell Lose, MD  apixaban (ELIQUIS) 5 MG TABS tablet Take 2 tablets (10 mg total) by mouth 2 (two) times daily. Patient not taking: Reported on 10/25/2016 04/13/16 10/25/16  Thurnell Lose, MD   Physical Exam: Vitals:   10/25/16 0545 10/25/16 0600 10/25/16 0615 10/25/16 0630  BP: 115/80 111/80 117/83 113/85  Pulse: 90 90 86 84  Resp: 17 17 18 16   Temp:      TempSrc:      SpO2: 93% 90% 94% 92%  Weight:      Height:        Wt Readings from Last 3 Encounters:  10/25/16 115.7 kg (255 lb)  08/17/16 113.4 kg (250 lb)  08/11/16 113.4 kg (250 lb)    General:  Appears calm and comfortable, And mild illness distress, alert and oriented  3 Eyes:  PERRL, EOMI, normal lids, iris ENT:  grossly normal hearing, lips & tongue Neck:  no LAD, masses or thyromegaly Cardiovascular:  RRR, no m/r/g. No LE edema.  Respiratory:  Increased work of breathing, Rales are present, wheezes & rhonchi throughout Abdomen:  soft, ntnd Skin:  no rash or induration seen on limited exam Musculoskeletal:  grossly normal tone BUE/BLE Psychiatric:  grossly normal mood and affect, speech fluent and appropriate Neurologic:  CN 2-12 grossly intact, moves all extremities in coordinated fashion.          Labs on Admission:  Basic Metabolic Panel:  Recent Labs Lab 10/25/16 0247  NA 138  K 3.9  CL 105  CO2 19*  GLUCOSE 87  BUN 15  CREATININE 1.03  CALCIUM 9.0   Liver Function Tests: No results for input(s): AST, ALT, ALKPHOS, BILITOT, PROT, ALBUMIN in the last 168 hours. No results for input(s): LIPASE, AMYLASE in the last 168 hours. No results for input(s): AMMONIA in the last 168 hours. CBC:  Recent Labs Lab 10/25/16 0247  WBC 5.5  HGB 13.5  HCT 38.1*  MCV 91.8  PLT 122*   Cardiac Enzymes: No results for input(s): CKTOTAL, CKMB, CKMBINDEX, TROPONINI in the last 168 hours.  BNP (last 3 results)  Recent Labs  04/12/16 2334  BNP 77.0    ProBNP (last 3 results) No results for input(s): PROBNP in the last 8760 hours.   Serum creatinine: 1.03 mg/dL 10/25/16 0247 Estimated creatinine clearance: 106.1 mL/min  CBG: No results for input(s): GLUCAP in the last 168 hours.  Radiological Exams on Admission: Dg Chest 2 View  Result Date: 10/25/2016 CLINICAL DATA:  Chest pain. EXAM: CHEST  2 VIEW COMPARISON:  Most recent radiograph 08/11/2016 FINDINGS: The heart is normal in size. There is tortuosity of the thoracic aorta. There patchy bibasilar opacities, left greater than right. Upper lungs are clear. No pleural fluid or pneumothorax. No acute osseous abnormality. IMPRESSION: Patchy bibasilar opacities, left greater than right,  suspicious for multifocal pneumonia. Atelectasis felt less likely particularly on the left. Electronically Signed   By: Jeb Levering M.D.   On: 10/25/2016 03:46    I reviewed the chest x-ray and agree with the impression of the radiologist.  EKG: Independently reviewed. Trigger rate 99, PR interval 100, QRS duration 102, QTC 417, sinus rhythm, LVH, ST depression in V4 V5, septal Q waves - when compared to 08/11/2017 EKG V lead depressions are new  Assessment/Plan Principal Problem:   Acute respiratory failure with hypoxia (HCC) Active Problems:   Substance abuse   Chest pain   COPD (chronic obstructive pulmonary disease) (  [place])   Hypertension   Depression   Alcohol abuse   CAP (community acquired pneumonia)  CAP/COPD/Acute resp failure with hypoxia Scheduled DuoNeb's When necessary albuterol Antibiotic-Rocephin and Zithromax Oxygen therapy Continuous pulse oximetry Flutter/IT Prn albuterol for SOB Mucinex bid Trend LA With wheezing throughout lung fields concern for COPD exacerbation, giving dose of steroids  Hypertension When necessary hydralazine 10 mg IV as needed for severe blood pressure  CP Likely due to above Heart score 6 - serial trop ordered, initial neg - prn EKG CP - prn moprhine CP - prn ntg cp - asa in ED and QD - ECHO ordered for AM - tele bed, cardiac monitoring - ambien for sleep prn - zofran prn for nausea - checking mag/phos Cardio consulted by phone, advised drug cessation and f/u as OP for nuclear stress Per Dr. Marlou Porch EKG unchanged  Chronic Rhinitis Started on flonase Advise  Hx of DVT Cont Xarelto  Hx of cocaine use UDS ordered  Code Status: FULL  DVT Prophylaxis: xarelto Family Communication: wife at bedside Disposition Plan: Pending Improvement  Status: obs tele  Elwin Mocha, MD Family Medicine [facility] [email or web address] Password [email or web address]

## 2016-10-25 NOTE — ED Notes (Signed)
Spoke to Dr. Aggie Moats regarding patient's hold for telemetry bed with active cp. MD acknowledged and states patient is okay to go to tele bed, cp is not cardiac related.

## 2016-10-25 NOTE — ED Notes (Signed)
Given food per Dr. Kathrynn Humble okay.

## 2016-10-25 NOTE — ED Notes (Signed)
Hospitalist at bedside 

## 2016-10-25 NOTE — ED Triage Notes (Signed)
Patient comes from home for chest pain that began yesterday evening.  Hx of MI x2 and DVT 10 years ago.  Given 324 ASA and 2 nitro with no relief.  Patient complains of a pressure squeezing on both sides and cannot get relief.  A&Ox4

## 2016-10-25 NOTE — ED Notes (Signed)
Pt called out to use rest room. This NT assisted pt to restroom, pt felt dizzy while walking, wife st she would stay with him. Pt now alone in RR and advised if needed assistance to pull call bell.

## 2016-10-25 NOTE — ED Notes (Signed)
Lunch meal given. 

## 2016-10-25 NOTE — ED Notes (Signed)
Taken to xray at this time.  First NTG sl given with no change in pain.  Continues to rate cp at 10/10.  Appears more comfortable than he was on arrival but states its the same.

## 2016-10-25 NOTE — ED Notes (Signed)
Nurse getting blood 

## 2016-10-26 ENCOUNTER — Encounter (HOSPITAL_COMMUNITY): Payer: Self-pay | Admitting: General Practice

## 2016-10-26 ENCOUNTER — Observation Stay (HOSPITAL_COMMUNITY): Payer: Medicaid Other

## 2016-10-26 DIAGNOSIS — Z833 Family history of diabetes mellitus: Secondary | ICD-10-CM | POA: Diagnosis not present

## 2016-10-26 DIAGNOSIS — Z809 Family history of malignant neoplasm, unspecified: Secondary | ICD-10-CM | POA: Diagnosis not present

## 2016-10-26 DIAGNOSIS — F1010 Alcohol abuse, uncomplicated: Secondary | ICD-10-CM | POA: Diagnosis not present

## 2016-10-26 DIAGNOSIS — J449 Chronic obstructive pulmonary disease, unspecified: Secondary | ICD-10-CM | POA: Diagnosis not present

## 2016-10-26 DIAGNOSIS — J189 Pneumonia, unspecified organism: Principal | ICD-10-CM

## 2016-10-26 DIAGNOSIS — J9601 Acute respiratory failure with hypoxia: Secondary | ICD-10-CM | POA: Diagnosis present

## 2016-10-26 DIAGNOSIS — Z888 Allergy status to other drugs, medicaments and biological substances status: Secondary | ICD-10-CM | POA: Diagnosis not present

## 2016-10-26 DIAGNOSIS — I252 Old myocardial infarction: Secondary | ICD-10-CM | POA: Diagnosis not present

## 2016-10-26 DIAGNOSIS — M1990 Unspecified osteoarthritis, unspecified site: Secondary | ICD-10-CM | POA: Diagnosis present

## 2016-10-26 DIAGNOSIS — I10 Essential (primary) hypertension: Secondary | ICD-10-CM | POA: Diagnosis not present

## 2016-10-26 DIAGNOSIS — Z801 Family history of malignant neoplasm of trachea, bronchus and lung: Secondary | ICD-10-CM | POA: Diagnosis not present

## 2016-10-26 DIAGNOSIS — K219 Gastro-esophageal reflux disease without esophagitis: Secondary | ICD-10-CM | POA: Diagnosis present

## 2016-10-26 DIAGNOSIS — J310 Chronic rhinitis: Secondary | ICD-10-CM | POA: Diagnosis present

## 2016-10-26 DIAGNOSIS — R0902 Hypoxemia: Secondary | ICD-10-CM | POA: Diagnosis present

## 2016-10-26 DIAGNOSIS — Z86718 Personal history of other venous thrombosis and embolism: Secondary | ICD-10-CM | POA: Diagnosis not present

## 2016-10-26 DIAGNOSIS — F17210 Nicotine dependence, cigarettes, uncomplicated: Secondary | ICD-10-CM | POA: Diagnosis present

## 2016-10-26 DIAGNOSIS — J440 Chronic obstructive pulmonary disease with acute lower respiratory infection: Secondary | ICD-10-CM | POA: Diagnosis present

## 2016-10-26 DIAGNOSIS — Z79899 Other long term (current) drug therapy: Secondary | ICD-10-CM | POA: Diagnosis not present

## 2016-10-26 DIAGNOSIS — Z7901 Long term (current) use of anticoagulants: Secondary | ICD-10-CM | POA: Diagnosis not present

## 2016-10-26 DIAGNOSIS — Z825 Family history of asthma and other chronic lower respiratory diseases: Secondary | ICD-10-CM | POA: Diagnosis not present

## 2016-10-26 DIAGNOSIS — F329 Major depressive disorder, single episode, unspecified: Secondary | ICD-10-CM | POA: Diagnosis present

## 2016-10-26 DIAGNOSIS — Z9889 Other specified postprocedural states: Secondary | ICD-10-CM | POA: Diagnosis not present

## 2016-10-26 LAB — ECHOCARDIOGRAM COMPLETE
AVPHT: 1044 ms
CHL CUP MV DEC (S): 327
E decel time: 327 msec
E/e' ratio: 5.14
FS: 34 % (ref 28–44)
Height: 76 in
IV/PV OW: 0.99
LA ID, A-P, ES: 42 mm
LA diam end sys: 42 mm
LA diam index: 1.69 cm/m2
LA vol A4C: 103 ml
LA vol index: 36.6 mL/m2
LA vol: 90.7 mL
LV E/e'average: 5.14
LV e' LATERAL: 10.1 cm/s
LVEEMED: 5.14
Lateral S' vel: 22.9 cm/s
MVPKAVEL: 63.5 m/s
MVPKEVEL: 51.9 m/s
PW: 17 mm — AB (ref 0.6–1.1)
TAPSE: 24.8 mm
TDI e' lateral: 10.1
TDI e' medial: 6.4
WEIGHTICAEL: 4185.6 [oz_av]

## 2016-10-26 LAB — URINE CULTURE: CULTURE: NO GROWTH

## 2016-10-26 MED ORDER — HYDROCODONE-ACETAMINOPHEN 5-325 MG PO TABS
1.0000 | ORAL_TABLET | Freq: Four times a day (QID) | ORAL | Status: DC | PRN
  Administered 2016-10-26 – 2016-10-27 (×3): 1 via ORAL
  Filled 2016-10-26 (×3): qty 1

## 2016-10-26 NOTE — Progress Notes (Signed)
*  PRELIMINARY RESULTS* Echocardiogram 2D Echocardiogram has been performed.  Brent Byrd 10/26/2016, 3:12 PM

## 2016-10-26 NOTE — Discharge Instructions (Signed)
Information on my medicine - ELIQUIS (apixaban)  This medication education was reviewed with me or my healthcare representative as part of my discharge preparation.  The pharmacist that spoke with me during my hospital stay was:  Kai Levins, [facility]  Why was Eliquis prescribed for you? Eliquis was prescribed for you to reduce the risk of forming blood clots that can cause a stroke if you have a medical condition called atrial fibrillation (a type of irregular heartbeat) OR to reduce the risk of a blood clots forming after orthopedic surgery.  What do You need to know about Eliquis ? Take your Eliquis TWICE DAILY - one tablet in the morning and one tablet in the evening with or without food.  It would be best to take the doses about the same time each day.  If you have difficulty swallowing the tablet whole please discuss with your pharmacist how to take the medication safely.  Take Eliquis exactly as prescribed by your doctor and DO NOT stop taking Eliquis without talking to the doctor who prescribed the medication.  Stopping may increase your risk of developing a new clot or stroke.  Refill your prescription before you run out.  After discharge, you should have regular check-up appointments with your healthcare provider that is prescribing your Eliquis.  In the future your dose may need to be changed if your kidney function or weight changes by a significant amount or as you get older.  What do you do if you miss a dose? If you miss a dose, take it as soon as you remember on the same day and resume taking twice daily.  Do not take more than one dose of ELIQUIS at the same time.  Important Safety Information A possible side effect of Eliquis is bleeding. You should call your healthcare provider right away if you experience any of the following: ? Bleeding from an injury or your nose that does not stop. ? Unusual colored urine (red or dark brown) or unusual colored stools (red or  black). ? Unusual bruising for unknown reasons. ? A serious fall or if you hit your head (even if there is no bleeding).  Some medicines may interact with Eliquis and might increase your risk of bleeding or clotting while on Eliquis. To help avoid this, consult your healthcare provider or pharmacist prior to using any new prescription or non-prescription medications, including herbals, vitamins, non-steroidal anti-inflammatory drugs (NSAIDs) and supplements.  This website has more information on Eliquis (apixaban): [email or web address].

## 2016-10-26 NOTE — Progress Notes (Signed)
PROGRESS NOTE    Brent Byrd  [id number] DOB: [birth year] DOA: 10/25/2016 PCP: Philis Fendt, MD   Outpatient Specialists:     Brief Narrative:  Brent Byrd is a 61 y.o. male  with past medical history significant for myocardial infarction, COPD, cocaine abuse, tobacco abuse, alcohol abuse, hypertension and hyperlipidemia presents with chest pain. Pt states he has chronic cough and rhinitis. Wife I sick contact. Felt well until yesterday around 3 PM. At 3 PM had acute onset of right and left chest pain rated as severe and pressure. Patient took 3 nitroglycerin without relief of pain. EMS was called. EMS gave patient aspirin. Symptoms still bad patient came to the hospital. Chest pain better after more nitroglycerin. Patient denies any productive cough. Patient denies any fever, chills, nausea, vomiting, rash, headache. Patient denies any recent substance abuse other than alcohol and cigarettes. Patient's last drink was yesterday.   Assessment & Plan:   Principal Problem:   Acute respiratory failure with hypoxia (HCC) Active Problems:   Substance abuse   Chest pain   COPD (chronic obstructive pulmonary disease) (HCC)   Hypertension   Depression   Alcohol abuse   CAP (community acquired pneumonia)   Rhinitis, non-allergic   CAP/COPD Scheduled DuoNeb's When necessary albuterol Antibiotic-Rocephin and Zithromax Wean off O2 Continuous pulse oximetry Flutter/IT Prn albuterol for SOB Mucinex bid  Hypertension When necessary hydralazine 10 mg IV as needed for severe blood pressure  CP Likely due to above Heart score 6 -Cardio consulted by phone, advised drug cessation and f/u as OP for nuclear stress Per Dr. Marlou Porch EKG unchanged  Chronic Rhinitis Started on flonase  Hx of DVT Cont Xarelto  Hx of cocaine use + UDS  Plan to d/c in AM on PO abx   Subjective: C/o headache  Objective: Vitals:   10/25/16 2123 10/26/16 0148 10/26/16 0841 10/26/16 1250    BP: (!) 153/90 (!) 137/99  140/81  Pulse: 66 60  60  Resp: (!) 21 18  18   Temp: 98.7 F (37.1 C) 98.2 F (36.8 C)  98.6 F (37 C)  TempSrc: Oral Oral  Oral  SpO2: 95% 95% 96% 96%  Weight:      Height:        Intake/Output Summary (Last 24 hours) at 10/26/16 1334 Last data filed at 10/26/16 0300  Gross per 24 hour  Intake             1780 ml  Output                0 ml  Net             1780 ml   Filed Weights   10/25/16 0234 10/25/16 1612  Weight: 115.7 kg (255 lb) 118.7 kg (261 lb 9.6 oz)    Examination:  General exam: Appears calm and comfortable  Respiratory system: Clear to auscultation. Respiratory effort normal. Cardiovascular system: S1 & S2 heard, RRR. No JVD, murmurs, rubs, gallops or clicks. No pedal edema. Gastrointestinal system: Abdomen is nondistended, soft and nontender. No organomegaly or masses felt. Normal bowel sounds heard. Central nervous system: Alert and oriented. No focal neurological deficits.     Data Reviewed: I have personally reviewed following labs and imaging studies  CBC:  Recent Labs Lab 10/25/16 0247  WBC 5.5  HGB 13.5  HCT 38.1*  MCV 91.8  PLT 123XX123*   Basic Metabolic Panel:  Recent Labs Lab 10/25/16 0247 10/25/16 0717  NA 138  --  K 3.9  --   CL 105  --   CO2 19*  --   GLUCOSE 87  --   BUN 15  --   CREATININE 1.03  --   CALCIUM 9.0  --   MG  --  1.9  PHOS  --  2.6   GFR: Estimated Creatinine Clearance: 107.4 mL/min (by C-G formula based on SCr of 1.03 mg/dL). Liver Function Tests: No results for input(s): AST, ALT, ALKPHOS, BILITOT, PROT, ALBUMIN in the last 168 hours. No results for input(s): LIPASE, AMYLASE in the last 168 hours. No results for input(s): AMMONIA in the last 168 hours. Coagulation Profile: No results for input(s): INR, PROTIME in the last 168 hours. Cardiac Enzymes:  Recent Labs Lab 10/25/16 0717 10/25/16 1138 10/25/16 1623  TROPONINI <0.03 <0.03 <0.03   BNP (last 3 results) No  results for input(s): PROBNP in the last 8760 hours. HbA1C: No results for input(s): HGBA1C in the last 72 hours. CBG: No results for input(s): GLUCAP in the last 168 hours. Lipid Profile: No results for input(s): CHOL, HDL, LDLCALC, TRIG, CHOLHDL, LDLDIRECT in the last 72 hours. Thyroid Function Tests: No results for input(s): TSH, T4TOTAL, FREET4, T3FREE, THYROIDAB in the last 72 hours. Anemia Panel: No results for input(s): VITAMINB12, FOLATE, FERRITIN, TIBC, IRON, RETICCTPCT in the last 72 hours. Urine analysis:    Component Value Date/Time   COLORURINE YELLOW 10/25/2016 0903   APPEARANCEUR CLEAR 10/25/2016 0903   APPEARANCEUR Hazy 07/30/2014 0850   LABSPEC 1.020 10/25/2016 0903   LABSPEC 1.015 07/30/2014 0850   PHURINE 5.0 10/25/2016 0903   GLUCOSEU NEGATIVE 10/25/2016 0903   GLUCOSEU Negative 07/30/2014 0850   HGBUR NEGATIVE 10/25/2016 0903   BILIRUBINUR NEGATIVE 10/25/2016 0903   BILIRUBINUR Negative 07/30/2014 0850   KETONESUR NEGATIVE 10/25/2016 0903   PROTEINUR NEGATIVE 10/25/2016 0903   NITRITE NEGATIVE 10/25/2016 0903   LEUKOCYTESUR SMALL (A) 10/25/2016 0903   LEUKOCYTESUR Negative 07/30/2014 0850     ) Recent Results (from the past 240 hour(s))  Urine culture     Status: None   Collection Time: 10/25/16  9:03 AM  Result Value Ref Range Status   Specimen Description URINE, RANDOM  Final   Special Requests NONE  Final   Culture NO GROWTH  Final   Report Status 10/26/2016 FINAL  Final      Anti-infectives    Start     Dose/Rate Route Frequency Ordered Stop   10/26/16 1000  cefTRIAXone (ROCEPHIN) 1 g in dextrose 5 % 50 mL IVPB     1 g 100 mL/hr over 30 Minutes Intravenous Every 24 hours 10/25/16 0418     10/26/16 1000  azithromycin (ZITHROMAX) 500 mg in dextrose 5 % 250 mL IVPB     500 mg 250 mL/hr over 60 Minutes Intravenous Every 24 hours 10/25/16 0418     10/25/16 0430  cefTRIAXone (ROCEPHIN) 1 g in dextrose 5 % 50 mL IVPB     1 g 100 mL/hr over 30  Minutes Intravenous  Once 10/25/16 0415 10/25/16 0456   10/25/16 0430  azithromycin (ZITHROMAX) 500 mg in dextrose 5 % 250 mL IVPB     500 mg 250 mL/hr over 60 Minutes Intravenous  Once 10/25/16 0415 10/25/16 0526       Radiology Studies: Dg Chest 2 View  Result Date: 10/25/2016 CLINICAL DATA:  Chest pain. EXAM: CHEST  2 VIEW COMPARISON:  Most recent radiograph 08/11/2016 FINDINGS: The heart is normal in size. There is tortuosity of  the thoracic aorta. There patchy bibasilar opacities, left greater than right. Upper lungs are clear. No pleural fluid or pneumothorax. No acute osseous abnormality. IMPRESSION: Patchy bibasilar opacities, left greater than right, suspicious for multifocal pneumonia. Atelectasis felt less likely particularly on the left. Electronically Signed   By: Jeb Levering M.D.   On: 10/25/2016 03:46        Scheduled Meds: . apixaban  5 mg Oral BID  . azithromycin  500 mg Intravenous Q24H  . cefTRIAXone (ROCEPHIN)  IV  1 g Intravenous Q24H  . fluticasone  2 spray Each Nare Daily  . guaiFENesin  600 mg Oral BID  . ipratropium-albuterol  3 mL Nebulization BID  . LORazepam  0-4 mg Oral Q6H   Followed by  . [START ON 10/27/2016] LORazepam  0-4 mg Oral Q12H  . thiamine  100 mg Oral Daily   Or  . thiamine  100 mg Intravenous Daily   Continuous Infusions:   LOS: 0 days    Time spent: 25 min    [place], DO [facility] Pager [phone]  If 7PM-7AM, please contact night-coverage [email or web address] Password [email or web address] 10/26/2016, 1:34 PM

## 2016-10-26 NOTE — Progress Notes (Signed)
Pt seemed much calmer this morning CIWA reflects this, did not give ativan the AM.

## 2016-10-27 LAB — BASIC METABOLIC PANEL
Anion gap: 9 (ref 5–15)
BUN: 9 mg/dL (ref 6–20)
CALCIUM: 8.9 mg/dL (ref 8.9–10.3)
CO2: 21 mmol/L — ABNORMAL LOW (ref 22–32)
CREATININE: 0.88 mg/dL (ref 0.61–1.24)
Chloride: 105 mmol/L (ref 101–111)
GFR calc non Af Amer: >60 mL/min (ref >60)
Glucose, Bld: 82 mg/dL (ref 65–99)
Potassium: 4.2 mmol/L (ref 3.5–5.1)
SODIUM: 135 mmol/L (ref 135–145)

## 2016-10-27 LAB — CBC
HCT: 39.5 % (ref 39.0–52.0)
HEMOGLOBIN: 13.1 g/dL (ref 13.0–17.0)
MCH: 31.7 pg (ref 26.0–34.0)
MCHC: 33.2 g/dL (ref 30.0–36.0)
MCV: 95.6 fL (ref 78.0–100.0)
Platelets: 157 10*3/uL (ref 150–400)
RBC: 4.13 MIL/uL — ABNORMAL LOW (ref 4.22–5.81)
RDW: 14.1 % (ref 11.5–15.5)
WBC: 3.8 10*3/uL — ABNORMAL LOW (ref 4.0–10.5)

## 2016-10-27 MED ORDER — LEVOFLOXACIN 750 MG PO TABS
750.0000 mg | ORAL_TABLET | Freq: Every day | ORAL | Status: DC
  Administered 2016-10-27: 750 mg via ORAL
  Filled 2016-10-27: qty 1

## 2016-10-27 MED ORDER — ALBUTEROL SULFATE (2.5 MG/3ML) 0.083% IN NEBU: 2.5000 mg | INHALATION_SOLUTION | RESPIRATORY_TRACT | Status: DC | PRN

## 2016-10-27 MED ORDER — LEVOFLOXACIN 750 MG PO TABS: 750.0000 mg | ORAL_TABLET | Freq: Every day | ORAL | 0 refills | Status: DC

## 2016-10-27 MED ORDER — KETOROLAC TROMETHAMINE 15 MG/ML IJ SOLN
15.0000 mg | Freq: Once | INTRAMUSCULAR | Status: AC
  Administered 2016-10-27: 15 mg via INTRAVENOUS
  Filled 2016-10-27: qty 1

## 2016-10-27 NOTE — Progress Notes (Signed)
Patient was discharged home by MD order; discharged instructions  review and give to patient with care notes and prescription; IV DIC; patient will be escorted to the car by nurse tech via wheelchair.  

## 2016-10-27 NOTE — Discharge Summary (Signed)
Physician Discharge Summary  [name] [id number] DOB: Jan 16, 1956 DOA: 10/25/2016  PCP: Philis Fendt, MD  Admit date: 10/25/2016 Discharge date: 10/27/2016   Recommendations for Outpatient Follow-Up:   1. counseling for continued cocaine use 2. Referral to cardiology for outpatient stress test   Discharge Diagnosis:   Principal Problem:   Acute respiratory failure with hypoxia ([place]) Active Problems:   Substance abuse   Chest pain   COPD (chronic obstructive pulmonary disease) (HCC)   Hypertension   Depression   Alcohol abuse   CAP (community acquired pneumonia)   Rhinitis, non-allergic   Discharge disposition:  Home  Discharge Condition: Improved.  Diet recommendation: Low sodium, heart healthy.  Wound care: None.   History of Present Illness:   Brent Byrd is a 61 y.o. male  with past medical history significant for myocardial infarction, COPD, cocaine abuse, tobacco abuse, alcohol abuse, hypertension and hyperlipidemia presents with chest pain. Pt states he has chronic cough and rhinitis. Wife I sick contact. Felt well until yesterday around 3 PM. At 3 PM had acute onset of right and left chest pain rated as severe and pressure. Patient took 3 nitroglycerin without relief of pain. EMS was called. EMS gave patient aspirin. Symptoms still bad patient came to the hospital. Chest pain better after more nitroglycerin. Patient denies any productive cough. Patient denies any fever, chills, nausea, vomiting, rash, headache. Patient denies any recent substance abuse other than alcohol and cigarettes. Patient's last drink was yesterday.    Hospital Course by Problem:   CAP/COPD Antibiotic-Rocephin and Zithromax-- change to PO abx Weaned off O2  Hypertension Resume home meds  CP Likely due to above Heart score 6 -Cardio consulted by phone, advised drug cessation and f/u as OP for nuclear stress Per Dr. Marlou Porch EKG unchanged  Chronic Rhinitis Started  on flonase  Hx of DVT Cont Xarelto  Hx of cocaine use + UDS    Medical Consultants:    None.   Discharge Exam:   Vitals:   10/27/16 0059 10/27/16 0505  BP: (!) 157/99 131/87  Pulse: 65 (!) 49  Resp: 20 20  Temp: 98.3 F (36.8 C) 98.2 F (36.8 C)   Vitals:   10/26/16 2010 10/26/16 2134 10/27/16 0059 10/27/16 0505  BP:  132/88 (!) 157/99 131/87  Pulse:  75 65 (!) 49  Resp:  20 20 20   Temp:  98.3 F (36.8 C) 98.3 F (36.8 C) 98.2 F (36.8 C)  TempSrc:  Oral Oral Oral  SpO2: 98% 97% 95% 95%  Weight:      Height:        Gen:  NAD    The results of significant diagnostics from this hospitalization (including imaging, microbiology, ancillary and laboratory) are listed below for reference.     Procedures and Diagnostic Studies:   Dg Chest 2 View  Result Date: 10/25/2016 CLINICAL DATA:  Chest pain. EXAM: CHEST  2 VIEW COMPARISON:  Most recent radiograph 08/11/2016 FINDINGS: The heart is normal in size. There is tortuosity of the thoracic aorta. There patchy bibasilar opacities, left greater than right. Upper lungs are clear. No pleural fluid or pneumothorax. No acute osseous abnormality. IMPRESSION: Patchy bibasilar opacities, left greater than right, suspicious for multifocal pneumonia. Atelectasis felt less likely particularly on the left. Electronically Signed   By: Jeb Levering M.D.   On: 10/25/2016 03:46     Labs:   Basic Metabolic Panel:  Recent Labs Lab 10/25/16 0247 10/25/16 0717 10/27/16 1155  NA 138  --  135  K 3.9  --  4.2  CL 105  --  105  CO2 19*  --  21*  GLUCOSE 87  --  82  BUN 15  --  9  CREATININE 1.03  --  0.88  CALCIUM 9.0  --  8.9  MG  --  1.9  --   PHOS  --  2.6  --    GFR Estimated Creatinine Clearance: 125.8 mL/min (by C-G formula based on SCr of 0.88 mg/dL). Liver Function Tests: No results for input(s): AST, ALT, ALKPHOS, BILITOT, PROT, ALBUMIN in the last 168 hours. No results for input(s): LIPASE, AMYLASE in the  last 168 hours. No results for input(s): AMMONIA in the last 168 hours. Coagulation profile No results for input(s): INR, PROTIME in the last 168 hours.  CBC:  Recent Labs Lab 10/25/16 0247 10/27/16 1155  WBC 5.5 3.8*  HGB 13.5 13.1  HCT 38.1* 39.5  MCV 91.8 95.6  PLT 122* 157   Cardiac Enzymes:  Recent Labs Lab 10/25/16 0717 10/25/16 1138 10/25/16 1623  TROPONINI <0.03 <0.03 <0.03   BNP: Invalid input(s): POCBNP CBG: No results for input(s): GLUCAP in the last 168 hours. D-Dimer No results for input(s): DDIMER in the last 72 hours. Hgb A1c No results for input(s): HGBA1C in the last 72 hours. Lipid Profile No results for input(s): CHOL, HDL, LDLCALC, TRIG, CHOLHDL, LDLDIRECT in the last 72 hours. Thyroid function studies No results for input(s): TSH, T4TOTAL, T3FREE, THYROIDAB in the last 72 hours.  Invalid input(s): FREET3 Anemia work up No results for input(s): VITAMINB12, FOLATE, FERRITIN, TIBC, IRON, RETICCTPCT in the last 72 hours. Microbiology Recent Results (from the past 240 hour(s))  Blood Culture (routine x 2)     Status: None (Preliminary result)   Collection Time: 10/25/16  4:00 AM  Result Value Ref Range Status   Specimen Description BLOOD RIGHT ARM  Final   Special Requests BOTTLES DRAWN AEROBIC AND ANAEROBIC 5CC  Final   Culture NO GROWTH 1 DAY  Final   Report Status PENDING  Incomplete  Blood Culture (routine x 2)     Status: None (Preliminary result)   Collection Time: 10/25/16  4:09 AM  Result Value Ref Range Status   Specimen Description BLOOD RIGHT HAND  Final   Special Requests BOTTLES DRAWN AEROBIC AND ANAEROBIC 5CC  Final   Culture NO GROWTH 1 DAY  Final   Report Status PENDING  Incomplete  Urine culture     Status: None   Collection Time: 10/25/16  9:03 AM  Result Value Ref Range Status   Specimen Description URINE, RANDOM  Final   Special Requests NONE  Final   Culture NO GROWTH  Final   Report Status 10/26/2016 FINAL  Final      Discharge Instructions:   Discharge Instructions    Diet - low sodium heart healthy    Complete by:  As directed    Increase activity slowly    Complete by:  As directed      Allergies as of 10/27/2016      Reactions   Iodine Hives, Other (See Comments)   Omnipaque [iohexol] Other (See Comments)   IV Dye-Sneezing      Medication List    TAKE these medications   apixaban 5 MG Tabs tablet Commonly known as:  ELIQUIS Take 1 tablet (5 mg total) by mouth 2 (two) times daily. What changed:  when to take this   folic acid 1 MG tablet Commonly  known as:  FOLVITE Take 1 tablet (1 mg total) by mouth daily.   levofloxacin 750 MG tablet Commonly known as:  LEVAQUIN Take 1 tablet (750 mg total) by mouth daily.   nitroGLYCERIN 0.4 MG SL tablet Commonly known as:  NITROSTAT Place 0.4 mg under the tongue every 5 (five) minutes as needed for chest pain. Reported on 02/25/2016   thiamine 100 MG tablet Commonly known as:  VITAMIN B-1 Take 1 tablet (100 mg total) by mouth daily.      Follow-up Information    Philis Fendt, MD On 11/03/2016.   Specialty:  Internal Medicine Why:  Appointment is on 11/03/16 at 11:30 Contact information: [place] [address] [id number] [phone]            Time coordinating discharge: 35 min  Signed:  JESSICA U VANN   [facility] 10/27/2016, 1:19 PM

## 2016-10-27 NOTE — Care Management Note (Signed)
Case Management Note  Patient Details  Name: Brent Byrd MRN: [id number] Date of Birth: 1956/07/01  Subjective/Objective:                 Patient admitted form home with resp distress and hypoxia.    Action/Plan:  Will DC to home. No CM needs identified.   Expected Discharge Date:                  Expected Discharge Plan:  Home/Self Care  In-House Referral:     Discharge planning Services  CM Consult  Post Acute Care Choice:    Choice offered to:     DME Arranged:    DME Agency:     HH Arranged:    [place] Agency:     Status of Service:  Completed, signed off  If discussed at [employer] of Stay Meetings, dates discussed:    Additional Comments:  Carles Collet, RN 10/27/2016, 10:20 AM

## 2016-10-30 LAB — CULTURE, BLOOD (ROUTINE X 2)
Culture: NO GROWTH
Culture: NO GROWTH

## 2016-12-11 ENCOUNTER — Emergency Department (HOSPITAL_COMMUNITY)
Admission: EM | Admit: 2016-12-11 | Discharge: 2016-12-11 | Disposition: A | Discharge status: Home / Self Care | Payer: Medicaid Other | Attending: Emergency Medicine | Admitting: Emergency Medicine

## 2016-12-11 ENCOUNTER — Encounter (HOSPITAL_COMMUNITY): Payer: Self-pay | Admitting: *Deleted

## 2016-12-11 ENCOUNTER — Emergency Department (HOSPITAL_COMMUNITY): Payer: Medicaid Other

## 2016-12-11 DIAGNOSIS — I252 Old myocardial infarction: Secondary | ICD-10-CM | POA: Diagnosis not present

## 2016-12-11 DIAGNOSIS — J111 Influenza due to unidentified influenza virus with other respiratory manifestations: Secondary | ICD-10-CM | POA: Insufficient documentation

## 2016-12-11 DIAGNOSIS — J449 Chronic obstructive pulmonary disease, unspecified: Secondary | ICD-10-CM | POA: Insufficient documentation

## 2016-12-11 DIAGNOSIS — J0100 Acute maxillary sinusitis, unspecified: Secondary | ICD-10-CM

## 2016-12-11 DIAGNOSIS — I10 Essential (primary) hypertension: Secondary | ICD-10-CM | POA: Diagnosis not present

## 2016-12-11 DIAGNOSIS — F17210 Nicotine dependence, cigarettes, uncomplicated: Secondary | ICD-10-CM | POA: Diagnosis not present

## 2016-12-11 DIAGNOSIS — Z79899 Other long term (current) drug therapy: Secondary | ICD-10-CM | POA: Insufficient documentation

## 2016-12-11 DIAGNOSIS — R0602 Shortness of breath: Secondary | ICD-10-CM | POA: Diagnosis present

## 2016-12-11 DIAGNOSIS — Z8673 Personal history of transient ischemic attack (TIA), and cerebral infarction without residual deficits: Secondary | ICD-10-CM | POA: Insufficient documentation

## 2016-12-11 LAB — COMPREHENSIVE METABOLIC PANEL
ALBUMIN: 3.4 g/dL — AB (ref 3.5–5.0)
ALK PHOS: 45 U/L (ref 38–126)
ALT: 22 U/L (ref 17–63)
ANION GAP: 8 (ref 5–15)
AST: 26 U/L (ref 15–41)
BILIRUBIN TOTAL: 0.8 mg/dL (ref 0.3–1.2)
BUN: 8 mg/dL (ref 6–20)
CALCIUM: 8.5 mg/dL — AB (ref 8.9–10.3)
CO2: 21 mmol/L — ABNORMAL LOW (ref 22–32)
Chloride: 108 mmol/L (ref 101–111)
Creatinine, Ser: 1.03 mg/dL (ref 0.61–1.24)
GFR calc Af Amer: >60 mL/min (ref >60)
GLUCOSE: 120 mg/dL — AB (ref 65–99)
Potassium: 3.7 mmol/L (ref 3.5–5.1)
Sodium: 137 mmol/L (ref 135–145)
TOTAL PROTEIN: 6.6 g/dL (ref 6.5–8.1)

## 2016-12-11 LAB — CBC WITH DIFFERENTIAL/PLATELET
BASOS ABS: 0 10*3/uL (ref 0.0–0.1)
BASOS PCT: 0 %
Eosinophils Absolute: 0.1 10*3/uL (ref 0.0–0.7)
Eosinophils Relative: 2 %
HEMATOCRIT: 38.2 % — AB (ref 39.0–52.0)
Hemoglobin: 12.7 g/dL — ABNORMAL LOW (ref 13.0–17.0)
Lymphocytes Relative: 28 %
Lymphs Abs: 1.2 10*3/uL (ref 0.7–4.0)
MCH: 31.5 pg (ref 26.0–34.0)
MCHC: 33.2 g/dL (ref 30.0–36.0)
MCV: 94.8 fL (ref 78.0–100.0)
MONO ABS: 0.6 10*3/uL (ref 0.1–1.0)
Monocytes Relative: 14 %
NEUTROS ABS: 2.4 10*3/uL (ref 1.7–7.7)
Neutrophils Relative %: 56 %
PLATELETS: 149 10*3/uL — AB (ref 150–400)
RBC: 4.03 MIL/uL — ABNORMAL LOW (ref 4.22–5.81)
RDW: 14.1 % (ref 11.5–15.5)
WBC: 4.2 10*3/uL (ref 4.0–10.5)

## 2016-12-11 MED ORDER — AMOXICILLIN-POT CLAVULANATE 875-125 MG PO TABS: 1.0000 | ORAL_TABLET | Freq: Two times a day (BID) | ORAL | 0 refills | Status: AC

## 2016-12-11 MED ORDER — DEXAMETHASONE 4 MG PO TABS
10.0000 mg | ORAL_TABLET | Freq: Once | ORAL | Status: AC
  Administered 2016-12-11: 10 mg via ORAL
  Filled 2016-12-11: qty 3

## 2016-12-11 MED ORDER — AMOXICILLIN-POT CLAVULANATE 875-125 MG PO TABS: 1.0000 | ORAL_TABLET | Freq: Two times a day (BID) | ORAL | 0 refills | Status: DC

## 2016-12-11 NOTE — Discharge Instructions (Signed)
Mr. Brent Byrd,  Please take your antibiotics two times a day for 5 days. Please get rest and drink plenty of fluids Please follow up with your primary care doctor after discharge for follow up on your flu and sinusitis

## 2016-12-11 NOTE — ED Provider Notes (Signed)
[facility] DEPT Provider Note   CSN: [id number] Arrival date & time: 12/11/16  0709     History   Chief Complaint Chief Complaint  Patient presents with  . Shortness of Breath    HPI Brent Byrd is a 61 y.o. male. With history of DVT on Eliquis and tobacco/alcohol/drug abuse that presents to the ED for cough. He reports that the cough started Sunday and is productive with green sputum. He reports associated symptoms of headache, sinus pressure, sore throat, generalized body aches and runny nose. He states that his left side in his back hurts when he coughs. He denies fever, chest pain, pleuritic chest pain, shortness of breath, or calf pain.  He has tried cough drops and theraflu with little benefit.     HPI  Past Medical History:  Diagnosis Date  . Alcohol abuse   . Arthritis    Left knee, lower back  . Chest pain    hx  . COPD (chronic obstructive pulmonary disease) ([place])    "[place] said I don't have this" (10/26/2016)  . Depression    no meds  . DVT (deep venous thrombosis) ([place]) 2008   Right leg- txed with coumadin   . DVT (deep venous thrombosis) ([place]) 2010; 2017   RLE; RLE  . Gastroesophageal reflux disease 2014  . Hypertension     per patiente-controlled, was taken off BP med  . MI, old ~ 2007  . Stroke ([place]) 01/2012   denies residual on 10/26/2016  . Substance abuse    Alcohol dependence Hx  . Tobacco abuse     Patient Active Problem List   Diagnosis Date Noted  . CAP (community acquired pneumonia) 10/25/2016  . Acute respiratory failure with hypoxia ([place]) 10/25/2016  . Rhinitis, non-allergic 10/25/2016  . SOB (shortness of breath) 04/13/2016  . Hypotension 04/13/2016  . Acute respiratory distress 04/13/2016  . Substance abuse   . Chest pain   . COPD (chronic obstructive pulmonary disease) ([place])   . Gastroesophageal reflux disease   . Hypertension   . Depression   . Tobacco abuse   . Alcohol abuse   . Cigarette smoker 05/18/2015  .  Asthmatic bronchitis with acute exacerbation 05/17/2015    Past Surgical History:  Procedure Laterality Date  . BACK SURGERY    . GANGLION CYST EXCISION Left   . HAMMER TOE SURGERY Left 07/2012   2nd toe/08/05/2012  . LUMBAR DISC SURGERY  06/2007   Herniated disc/notes 02/25/2011  . WISDOM TOOTH EXTRACTION         Home Medications    Prior to Admission medications   Medication Sig Start Date End Date Taking? Authorizing Provider  amoxicillin-clavulanate (AUGMENTIN) 875-125 MG tablet Take 1 tablet by mouth 2 (two) times daily. 12/11/16 12/16/16  Valinda Party, DO  apixaban (ELIQUIS) 5 MG TABS tablet Take 1 tablet (5 mg total) by mouth 2 (two) times daily. Patient taking differently: Take 5 mg by mouth daily.  04/21/16   Thurnell Lose, MD  folic acid (FOLVITE) 1 MG tablet Take 1 tablet (1 mg total) by mouth daily. 04/13/16   Thurnell Lose, MD  levofloxacin (LEVAQUIN) 750 MG tablet Take 1 tablet (750 mg total) by mouth daily. 10/27/16   Geradine Girt, DO  nitroGLYCERIN (NITROSTAT) 0.4 MG SL tablet Place 0.4 mg under the tongue every 5 (five) minutes as needed for chest pain. Reported on 02/25/2016    Historical Provider, MD  thiamine (VITAMIN B-1) 100 MG tablet Take  1 tablet (100 mg total) by mouth daily. 04/13/16   Thurnell Lose, MD    Family History Family History  Problem Relation Age of Onset  . Asthma Mother   . Cancer Father     Lung  . Cancer Sister   . Thrombosis Sister   . Diabetes Sister   . Colon cancer Neg Hx   . Rectal cancer Neg Hx   . Stomach cancer Neg Hx   . Esophageal cancer Neg Hx     Social History Social History  Substance Use Topics  . Smoking status: Current Every Day Smoker    Packs/day: 0.50    Years: 45.00    Types: Cigarettes  . Smokeless tobacco: Never Used  . Alcohol use 28.2 oz/week    47 Cans of beer per week     Comment: hx of heavy drinking; "2, 40oz beers/day" (10/26/2016)     Allergies   Iodine and Omnipaque  [iohexol]   Review of Systems Review of Systems  Constitutional: Positive for chills.  HENT: Positive for congestion and sore throat.   Respiratory: Positive for cough. Negative for shortness of breath.   Cardiovascular: Negative for chest pain and leg swelling.  Gastrointestinal: Negative for vomiting.     Physical Exam Updated Vital Signs BP 110/69   Pulse 64   Temp 98.6 F (37 C) (Oral)   Resp 18   SpO2 95%   Physical Exam  HENT:  Sinus tenderness on frontal and maxillary sinus    Neck:  Cervical lymphadenopathy  Cardiovascular: Normal rate, regular rhythm and normal heart sounds.  Exam reveals no gallop and no friction rub.   No murmur heard. Pulmonary/Chest: Effort normal.  Scattered wheezing and rhonchi throughout all lung fields   Abdominal: Soft. He exhibits no distension. There is no tenderness.  Musculoskeletal: He exhibits no edema.  Negative Homans sign  Skin: Skin is warm and dry.     ED Treatments / Results  Labs (all labs ordered are listed, but only abnormal results are displayed) Labs Reviewed  COMPREHENSIVE METABOLIC PANEL - Abnormal; Notable for the following:       Result Value   CO2 21 (*)    Glucose, Bld 120 (*)    Calcium 8.5 (*)    Albumin 3.4 (*)    All other components within normal limits  CBC WITH DIFFERENTIAL/PLATELET - Abnormal; Notable for the following:    RBC 4.03 (*)    Hemoglobin 12.7 (*)    HCT 38.2 (*)    Platelets 149 (*)    All other components within normal limits    EKG  EKG Interpretation  Date/Time:  Friday December 11 2016 07:21:43 EST Ventricular Rate:  83 PR Interval:    QRS Duration: 103 QT Interval:  360 QTC Calculation: 423 R Axis:   0 Text Interpretation:  Sinus rhythm Nonspecific repol abnormality, lateral leads No significant change since last tracing Confirmed by Christy Gentles  MD, [place] ([id number]) on 12/11/2016 7:43:21 AM       Radiology Dg Chest 2 View  Result Date: 12/11/2016 CLINICAL DATA:   Shortness of breath, chills, cough EXAM: CHEST  2 VIEW COMPARISON:  10/25/2016 FINDINGS: Stable heart size and vascularity. Slightly decreased lung volumes with slight nonspecific interstitial prominence appearing chronic. Improved bibasilar aeration. No current focal pneumonia, collapse or consolidation. No superimposed edema, effusion or pneumothorax. Trachea is midline. Atherosclerosis of the aorta. IMPRESSION: Stable mild interstitial prominence without superimposed acute process. Improved bibasilar aeration. Electronically Signed  By: Eugenie Filler M.D.   On: 12/11/2016 07:55    Procedures Procedures (including critical care time)  Medications Ordered in ED Medications  dexamethasone (DECADRON) tablet 10 mg (10 mg Oral Given 12/11/16 0825)     Initial Impression / Assessment and Plan / ED Course  I have reviewed the triage vital signs and the nursing notes.  Pertinent labs & imaging results that were available during my care of the patient were reviewed by me and considered in my medical decision making (see chart for details).   Patient presents with flu like symptoms.  Since symptoms started 5 days ago he may not benefit from tamiflu.  Chest X-ray shows no focal pneumonia.  Low risk for PE, not hypoxic or tachycardic and no calf tenderness on exam. He does have facial and maxillary tenderness with sinus drainage that is progressively getting worse.  Will treat for acute bacterial sinusitis with augmentin and have patient follow up with PCP after discharge.     Final Clinical Impressions(s) / ED Diagnoses   Final diagnoses:  Influenza  Acute maxillary sinusitis, recurrence not specified    New Prescriptions Discharge Medication List as of 12/11/2016  9:53 AM       Valinda Party, DO 12/12/16 [place], DO 12/12/16 1509

## 2016-12-11 NOTE — ED Triage Notes (Signed)
Patient presents to via [facility] states he was hospitalized in Jan. With Pneumonia was sent home with antibiotics, followed up atg PCP wasn't better was put on 2 nd round of antibiotics c/o generalized body aches,

## 2017-03-10 ENCOUNTER — Encounter (HOSPITAL_COMMUNITY): Payer: Self-pay | Admitting: Emergency Medicine

## 2017-03-10 ENCOUNTER — Emergency Department (HOSPITAL_COMMUNITY): Payer: Medicaid Other

## 2017-03-10 ENCOUNTER — Emergency Department (HOSPITAL_COMMUNITY)
Admission: EM | Admit: 2017-03-10 | Discharge: 2017-03-10 | Disposition: A | Discharge status: Home / Self Care | Payer: Medicaid Other | Attending: Emergency Medicine | Admitting: Emergency Medicine

## 2017-03-10 DIAGNOSIS — W010XXA Fall on same level from slipping, tripping and stumbling without subsequent striking against object, initial encounter: Secondary | ICD-10-CM | POA: Diagnosis not present

## 2017-03-10 DIAGNOSIS — Z7901 Long term (current) use of anticoagulants: Secondary | ICD-10-CM | POA: Insufficient documentation

## 2017-03-10 DIAGNOSIS — F17210 Nicotine dependence, cigarettes, uncomplicated: Secondary | ICD-10-CM | POA: Insufficient documentation

## 2017-03-10 DIAGNOSIS — R930 Abnormal findings on diagnostic imaging of skull and head, not elsewhere classified: Secondary | ICD-10-CM | POA: Diagnosis not present

## 2017-03-10 DIAGNOSIS — Y939 Activity, unspecified: Secondary | ICD-10-CM | POA: Diagnosis not present

## 2017-03-10 DIAGNOSIS — Y999 Unspecified external cause status: Secondary | ICD-10-CM | POA: Insufficient documentation

## 2017-03-10 DIAGNOSIS — S40011A Contusion of right shoulder, initial encounter: Secondary | ICD-10-CM | POA: Insufficient documentation

## 2017-03-10 DIAGNOSIS — Y92009 Unspecified place in unspecified non-institutional (private) residence as the place of occurrence of the external cause: Secondary | ICD-10-CM | POA: Insufficient documentation

## 2017-03-10 DIAGNOSIS — S4991XA Unspecified injury of right shoulder and upper arm, initial encounter: Secondary | ICD-10-CM | POA: Diagnosis present

## 2017-03-10 DIAGNOSIS — S20229A Contusion of unspecified back wall of thorax, initial encounter: Secondary | ICD-10-CM | POA: Insufficient documentation

## 2017-03-10 MED ORDER — OXYCODONE-ACETAMINOPHEN 5-325 MG PO TABS
1.0000 | ORAL_TABLET | Freq: Once | ORAL | Status: AC
  Administered 2017-03-10: 1 via ORAL
  Filled 2017-03-10: qty 1

## 2017-03-10 MED ORDER — TRAMADOL HCL 50 MG PO TABS: 50.0000 mg | ORAL_TABLET | Freq: Four times a day (QID) | ORAL | 0 refills | Status: DC | PRN

## 2017-03-10 NOTE — ED Provider Notes (Signed)
[facility] DEPT Provider Note   CSN: [id number] Arrival date & time: 03/10/17  0351     History   Chief Complaint Chief Complaint  Patient presents with  . Shoulder Injury  . Fall    HPI Brent Byrd is a 61 y.o. male.  The history is provided by the patient.  He tripped at home and fell injuring his right shoulder. He rates pain at 8/10. He was unable to sleep. He tried to treated with a topical pain patch without success. He denies head injury or loss of consciousness. He denies other injury.  Past Medical History:  Diagnosis Date  . Alcohol abuse   . Arthritis    Left knee, lower back  . Chest pain    hx  . COPD (chronic obstructive pulmonary disease) ([place])    "[place] said I don't have this" (10/26/2016)  . Depression    no meds  . DVT (deep venous thrombosis) ([place]) 2008   Right leg- txed with coumadin   . DVT (deep venous thrombosis) ([place]) 2010; 2017   RLE; RLE  . Gastroesophageal reflux disease 2014  . Hypertension     per patiente-controlled, was taken off BP med  . MI, old ~ 2007  . Stroke ([place]) 01/2012   denies residual on 10/26/2016  . Substance abuse    Alcohol dependence Hx  . Tobacco abuse     Patient Active Problem List   Diagnosis Date Noted  . CAP (community acquired pneumonia) 10/25/2016  . Acute respiratory failure with hypoxia ([place]) 10/25/2016  . Rhinitis, non-allergic 10/25/2016  . SOB (shortness of breath) 04/13/2016  . Hypotension 04/13/2016  . Acute respiratory distress 04/13/2016  . Substance abuse   . Chest pain   . COPD (chronic obstructive pulmonary disease) ([place])   . Gastroesophageal reflux disease   . Hypertension   . Depression   . Tobacco abuse   . Alcohol abuse   . Cigarette smoker 05/18/2015  . Asthmatic bronchitis with acute exacerbation 05/17/2015    Past Surgical History:  Procedure Laterality Date  . BACK SURGERY    . GANGLION CYST EXCISION Left   . HAMMER TOE SURGERY Left 07/2012   2nd toe/08/05/2012    . LUMBAR DISC SURGERY  06/2007   Herniated disc/notes 02/25/2011  . WISDOM TOOTH EXTRACTION         Home Medications    Prior to Admission medications   Medication Sig Start Date End Date Taking? Authorizing Provider  apixaban (ELIQUIS) 5 MG TABS tablet Take 1 tablet (5 mg total) by mouth 2 (two) times daily. Patient taking differently: Take 5 mg by mouth daily.  04/21/16   Thurnell Lose, MD  folic acid (FOLVITE) 1 MG tablet Take 1 tablet (1 mg total) by mouth daily. 04/13/16   Thurnell Lose, MD  levofloxacin (LEVAQUIN) 750 MG tablet Take 1 tablet (750 mg total) by mouth daily. 10/27/16   Geradine Girt, DO  nitroGLYCERIN (NITROSTAT) 0.4 MG SL tablet Place 0.4 mg under the tongue every 5 (five) minutes as needed for chest pain. Reported on 02/25/2016    [provider]  thiamine (VITAMIN B-1) 100 MG tablet Take 1 tablet (100 mg total) by mouth daily. 04/13/16   Thurnell Lose, MD    Family History Family History  Problem Relation Age of Onset  . Asthma Mother   . Cancer Father        Lung  . Cancer Sister   . Thrombosis Sister   .  Diabetes Sister   . Colon cancer Neg Hx   . Rectal cancer Neg Hx   . Stomach cancer Neg Hx   . Esophageal cancer Neg Hx     Social History Social History  Substance Use Topics  . Smoking status: Current Every Day Smoker    Packs/day: 0.50    Years: 45.00    Types: Cigarettes  . Smokeless tobacco: Never Used  . Alcohol use 28.2 oz/week    47 Cans of beer per week     Comment: hx of heavy drinking; "2, 40oz beers/day" (10/26/2016)     Allergies   Iodine and Omnipaque [iohexol]   Review of Systems Review of Systems  All other systems reviewed and are negative.    Physical Exam Updated Vital Signs BP 98/78 (BP Location: Left Arm)   Pulse (!) 116   Temp 97.6 F (36.4 C) (Oral)   Resp 19   SpO2 91%   Physical Exam  Nursing note and vitals reviewed.  61 year old male, resting comfortably and in no acute distress.  Vital signs are significant for tachycardia. Oxygen saturation is 91%, which is hypoxic by my interpretation. Head is normocephalic and atraumatic. PERRLA, EOMI. Oropharynx is clear. Neck is nontender and supple without adenopathy or JVD. Back is mildly moderately tender in the upper thoracic region. There is no CVA tenderness. Lungs are clear without rales, wheezes, or rhonchi. Chest is nontender. Heart has regular rate and rhythm without murmur. Abdomen is soft, flat, nontender without masses or hepatosplenomegaly and peristalsis is normoactive. Extremities: There is moderate tenderness diffusely around the right shoulder but there is no swelling or deformity. Full passive range of motion is present. Distal neurovascular exam is intact. No other extremity injury is seen. Skin is warm and dry without rash. Neurologic: Mental status is normal, cranial nerves are intact, there are no motor or sensory deficits.  ED Treatments / Results   Radiology Dg Thoracic Spine W/swimmers  Result Date: 03/10/2017 CLINICAL DATA:  Fall while walking downstairs. EXAM: THORACIC SPINE - 3 VIEWS COMPARISON:  Chest radiograph 12/11/2016 FINDINGS: There is no evidence of thoracic spine fracture. Alignment is normal. No other significant bone abnormalities are identified. IMPRESSION: Negative. Electronically Signed   By: Ulyses Jarred M.D.   On: 03/10/2017 05:15   Dg Shoulder Right  Result Date: 03/10/2017 CLINICAL DATA:  Right shoulder pain after fall. Fall on steps landing on right shoulder. EXAM: RIGHT SHOULDER - 2+ VIEW COMPARISON:  None. FINDINGS: There is no evidence of fracture or dislocation. There degenerative change of the acromioclavicular and to lesser extent glenohumeral joint. Soft tissues are unremarkable. No fracture of the included ribs. IMPRESSION: No fracture or dislocation of the right shoulder. Acromioclavicular and mild glenohumeral osteoarthritis. Electronically Signed   By: Jeb Levering M.D.    On: 03/10/2017 04:33   Ct Head Wo Contrast  Result Date: 03/10/2017 CLINICAL DATA:  Fall EXAM: CT HEAD WITHOUT CONTRAST TECHNIQUE: Contiguous axial images were obtained from the base of the skull through the vertex without intravenous contrast. COMPARISON:  Head CT 10/15/2015 FINDINGS: Brain: No mass lesion, intraparenchymal hemorrhage or extra-axial collection. No evidence of acute cortical infarct. Brain parenchyma and CSF-containing spaces are normal for age. Vascular: No hyperdense vessel or unexpected calcification. Skull: Normal visualized skull base, calvarium and extracranial soft tissues. Sinuses/Orbits: No sinus fluid levels or advanced mucosal thickening. No mastoid effusion. Normal orbits. IMPRESSION: Normal head CT for age. Electronically Signed   By: Cletus Gash.D.  On: 03/10/2017 06:05    Procedures Procedures (including critical care time)  Medications Ordered in ED Medications  oxyCODONE-acetaminophen (PERCOCET/ROXICET) 5-325 MG per tablet 1 tablet (not administered)     Initial Impression / Assessment and Plan / ED Course  I have reviewed the triage vital signs and the nursing notes.  Pertinent labs & imaging results that were available during my care of the patient were reviewed by me and considered in my medical decision making (see chart for details).  Fall with contusion of the right shoulder and also tenderness in the upper thoracic spine. X-ray show no evidence of fracture the shoulder. He is being sent back for x-rays of thoracic spine. Old records are reviewed, and he has no relevant past visits.Also, he is anticoagulated on apixaban, so CT of the head will be obtained to rule out intracranial bleeding.  X-rays and CT scans are negative for acute injury. He is discharged with prescription for tramadol. He states that he has been taking naproxen at home. Advised that he should not do that while taking systemic anticoagulant.  Final Clinical Impressions(s) / ED  Diagnoses   Final diagnoses:  Fall from slip, trip, or stumble, initial encounter  Contusion of multiple sites of right shoulder, initial encounter  Contusion of upper back, initial encounter    New Prescriptions New Prescriptions   TRAMADOL (ULTRAM) 50 MG TABLET    Take 1 tablet (50 mg total) by mouth every 6 (six) hours as needed.     Delora Fuel, MD 02/77/41 [phone]

## 2017-03-10 NOTE — Discharge Instructions (Signed)
You may take acetaminophen for less severe pain. Do not take ibuprofen or naproxen because they can cause bleeding when taken along with apixaban.

## 2017-03-10 NOTE — ED Triage Notes (Signed)
Patient missed some steps when he was going down a flight of stairs, now with right shoulder pain.  Patient states that he did not hit his shoulder on anything, but the pain was too much for him to sleep tonight.

## 2017-10-05 ENCOUNTER — Emergency Department (HOSPITAL_COMMUNITY)
Admission: EM | Admit: 2017-10-05 | Discharge: 2017-10-05 | Disposition: A | Discharge status: Home / Self Care | Payer: Medicaid Other | Attending: Emergency Medicine | Admitting: Emergency Medicine

## 2017-10-05 ENCOUNTER — Other Ambulatory Visit: Payer: Self-pay

## 2017-10-05 ENCOUNTER — Encounter (HOSPITAL_COMMUNITY): Payer: Self-pay | Admitting: Emergency Medicine

## 2017-10-05 ENCOUNTER — Emergency Department (HOSPITAL_COMMUNITY): Payer: Medicaid Other

## 2017-10-05 DIAGNOSIS — Z7901 Long term (current) use of anticoagulants: Secondary | ICD-10-CM | POA: Insufficient documentation

## 2017-10-05 DIAGNOSIS — R202 Paresthesia of skin: Secondary | ICD-10-CM | POA: Diagnosis present

## 2017-10-05 DIAGNOSIS — F10120 Alcohol abuse with intoxication, uncomplicated: Secondary | ICD-10-CM | POA: Diagnosis not present

## 2017-10-05 DIAGNOSIS — Z79899 Other long term (current) drug therapy: Secondary | ICD-10-CM | POA: Insufficient documentation

## 2017-10-05 DIAGNOSIS — Z87891 Personal history of nicotine dependence: Secondary | ICD-10-CM | POA: Insufficient documentation

## 2017-10-05 DIAGNOSIS — J449 Chronic obstructive pulmonary disease, unspecified: Secondary | ICD-10-CM | POA: Diagnosis not present

## 2017-10-05 DIAGNOSIS — Z8673 Personal history of transient ischemic attack (TIA), and cerebral infarction without residual deficits: Secondary | ICD-10-CM | POA: Diagnosis not present

## 2017-10-05 DIAGNOSIS — I10 Essential (primary) hypertension: Secondary | ICD-10-CM | POA: Diagnosis not present

## 2017-10-05 DIAGNOSIS — I252 Old myocardial infarction: Secondary | ICD-10-CM | POA: Diagnosis not present

## 2017-10-05 DIAGNOSIS — F329 Major depressive disorder, single episode, unspecified: Secondary | ICD-10-CM | POA: Diagnosis not present

## 2017-10-05 DIAGNOSIS — F10920 Alcohol use, unspecified with intoxication, uncomplicated: Secondary | ICD-10-CM

## 2017-10-05 LAB — COMPREHENSIVE METABOLIC PANEL
ALBUMIN: 3.8 g/dL (ref 3.5–5.0)
ALK PHOS: 56 U/L (ref 38–126)
ALT: 31 U/L (ref 17–63)
AST: 28 U/L (ref 15–41)
Anion gap: 10 (ref 5–15)
BUN: 14 mg/dL (ref 6–20)
CO2: 20 mmol/L — AB (ref 22–32)
CREATININE: 0.87 mg/dL (ref 0.61–1.24)
Calcium: 9 mg/dL (ref 8.9–10.3)
Chloride: 108 mmol/L (ref 101–111)
GFR calc Af Amer: >60 mL/min (ref >60)
GFR calc non Af Amer: >60 mL/min (ref >60)
GLUCOSE: 90 mg/dL (ref 65–99)
Potassium: 4 mmol/L (ref 3.5–5.1)
SODIUM: 138 mmol/L (ref 135–145)
Total Bilirubin: 0.4 mg/dL (ref 0.3–1.2)
Total Protein: 7.2 g/dL (ref 6.5–8.1)

## 2017-10-05 LAB — CBC WITH DIFFERENTIAL/PLATELET
Basophils Absolute: 0 10*3/uL (ref 0.0–0.1)
Basophils Relative: 1 %
EOS ABS: 0.2 10*3/uL (ref 0.0–0.7)
EOS PCT: 4 %
HCT: 40.7 % (ref 39.0–52.0)
HEMOGLOBIN: 14 g/dL (ref 13.0–17.0)
LYMPHS ABS: 2.1 10*3/uL (ref 0.7–4.0)
Lymphocytes Relative: 44 %
MCH: 32.9 pg (ref 26.0–34.0)
MCHC: 34.4 g/dL (ref 30.0–36.0)
MCV: 95.5 fL (ref 78.0–100.0)
MONOS PCT: 7 %
Monocytes Absolute: 0.3 10*3/uL (ref 0.1–1.0)
NEUTROS PCT: 44 %
Neutro Abs: 2.2 10*3/uL (ref 1.7–7.7)
Platelets: 145 10*3/uL — ABNORMAL LOW (ref 150–400)
RBC: 4.26 MIL/uL (ref 4.22–5.81)
RDW: 13.8 % (ref 11.5–15.5)
WBC: 4.8 10*3/uL (ref 4.0–10.5)

## 2017-10-05 LAB — RAPID URINE DRUG SCREEN, HOSP PERFORMED
Amphetamines: NOT DETECTED
Barbiturates: NOT DETECTED
Benzodiazepines: NOT DETECTED
Cocaine: POSITIVE — AB
OPIATES: NOT DETECTED
Tetrahydrocannabinol: NOT DETECTED

## 2017-10-05 LAB — ETHANOL: Alcohol, Ethyl (B): 175 mg/dL — ABNORMAL HIGH (ref <10)

## 2017-10-05 LAB — TROPONIN I: Troponin I: <0.03 ng/mL (ref <0.03)

## 2017-10-05 NOTE — ED Triage Notes (Signed)
Per [facility], Pt reports numbness to L arm and immobility to L arm starting 2 weeks ago. Pt reports numbness to L leg starting 3 hours ago. Pt reports cocaine use last night, and drinking 2- 40 oz beers today. Pt denies pain other than chronic back pain. Pt had an incontinent episode and was not aware. Pt alert and oriented x4. VSS.

## 2017-10-05 NOTE — ED Notes (Addendum)
MD Messick made aware pt's symptoms. MD does not want to call [facility] Stroke.

## 2017-10-05 NOTE — ED Notes (Signed)
Patient transported to CT 

## 2017-10-05 NOTE — ED Provider Notes (Signed)
[facility] EMERGENCY DEPARTMENT Provider Note   CSN: [id number] Arrival date & time: 10/05/17  1827     History   Chief Complaint Chief Complaint  Patient presents with  . Numbness    to L arm, L leg, R shoulder    HPI Brent Byrd is a 61 y.o. male.  61 year old male with a long-standing history of alcohol abuse, COPD, and hypertension presents with complaint of feeling "numb and tingly" to the left and right arms.  Patient reports that this is been a long-standing problem.  He reports that symptoms started at least 2-3 weeks ago.  He also reports numbness to the left leg that started sometime the last several days.  Patient appears to be intoxicated.  He reports drinking alcohol today.  He also reports using cocaine yesterday.  Patient denies associated weakness.  He is alert.  He denies any pain.  He denies visual change, nausea, vomiting, chest pain, or shortness of breath.   The history is provided by the patient.  Neurologic Problem  This is a chronic problem. The current episode started more than 1 week ago. The problem occurs constantly. The problem has not changed since onset.Pertinent negatives include no chest pain, no abdominal pain, no headaches and no shortness of breath. Nothing aggravates the symptoms. Nothing relieves the symptoms. He has tried nothing for the symptoms.    Past Medical History:  Diagnosis Date  . Alcohol abuse   . Arthritis    Left knee, lower back  . Chest pain    hx  . COPD (chronic obstructive pulmonary disease) ([place])    "[place] said I don't have this" (10/26/2016)  . Depression    no meds  . DVT (deep venous thrombosis) ([place]) 2008   Right leg- txed with coumadin   . DVT (deep venous thrombosis) ([place]) 2010; 2017   RLE; RLE  . Gastroesophageal reflux disease 2014  . Hypertension     per patiente-controlled, was taken off BP med  . MI, old ~ 2007  . Stroke ([place]) 01/2012   denies residual on 10/26/2016  .  Substance abuse ([place])    Alcohol dependence Hx  . Tobacco abuse     Patient Active Problem List   Diagnosis Date Noted  . CAP (community acquired pneumonia) 10/25/2016  . Acute respiratory failure with hypoxia ([place]) 10/25/2016  . Rhinitis, non-allergic 10/25/2016  . SOB (shortness of breath) 04/13/2016  . Hypotension 04/13/2016  . Acute respiratory distress 04/13/2016  . Substance abuse ([place])   . Chest pain   . COPD (chronic obstructive pulmonary disease) ([place])   . Gastroesophageal reflux disease   . Hypertension   . Depression   . Tobacco abuse   . Alcohol abuse   . Cigarette smoker 05/18/2015  . Asthmatic bronchitis with acute exacerbation 05/17/2015    Past Surgical History:  Procedure Laterality Date  . BACK SURGERY    . GANGLION CYST EXCISION Left   . HAMMER TOE SURGERY Left 07/2012   2nd toe/08/05/2012  . LUMBAR DISC SURGERY  06/2007   Herniated disc/notes 02/25/2011  . WISDOM TOOTH EXTRACTION         Home Medications    Prior to Admission medications   Medication Sig Start Date End Date Taking? Authorizing Provider  apixaban (ELIQUIS) 5 MG TABS tablet Take 1 tablet (5 mg total) by mouth 2 (two) times daily. Patient taking differently: Take 5 mg by mouth daily.  04/21/16  Yes Thurnell Lose, MD  folic acid (FOLVITE) 1 MG tablet Take 1 tablet (1 mg total) by mouth daily. 04/13/16  Yes Thurnell Lose, MD  nitroGLYCERIN (NITROSTAT) 0.4 MG SL tablet Place 0.4 mg under the tongue every 5 (five) minutes as needed for chest pain. Reported on 02/25/2016   Yes [provider]  thiamine (VITAMIN B-1) 100 MG tablet Take 1 tablet (100 mg total) by mouth daily. 04/13/16  Yes Thurnell Lose, MD  traMADol (ULTRAM) 50 MG tablet Take 1 tablet (50 mg total) by mouth every 6 (six) hours as needed. 1/74/94  Yes Delora Fuel, MD    Family History Family History  Problem Relation Age of Onset  . Asthma Mother   . Cancer Father        Lung  . Cancer Sister   .  Thrombosis Sister   . Diabetes Sister   . Colon cancer Neg Hx   . Rectal cancer Neg Hx   . Stomach cancer Neg Hx   . Esophageal cancer Neg Hx     Social History Social History   Tobacco Use  . Smoking status: Current Every Day Smoker    Packs/day: 0.50    Years: 45.00    Pack years: 22.50    Types: Cigarettes  . Smokeless tobacco: Never Used  Substance Use Topics  . Alcohol use: Yes    Alcohol/week: 28.2 oz    Types: 47 Cans of beer per week    Comment: hx of heavy drinking; "2, 40oz beers/day" (10/26/2016)  . Drug use: Yes    Types: "Crack" cocaine, Cocaine    Comment: 10/26/2016 "crack once q 2-3 months"     Allergies   Iodine and Omnipaque [iohexol]   Review of Systems Review of Systems  Respiratory: Negative for shortness of breath.   Cardiovascular: Negative for chest pain.  Gastrointestinal: Negative for abdominal pain.  Neurological: Negative for headaches.  All other systems reviewed and are negative.    Physical Exam Updated Vital Signs BP 117/86 (BP Location: Right Arm)   Pulse 79   Temp 97.7 F (36.5 C) (Oral)   Resp 19   Ht 6\' 4"  (1.93 m)   Wt 122.5 kg (270 lb)   SpO2 95%   BMI 32.87 kg/m   Physical Exam  Constitutional: He is oriented to person, place, and time. He appears well-developed and well-nourished. No distress.  Appears intoxicated upon arrival to ED room for evaluation  HENT:  Head: Normocephalic and atraumatic.  Mouth/Throat: Oropharynx is clear and moist.  Eyes: Conjunctivae and EOM are normal. Pupils are equal, round, and reactive to light.  Neck: Normal range of motion. Neck supple.  Cardiovascular: Normal rate, regular rhythm and normal heart sounds.  Pulmonary/Chest: Effort normal and breath sounds normal. No respiratory distress.  Abdominal: Soft. He exhibits no distension. There is no tenderness.  Musculoskeletal: Normal range of motion. He exhibits no edema or deformity.  Neurological: He is alert and oriented to person,  place, and time.  Skin: Skin is warm and dry.  Psychiatric: He has a normal mood and affect.  Nursing note and vitals reviewed.    ED Treatments / Results  Labs (all labs ordered are listed, but only abnormal results are displayed) Labs Reviewed  ETHANOL - Abnormal; Notable for the following components:      Result Value   Alcohol, Ethyl (B) 175 (*)    All other components within normal limits  COMPREHENSIVE METABOLIC PANEL - Abnormal; Notable for the following components:  CO2 20 (*)    All other components within normal limits  CBC WITH DIFFERENTIAL/PLATELET - Abnormal; Notable for the following components:   Platelets 145 (*)    All other components within normal limits  RAPID URINE DRUG SCREEN, HOSP PERFORMED - Abnormal; Notable for the following components:   Cocaine POSITIVE (*)    All other components within normal limits  TROPONIN I    EKG  EKG Interpretation  Date/Time:  Tuesday October 05 2017 18:37:28 EST Ventricular Rate:  77 PR Interval:    QRS Duration: 103 QT Interval:  392 QTC Calculation: 444 R Axis:   0 Text Interpretation:  Sinus rhythm Probable anteroseptal infarct, old Borderline T abnormalities, inferior leads Baseline wander in lead(s) V3 Confirmed by Dene Gentry [phone]) on 10/05/2017 10:29:17 PM       Radiology Ct Head Wo Contrast  Result Date: 10/05/2017 CLINICAL DATA:  61 year old male with left upper extremity numbness in weakness beginning 2 weeks ago. New onset left lower extremity symptoms today. Cocaine use last night. EXAM: CT HEAD WITHOUT CONTRAST TECHNIQUE: Contiguous axial images were obtained from the base of the skull through the vertex without intravenous contrast. COMPARISON:  Head CT without contrast 03/10/2017 and earlier. FINDINGS: Brain: Stable cerebral volume. No midline shift, ventriculomegaly, mass effect, evidence of mass lesion, intracranial hemorrhage or evidence of cortically based acute infarction. Gray-white matter  differentiation is within normal limits for age throughout the brain. No cortical encephalomalacia. Vascular: Calcified atherosclerosis at the skull base. No suspicious intracranial vascular hyperdensity. Skull: No acute osseous abnormality identified. Sinuses/Orbits: Chronic paranasal sinus inflammation has been moderate to severe in the left maxillary sinus since 2016. Small superimposed right maxillary sinus fluid level. Chronic ethmoid and left frontal sinus involvement. Sphenoid sinuses, tympanic cavities, and mastoids remain clear. Other: Visualized orbits and scalp soft tissues are within normal limits. IMPRESSION: 1. Stable and normal for age non contrast CT appearance of the brain. 2. Chronic paranasal sinusitis with possible superimposed acute inflammation. Electronically Signed   By: Genevie Ann M.D.   On: 10/05/2017 19:56    Procedures Procedures (including critical care time)  Medications Ordered in ED Medications - No data to display   Initial Impression / Assessment and Plan / ED Course  I have reviewed the triage vital signs and the nursing notes.  Pertinent labs & imaging results that were available during my care of the patient were reviewed by me and considered in my medical decision making (see chart for details).    2215 Patient appears to be improved upon my reevaluation.  He declines to stay for further testing.  He is aware of the need for close follow-up.  He reports that he has an appointment with his regular doctor on Thursday (in 48 hours).  He promises that he will keep this appointment.  He is aware of the need to drink alcohol in moderation and to abstain from the use of illegal drugs such as cocaine.   MSE complete  Patient's presentation is most consistent with alcohol intoxication.  Reported paresthesias do not appear to be associated with significant weakness.  His neuro exam, given his level of intoxication, is fairly normal.  Screening workup in the ED does not  reveal acute pathology.  After a period of observation he appears to be clinically sober.  He appears to be more comfortable.  He desires discharge home and was able to ambulate easily and without apparent difficulty from the room.    Final Clinical Impressions(s) /  ED Diagnoses   Final diagnoses:  Paresthesia  Alcoholic intoxication without complication [facility])    ED Discharge Orders    None       Valarie Merino, MD 10/05/17 2229

## 2018-02-20 ENCOUNTER — Emergency Department
Admission: EM | Admit: 2018-02-20 | Discharge: 2018-02-20 | Disposition: A | Discharge status: Home / Self Care | Payer: Medicaid Other | Attending: Emergency Medicine | Admitting: Emergency Medicine

## 2018-02-20 ENCOUNTER — Emergency Department: Payer: Medicaid Other

## 2018-02-20 ENCOUNTER — Other Ambulatory Visit: Payer: Self-pay

## 2018-02-20 DIAGNOSIS — W270XXA Contact with workbench tool, initial encounter: Secondary | ICD-10-CM | POA: Diagnosis not present

## 2018-02-20 DIAGNOSIS — Z79899 Other long term (current) drug therapy: Secondary | ICD-10-CM | POA: Diagnosis not present

## 2018-02-20 DIAGNOSIS — J449 Chronic obstructive pulmonary disease, unspecified: Secondary | ICD-10-CM | POA: Diagnosis not present

## 2018-02-20 DIAGNOSIS — M25532 Pain in left wrist: Secondary | ICD-10-CM | POA: Diagnosis present

## 2018-02-20 DIAGNOSIS — F17210 Nicotine dependence, cigarettes, uncomplicated: Secondary | ICD-10-CM | POA: Diagnosis not present

## 2018-02-20 DIAGNOSIS — Z7901 Long term (current) use of anticoagulants: Secondary | ICD-10-CM | POA: Diagnosis not present

## 2018-02-20 DIAGNOSIS — I10 Essential (primary) hypertension: Secondary | ICD-10-CM | POA: Diagnosis not present

## 2018-02-20 DIAGNOSIS — Y9389 Activity, other specified: Secondary | ICD-10-CM | POA: Diagnosis not present

## 2018-02-20 DIAGNOSIS — Y929 Unspecified place or not applicable: Secondary | ICD-10-CM | POA: Insufficient documentation

## 2018-02-20 DIAGNOSIS — R030 Elevated blood-pressure reading, without diagnosis of hypertension: Secondary | ICD-10-CM

## 2018-02-20 DIAGNOSIS — Y999 Unspecified external cause status: Secondary | ICD-10-CM | POA: Diagnosis not present

## 2018-02-20 MED ORDER — TRAMADOL HCL 50 MG PO TABS: 50.0000 mg | ORAL_TABLET | Freq: Four times a day (QID) | ORAL | 0 refills | Status: DC | PRN

## 2018-02-20 NOTE — ED Provider Notes (Signed)
[facility] Emergency Department Provider Note  ____________________________________________   First MD Initiated Contact with Patient 02/20/18 1031     (approximate)  I have reviewed the triage vital signs and the nursing notes.   HISTORY  Chief Complaint Wrist Pain   HPI Brent Byrd is a 62 y.o. male is here with complaint of left wrist pain for 1 month.  Patient states he also hit his wrist with a hammer 1 week ago which is also caused increased pain.  He is not taking any over-the-counter medication other than once he took a BC powder.  He denies any previous issues with his wrist.  Blood pressure was elevated and patient states that he was taken off his blood pressure medication by his doctor 5 years ago.  He rates his pain is an 8 out of 10.   Past Medical History:  Diagnosis Date  . Alcohol abuse   . Arthritis    Left knee, lower back  . Chest pain    hx  . COPD (chronic obstructive pulmonary disease) ([place])    "[place] said I don't have this" (10/26/2016)  . Depression    no meds  . DVT (deep venous thrombosis) ([place]) 2008   Right leg- txed with coumadin   . DVT (deep venous thrombosis) ([place]) 2010; 2017   RLE; RLE  . Gastroesophageal reflux disease 2014  . Hypertension     per patiente-controlled, was taken off BP med  . MI, old ~ 2007  . Stroke ([place]) 01/2012   denies residual on 10/26/2016  . Substance abuse ([place])    Alcohol dependence Hx  . Tobacco abuse     Patient Active Problem List   Diagnosis Date Noted  . CAP (community acquired pneumonia) 10/25/2016  . Acute respiratory failure with hypoxia ([place]) 10/25/2016  . Rhinitis, non-allergic 10/25/2016  . SOB (shortness of breath) 04/13/2016  . Hypotension 04/13/2016  . Acute respiratory distress 04/13/2016  . Substance abuse ([place])   . Chest pain   . COPD (chronic obstructive pulmonary disease) ([place])   . Gastroesophageal reflux disease   . Hypertension   . Depression   .  Tobacco abuse   . Alcohol abuse   . Cigarette smoker 05/18/2015  . Asthmatic bronchitis with acute exacerbation 05/17/2015    Past Surgical History:  Procedure Laterality Date  . BACK SURGERY    . GANGLION CYST EXCISION Left   . HAMMER TOE SURGERY Left 07/2012   2nd toe/08/05/2012  . LUMBAR DISC SURGERY  06/2007   Herniated disc/notes 02/25/2011  . WISDOM TOOTH EXTRACTION      Prior to Admission medications   Medication Sig Start Date End Date Taking? Authorizing Provider  apixaban (ELIQUIS) 5 MG TABS tablet Take 1 tablet (5 mg total) by mouth 2 (two) times daily. Patient taking differently: Take 5 mg by mouth daily.  04/21/16   Thurnell Lose, MD  folic acid (FOLVITE) 1 MG tablet Take 1 tablet (1 mg total) by mouth daily. 04/13/16   Thurnell Lose, MD  nitroGLYCERIN (NITROSTAT) 0.4 MG SL tablet Place 0.4 mg under the tongue every 5 (five) minutes as needed for chest pain. Reported on 02/25/2016    [provider]  thiamine (VITAMIN B-1) 100 MG tablet Take 1 tablet (100 mg total) by mouth daily. 04/13/16   Thurnell Lose, MD  traMADol (ULTRAM) 50 MG tablet Take 1 tablet (50 mg total) by mouth every 6 (six) hours as needed. 02/20/18  Johnn Hai, PA-C    Allergies Iodine and Omnipaque [iohexol]  Family History  Problem Relation Age of Onset  . Asthma Mother   . Cancer Father        Lung  . Cancer Sister   . Thrombosis Sister   . Diabetes Sister   . Colon cancer Neg Hx   . Rectal cancer Neg Hx   . Stomach cancer Neg Hx   . Esophageal cancer Neg Hx     Social History Social History   Tobacco Use  . Smoking status: Current Every Day Smoker    Packs/day: 0.50    Years: 45.00    Pack years: 22.50    Types: Cigarettes  . Smokeless tobacco: Never Used  Substance Use Topics  . Alcohol use: Yes    Alcohol/week: 0.6 oz    Types: 1 Cans of beer per week    Comment: daily  . Drug use: Not Currently    Types: "Crack" cocaine, Cocaine    Comment:  10/26/2016 "crack once q 2-3 months"    Review of Systems Constitutional: No fever/chills Cardiovascular: Denies chest pain. Respiratory: Denies shortness of breath. Gastrointestinal:   No nausea, no vomiting.   Musculoskeletal: Positive for left wrist pain. Skin: Negative for rash. Neurological: Negative for  focal weakness or numbness. ____________________________________________   PHYSICAL EXAM:  VITAL SIGNS: ED Triage Vitals [02/20/18 1018]  Enc Vitals Group     BP (!) 132/97     Pulse Rate 64     Resp 16     Temp 98 F (36.7 C)     Temp Source Oral     SpO2 99 %     Weight 260 lb (117.9 kg)     Height 6\' 1"  (1.854 m)     Head Circumference      Peak Flow      Pain Score 8     Pain Loc      Pain Edu?      Excl. in [place]?    Constitutional: Alert and oriented. Well appearing and in no acute distress. Eyes: Conjunctivae are normal.  Head: Atraumatic. Neck: No stridor.   Cardiovascular: Normal rate, regular rhythm. Grossly normal heart sounds.  Good peripheral circulation. Respiratory: Normal respiratory effort.  No retractions. Lungs CTAB. Musculoskeletal: Examination of the left wrist there is no gross deformity and no appreciated soft tissue swelling present.  There is no erythema or abrasions seen.  Range of motion is restricted secondary to patient's discomfort.  Motor and sensory function intact distal to his injury and capillary refill is less than 3 seconds. Neurologic:  Normal speech and language. No gross focal neurologic deficits are appreciated.  Skin:  Skin is warm, dry and intact. No rash noted. Psychiatric: Mood and affect are normal. Speech and behavior are normal.  ____________________________________________   LABS (all labs ordered are listed, but only abnormal results are displayed)  Labs Reviewed - No data to display  RADIOLOGY  ED MD interpretation:   Left wrist x-ray is negative for acute injury.  Official radiology report(s): Dg Wrist  Complete Left  Result Date: 02/20/2018 CLINICAL DATA:  Hit left wrist with hammer 1 week ago. Worsening wrist pain. Initial encounter. EXAM: LEFT WRIST - COMPLETE 3+ VIEW COMPARISON:  None. FINDINGS: There is no evidence of fracture or dislocation. Early degenerative spurring is seen involving the radial styloid process and distal radioulnar joint. No evidence of joint space narrowing. No other osseous abnormality identified. Soft tissues are  unremarkable. IMPRESSION: No acute findings. Electronically Signed   By: Earle Gell M.D.   On: 02/20/2018 11:48    ____________________________________________   PROCEDURES  Procedure(s) performed: None  Procedures  Critical Care performed: No  ____________________________________________   INITIAL IMPRESSION / ASSESSMENT AND PLAN / ED COURSE  As part of my medical decision making, I reviewed the following data within the electronic MEDICAL RECORD NUMBER Notes from prior ED visits and [place] Controlled Substance Database  Patient was placed in a cock-up wrist splint.  Patient was placed on tramadol 1 every 6 hours as needed for pain.  Patient is to follow-up with his doctor in [place] as his blood pressure was elevated today and patient states that he has been off his blood pressure medication for 5 years.  ____________________________________________   FINAL CLINICAL IMPRESSION(S) / ED DIAGNOSES  Final diagnoses:  Acute pain of left wrist  Elevated blood pressure reading     ED Discharge Orders        Ordered    traMADol (ULTRAM) 50 MG tablet  Every 6 hours PRN     02/20/18 1159       Note:  This document was prepared using Dragon voice recognition software and may include unintentional dictation errors.    Johnn Hai, PA-C 02/20/18 1536    Hinda Kehr, MD 02/22/18 [phone]
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
## 2018-02-20 NOTE — ED Notes (Signed)
Pt ambulatory to toilet

## 2018-02-20 NOTE — ED Notes (Signed)
X-ray at bedside

## 2018-02-20 NOTE — Discharge Instructions (Signed)
Follow-up with your doctor in [place] as your blood pressure was elevated while you are in the emergency room today.  Your last blood pressure just before discharge was 124/99.  Wear wrist splint for protection and support.  Take tramadol 1 every 6 hours as needed for pain.

## 2018-05-17 IMAGING — DX DG TIBIA/FIBULA 2V*L*
4 series · 4 of 4 positions shown · non-contrast
Comparison: None.

CLINICAL DATA: Pain following fall

EXAM:
LEFT TIBIA AND FIBULA - 2 VIEW

[x tib-fib ap left (1 of 2)]
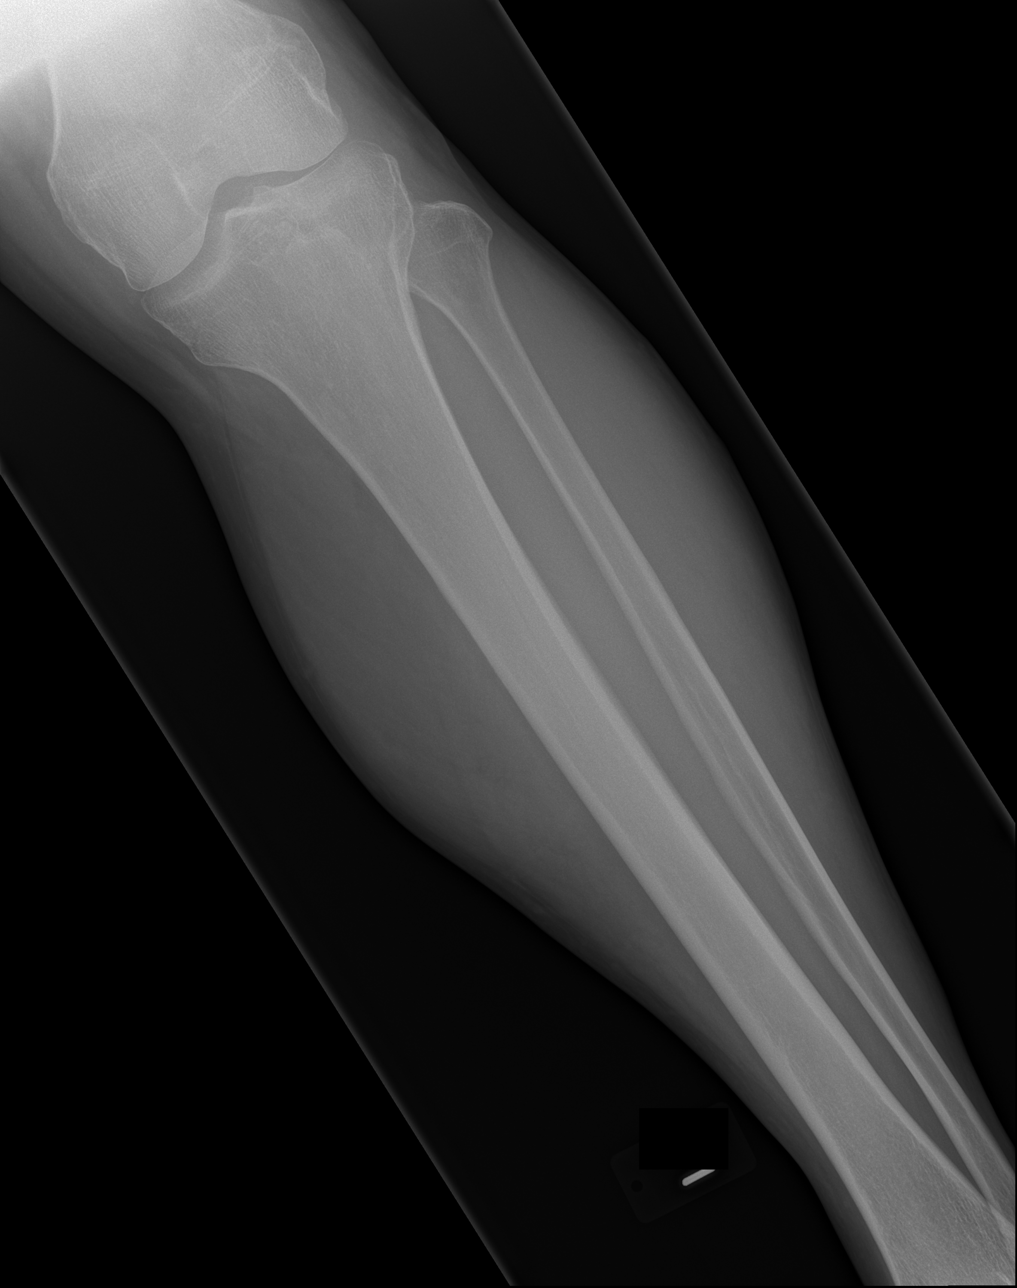

[x tib-fib ap left (2 of 2)]
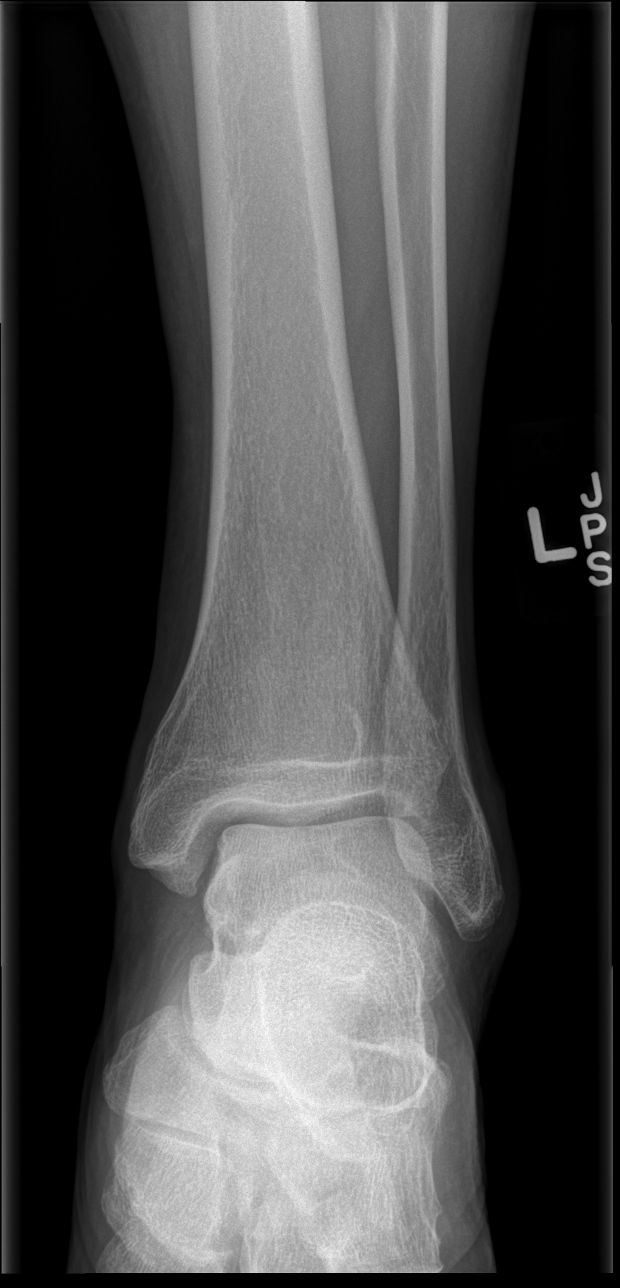

[x tib-fib lat left (1 of 2)]
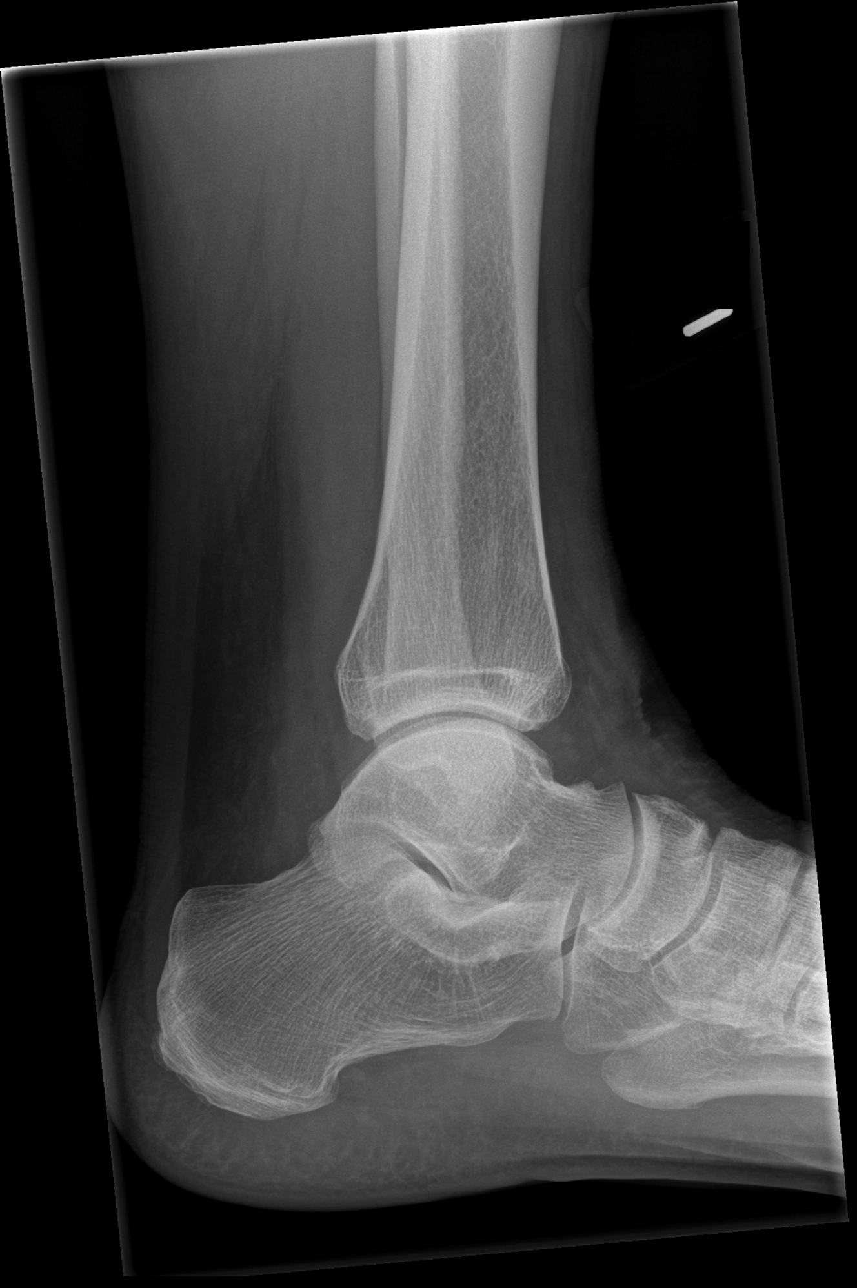

[x tib-fib lat left (2 of 2)]
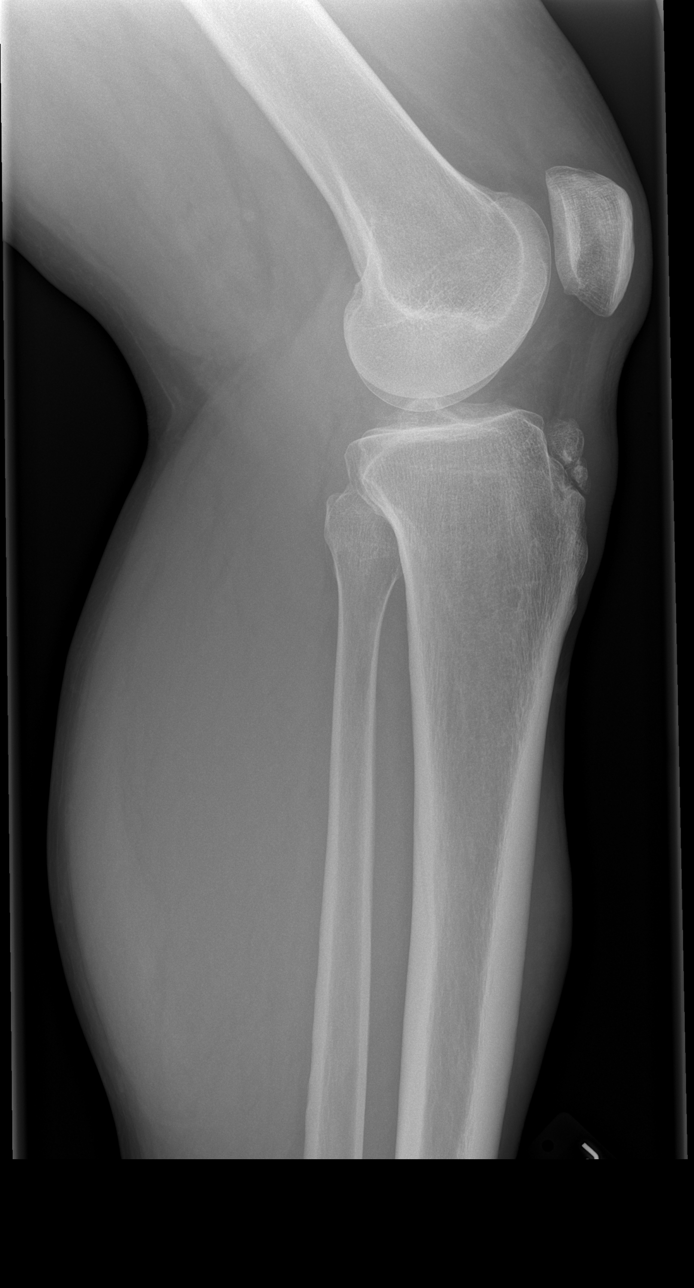

[4 of 4 positions shown; findings below may reference images not displayed]

FINDINGS: Frontal and lateral views were obtained. There is no evident
fracture or dislocation. Joint spaces appear unremarkable. No
abnormal periosteal reaction. There are unfused apophyses along the
proximal tibia anteriorly.
IMPRESSION: No acute fracture or dislocation.  No evident arthropathy.

## 2018-07-11 ENCOUNTER — Emergency Department (HOSPITAL_COMMUNITY): Payer: Medicaid Other

## 2018-07-11 ENCOUNTER — Other Ambulatory Visit: Payer: Self-pay

## 2018-07-11 ENCOUNTER — Emergency Department (HOSPITAL_COMMUNITY)
Admission: EM | Admit: 2018-07-11 | Discharge: 2018-07-11 | Disposition: A | Discharge status: Home / Self Care | Payer: Medicaid Other | Attending: Emergency Medicine | Admitting: Emergency Medicine

## 2018-07-11 ENCOUNTER — Encounter (HOSPITAL_COMMUNITY): Payer: Self-pay

## 2018-07-11 DIAGNOSIS — I252 Old myocardial infarction: Secondary | ICD-10-CM | POA: Diagnosis not present

## 2018-07-11 DIAGNOSIS — M25512 Pain in left shoulder: Secondary | ICD-10-CM | POA: Insufficient documentation

## 2018-07-11 DIAGNOSIS — M545 Low back pain: Secondary | ICD-10-CM | POA: Diagnosis not present

## 2018-07-11 DIAGNOSIS — Z79899 Other long term (current) drug therapy: Secondary | ICD-10-CM | POA: Diagnosis not present

## 2018-07-11 DIAGNOSIS — I10 Essential (primary) hypertension: Secondary | ICD-10-CM | POA: Insufficient documentation

## 2018-07-11 DIAGNOSIS — Z8673 Personal history of transient ischemic attack (TIA), and cerebral infarction without residual deficits: Secondary | ICD-10-CM | POA: Diagnosis not present

## 2018-07-11 DIAGNOSIS — Z7901 Long term (current) use of anticoagulants: Secondary | ICD-10-CM | POA: Diagnosis not present

## 2018-07-11 DIAGNOSIS — F17210 Nicotine dependence, cigarettes, uncomplicated: Secondary | ICD-10-CM | POA: Insufficient documentation

## 2018-07-11 DIAGNOSIS — R51 Headache: Secondary | ICD-10-CM | POA: Diagnosis not present

## 2018-07-11 DIAGNOSIS — J449 Chronic obstructive pulmonary disease, unspecified: Secondary | ICD-10-CM | POA: Insufficient documentation

## 2018-07-11 LAB — CBC WITH DIFFERENTIAL/PLATELET
ABS IMMATURE GRANULOCYTES: 0 10*3/uL (ref 0.0–0.1)
BASOS ABS: 0 10*3/uL (ref 0.0–0.1)
Basophils Relative: 1 %
Eosinophils Absolute: 0.1 10*3/uL (ref 0.0–0.7)
Eosinophils Relative: 4 %
HCT: 41 % (ref 39.0–52.0)
HEMOGLOBIN: 13.6 g/dL (ref 13.0–17.0)
IMMATURE GRANULOCYTES: 0 %
LYMPHS PCT: 38 %
Lymphs Abs: 1.3 10*3/uL (ref 0.7–4.0)
MCH: 32.3 pg (ref 26.0–34.0)
MCHC: 33.2 g/dL (ref 30.0–36.0)
MCV: 97.4 fL (ref 78.0–100.0)
MONO ABS: 0.3 10*3/uL (ref 0.1–1.0)
Monocytes Relative: 10 %
NEUTROS PCT: 47 %
Neutro Abs: 1.6 10*3/uL — ABNORMAL LOW (ref 1.7–7.7)
PLATELETS: 203 10*3/uL (ref 150–400)
RBC: 4.21 MIL/uL — ABNORMAL LOW (ref 4.22–5.81)
RDW: 14.8 % (ref 11.5–15.5)
WBC: 3.4 10*3/uL — ABNORMAL LOW (ref 4.0–10.5)

## 2018-07-11 LAB — COMPREHENSIVE METABOLIC PANEL
ALBUMIN: 3.6 g/dL (ref 3.5–5.0)
ALK PHOS: 59 U/L (ref 38–126)
ALT: 25 U/L (ref 0–44)
AST: 30 U/L (ref 15–41)
Anion gap: 12 (ref 5–15)
BUN: 7 mg/dL — AB (ref 8–23)
CALCIUM: 9 mg/dL (ref 8.9–10.3)
CHLORIDE: 107 mmol/L (ref 98–111)
CO2: 21 mmol/L — AB (ref 22–32)
CREATININE: 1.04 mg/dL (ref 0.61–1.24)
GFR calc non Af Amer: >60 mL/min (ref >60)
GLUCOSE: 84 mg/dL (ref 70–99)
Potassium: 4.4 mmol/L (ref 3.5–5.1)
SODIUM: 140 mmol/L (ref 135–145)
Total Bilirubin: 0.4 mg/dL (ref 0.3–1.2)
Total Protein: 6.7 g/dL (ref 6.5–8.1)

## 2018-07-11 MED ORDER — METHOCARBAMOL 500 MG PO TABS
750.0000 mg | ORAL_TABLET | Freq: Once | ORAL | Status: AC
  Administered 2018-07-11: 750 mg via ORAL
  Filled 2018-07-11: qty 2

## 2018-07-11 MED ORDER — METHOCARBAMOL 500 MG PO TABS: 500.0000 mg | ORAL_TABLET | Freq: Two times a day (BID) | ORAL | 0 refills | Status: AC

## 2018-07-11 NOTE — Discharge Instructions (Addendum)
I have prescribed muscle relaxers for your pain, please do not drink or drive while taking this medications as they can make you drowsy.  Please follow-up with PCP in 1 week for reevaluation of your symptoms.  You experience any bowel or bladder incontinence, fever, worsening in your symptoms please return to the ED. ° °
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
## 2018-07-11 NOTE — ED Notes (Signed)
Patient transported to X-ray 

## 2018-07-11 NOTE — ED Notes (Signed)
ED Provider at bedside. 

## 2019-05-18 ENCOUNTER — Emergency Department (HOSPITAL_COMMUNITY): Payer: Medicaid Other

## 2019-05-18 ENCOUNTER — Other Ambulatory Visit: Payer: Self-pay

## 2019-05-18 ENCOUNTER — Emergency Department (HOSPITAL_COMMUNITY)
Admission: EM | Admit: 2019-05-18 | Discharge: 2019-05-19 | Disposition: A | Discharge status: Home / Self Care | Payer: Medicaid Other | Attending: Emergency Medicine | Admitting: Emergency Medicine

## 2019-05-18 DIAGNOSIS — Z7901 Long term (current) use of anticoagulants: Secondary | ICD-10-CM | POA: Diagnosis not present

## 2019-05-18 DIAGNOSIS — Z79899 Other long term (current) drug therapy: Secondary | ICD-10-CM | POA: Diagnosis not present

## 2019-05-18 DIAGNOSIS — F17210 Nicotine dependence, cigarettes, uncomplicated: Secondary | ICD-10-CM | POA: Diagnosis not present

## 2019-05-18 DIAGNOSIS — S0990XA Unspecified injury of head, initial encounter: Secondary | ICD-10-CM

## 2019-05-18 DIAGNOSIS — R0782 Intercostal pain: Secondary | ICD-10-CM | POA: Insufficient documentation

## 2019-05-18 DIAGNOSIS — Y939 Activity, unspecified: Secondary | ICD-10-CM | POA: Diagnosis not present

## 2019-05-18 DIAGNOSIS — Z23 Encounter for immunization: Secondary | ICD-10-CM | POA: Insufficient documentation

## 2019-05-18 DIAGNOSIS — J449 Chronic obstructive pulmonary disease, unspecified: Secondary | ICD-10-CM | POA: Insufficient documentation

## 2019-05-18 DIAGNOSIS — Y999 Unspecified external cause status: Secondary | ICD-10-CM | POA: Insufficient documentation

## 2019-05-18 DIAGNOSIS — R55 Syncope and collapse: Secondary | ICD-10-CM | POA: Insufficient documentation

## 2019-05-18 DIAGNOSIS — I10 Essential (primary) hypertension: Secondary | ICD-10-CM | POA: Diagnosis not present

## 2019-05-18 DIAGNOSIS — S01411A Laceration without foreign body of right cheek and temporomandibular area, initial encounter: Secondary | ICD-10-CM | POA: Insufficient documentation

## 2019-05-18 DIAGNOSIS — S0993XA Unspecified injury of face, initial encounter: Secondary | ICD-10-CM | POA: Diagnosis present

## 2019-05-18 DIAGNOSIS — Y929 Unspecified place or not applicable: Secondary | ICD-10-CM | POA: Diagnosis not present

## 2019-05-18 DIAGNOSIS — S01511A Laceration without foreign body of lip, initial encounter: Secondary | ICD-10-CM | POA: Insufficient documentation

## 2019-05-18 LAB — BASIC METABOLIC PANEL
Anion gap: 10 (ref 5–15)
BUN: 10 mg/dL (ref 8–23)
CO2: 20 mmol/L — ABNORMAL LOW (ref 22–32)
Calcium: 9.1 mg/dL (ref 8.9–10.3)
Chloride: 105 mmol/L (ref 98–111)
Creatinine, Ser: 0.96 mg/dL (ref 0.61–1.24)
GFR calc Af Amer: >60 mL/min (ref >60)
GFR calc non Af Amer: >60 mL/min (ref >60)
Glucose, Bld: 75 mg/dL (ref 70–99)
Potassium: 3.8 mmol/L (ref 3.5–5.1)
Sodium: 135 mmol/L (ref 135–145)

## 2019-05-18 LAB — URINALYSIS, ROUTINE W REFLEX MICROSCOPIC
Bilirubin Urine: NEGATIVE
Glucose, UA: NEGATIVE mg/dL
Hgb urine dipstick: NEGATIVE
Ketones, ur: NEGATIVE mg/dL
Leukocytes,Ua: NEGATIVE
Nitrite: NEGATIVE
Protein, ur: NEGATIVE mg/dL
Specific Gravity, Urine: 1 — ABNORMAL LOW (ref 1.005–1.030)
pH: 6 (ref 5.0–8.0)

## 2019-05-18 LAB — CBC
HCT: 40.5 % (ref 39.0–52.0)
Hemoglobin: 13.4 g/dL (ref 13.0–17.0)
MCH: 32.8 pg (ref 26.0–34.0)
MCHC: 33.1 g/dL (ref 30.0–36.0)
MCV: 99.3 fL (ref 80.0–100.0)
Platelets: 207 10*3/uL (ref 150–400)
RBC: 4.08 MIL/uL — ABNORMAL LOW (ref 4.22–5.81)
RDW: 14.3 % (ref 11.5–15.5)
WBC: 4.8 10*3/uL (ref 4.0–10.5)
nRBC: 0 % (ref 0.0–0.2)

## 2019-05-18 MED ORDER — LIDOCAINE HCL (PF) 1 % IJ SOLN
5.0000 mL | Freq: Once | INTRAMUSCULAR | Status: AC
  Administered 2019-05-19: 5 mL via INTRADERMAL
  Filled 2019-05-18: qty 5

## 2019-05-18 MED ORDER — TETANUS-DIPHTH-ACELL PERTUSSIS 5-2.5-18.5 LF-MCG/0.5 IM SUSP
0.5000 mL | Freq: Once | INTRAMUSCULAR | Status: AC
  Administered 2019-05-19: 0.5 mL via INTRAMUSCULAR
  Filled 2019-05-18: qty 0.5

## 2019-05-18 MED ORDER — ONDANSETRON 4 MG PO TBDP
4.0000 mg | ORAL_TABLET | Freq: Once | ORAL | Status: AC
  Administered 2019-05-19: 4 mg via ORAL
  Filled 2019-05-18: qty 1

## 2019-05-18 MED ORDER — SODIUM CHLORIDE 0.9% FLUSH: 3.0000 mL | Freq: Once | INTRAVENOUS | Status: DC

## 2019-05-18 MED ORDER — OXYCODONE-ACETAMINOPHEN 5-325 MG PO TABS
2.0000 | ORAL_TABLET | Freq: Once | ORAL | Status: AC
  Administered 2019-05-19: 2 via ORAL
  Filled 2019-05-18: qty 2
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
## 2019-05-19 ENCOUNTER — Emergency Department (HOSPITAL_COMMUNITY): Payer: Medicaid Other

## 2019-05-19 MED ORDER — HYDROCODONE-ACETAMINOPHEN 5-325 MG PO TABS: 1.0000 | ORAL_TABLET | ORAL | 0 refills | Status: DC | PRN

## 2019-05-19 NOTE — ED Notes (Signed)
Discharge instructions discussed with pt. Pt verbalized understanding. Pt stable and ambulatory. No signature pad available. 

## 2019-05-19 NOTE — Discharge Instructions (Signed)
Your CT scans and x-rays showed no fractures, bleeding.  You may use your incentive spirometer every 1-2 hours while awake for the next 2 weeks to help prevent pneumonia.  You have glue over the laceration in your right cheek.  Please do not apply Neosporin or triple antibiotic ointment to this area as this can breakdown the glue prematurely.  You have 2 stitches in your upper lip.  These will absorb over time and you do not need to have them removed.  Please clean this area very gently for the next week and do not shave.

## 2019-05-19 NOTE — ED Notes (Signed)
Pt returned to room  

## 2019-05-19 NOTE — ED Notes (Signed)
Patient transported to CT 

## 2019-08-30 ENCOUNTER — Other Ambulatory Visit: Payer: Self-pay

## 2019-08-30 ENCOUNTER — Emergency Department (HOSPITAL_COMMUNITY): Payer: Medicaid Other

## 2019-08-30 ENCOUNTER — Emergency Department (HOSPITAL_COMMUNITY)
Admission: EM | Admit: 2019-08-30 | Discharge: 2019-08-30 | Disposition: A | Discharge status: Home / Self Care | Payer: Medicaid Other | Attending: Emergency Medicine | Admitting: Emergency Medicine

## 2019-08-30 DIAGNOSIS — Z7901 Long term (current) use of anticoagulants: Secondary | ICD-10-CM | POA: Insufficient documentation

## 2019-08-30 DIAGNOSIS — R519 Headache, unspecified: Secondary | ICD-10-CM | POA: Diagnosis not present

## 2019-08-30 DIAGNOSIS — F17210 Nicotine dependence, cigarettes, uncomplicated: Secondary | ICD-10-CM | POA: Diagnosis not present

## 2019-08-30 DIAGNOSIS — I10 Essential (primary) hypertension: Secondary | ICD-10-CM | POA: Insufficient documentation

## 2019-08-30 DIAGNOSIS — J449 Chronic obstructive pulmonary disease, unspecified: Secondary | ICD-10-CM | POA: Diagnosis not present

## 2019-08-30 DIAGNOSIS — R42 Dizziness and giddiness: Secondary | ICD-10-CM | POA: Diagnosis not present

## 2019-08-30 DIAGNOSIS — R251 Tremor, unspecified: Secondary | ICD-10-CM | POA: Insufficient documentation

## 2019-08-30 LAB — CBC WITH DIFFERENTIAL/PLATELET
Abs Immature Granulocytes: 0.01 10*3/uL (ref 0.00–0.07)
Basophils Absolute: 0.1 10*3/uL (ref 0.0–0.1)
Basophils Relative: 1 %
Eosinophils Absolute: 0.2 10*3/uL (ref 0.0–0.5)
Eosinophils Relative: 5 %
HCT: 42 % (ref 39.0–52.0)
Hemoglobin: 14 g/dL (ref 13.0–17.0)
Immature Granulocytes: 0 %
Lymphocytes Relative: 38 %
Lymphs Abs: 1.6 10*3/uL (ref 0.7–4.0)
MCH: 32.6 pg (ref 26.0–34.0)
MCHC: 33.3 g/dL (ref 30.0–36.0)
MCV: 97.7 fL (ref 80.0–100.0)
Monocytes Absolute: 0.4 10*3/uL (ref 0.1–1.0)
Monocytes Relative: 10 %
Neutro Abs: 1.9 10*3/uL (ref 1.7–7.7)
Neutrophils Relative %: 46 %
Platelets: 193 10*3/uL (ref 150–400)
RBC: 4.3 MIL/uL (ref 4.22–5.81)
RDW: 13.2 % (ref 11.5–15.5)
WBC: 4.2 10*3/uL (ref 4.0–10.5)
nRBC: 0 % (ref 0.0–0.2)

## 2019-08-30 LAB — BASIC METABOLIC PANEL
Anion gap: 10 (ref 5–15)
BUN: 9 mg/dL (ref 8–23)
CO2: 21 mmol/L — ABNORMAL LOW (ref 22–32)
Calcium: 8.7 mg/dL — ABNORMAL LOW (ref 8.9–10.3)
Chloride: 106 mmol/L (ref 98–111)
Creatinine, Ser: 1.03 mg/dL (ref 0.61–1.24)
GFR calc Af Amer: >60 mL/min (ref >60)
GFR calc non Af Amer: >60 mL/min (ref >60)
Glucose, Bld: 131 mg/dL — ABNORMAL HIGH (ref 70–99)
Potassium: 3.7 mmol/L (ref 3.5–5.1)
Sodium: 137 mmol/L (ref 135–145)

## 2019-08-30 LAB — PROTIME-INR
INR: 0.9 (ref 0.8–1.2)
Prothrombin Time: 12.4 seconds (ref 11.4–15.2)

## 2019-08-30 MED ORDER — DIPHENHYDRAMINE HCL 50 MG/ML IJ SOLN
25.0000 mg | Freq: Once | INTRAMUSCULAR | Status: AC
  Administered 2019-08-30: 10:00:00 25 mg via INTRAVENOUS
  Filled 2019-08-30: qty 1

## 2019-08-30 MED ORDER — ACETAMINOPHEN 325 MG PO TABS
650.0000 mg | ORAL_TABLET | Freq: Once | ORAL | Status: AC
  Administered 2019-08-30: 650 mg via ORAL
  Filled 2019-08-30: qty 2

## 2019-08-30 MED ORDER — OXYCODONE-ACETAMINOPHEN 5-325 MG PO TABS: 1.0000 | ORAL_TABLET | ORAL | Status: DC | PRN

## 2019-08-30 MED ORDER — KETOROLAC TROMETHAMINE 30 MG/ML IJ SOLN
30.0000 mg | Freq: Once | INTRAMUSCULAR | Status: AC
  Administered 2019-08-30: 30 mg via INTRAVENOUS
  Filled 2019-08-30: qty 1

## 2019-08-30 MED ORDER — GABAPENTIN 100 MG PO CAPS: 100.0000 mg | ORAL_CAPSULE | Freq: Three times a day (TID) | ORAL | 0 refills | Status: DC | PRN

## 2019-08-30 MED ORDER — METOCLOPRAMIDE HCL 5 MG/ML IJ SOLN
10.0000 mg | Freq: Once | INTRAMUSCULAR | Status: AC
  Administered 2019-08-30: 10 mg via INTRAVENOUS
  Filled 2019-08-30: qty 2

## 2019-08-30 NOTE — ED Notes (Signed)
Patient is resting comfortably. 

## 2019-08-30 NOTE — ED Triage Notes (Signed)
Per pt he has been having 10/10 headache that is on the right side behind his ear. Pt said some dizziness, no gate issues, no blurred vision. Pupils equal and reactive.. Pt was on eliquis and has not taken for 3 days now. Pt said throbbing every 15 seconds off and on sensation. 140/90

## 2019-08-30 NOTE — ED Provider Notes (Signed)
[facility] EMERGENCY DEPARTMENT Provider Note   CSN: [id number] Arrival date & time: 08/30/19  0253     History   Chief Complaint Chief Complaint  Patient presents with  . Headache    HPI Brent Byrd is a 63 y.o. male.     The history is provided by the patient and medical records. No language interpreter was used.  Headache    63 year old male with history of DVT currently on Eliquis, COPD, prior MI, stroke, polysubstance abuse including alcohol abuse brought here via EMS from home for evaluation of headache.  Patient report gradual onset of intermittent stabbing sharp headache to the back of his right parietal region.  Headache has become increasing in frequency and duration, happen every few minutes to an hour every few seconds.  And dorsum mild dizziness with tremors in his hands.  He felt that his eyes were sore.  He endorsed mild light and sound sensitivity.  He denies fever, neck pain, neck stiffness, hearing changes, focal numbness or focal weakness, diplopia, chest pain, shortness of breath, nausea, vomiting.  No history of shingles.  He tries taking ibuprofen and Tylenol at home as well as trazodone without adequate relief.  He does not normally have headaches.  Patient ran out of his Eliquis 3 days ago and did reach out his doctor.  He does have a prescription for the medication but have not filled it yet.    Past Medical History:  Diagnosis Date  . Alcohol abuse   . Arthritis    Left knee, lower back  . Chest pain    hx  . COPD (chronic obstructive pulmonary disease) ([place])    "[place] said I don't have this" (10/26/2016)  . Depression    no meds  . DVT (deep venous thrombosis) ([place]) 2008   Right leg- txed with coumadin   . DVT (deep venous thrombosis) ([place]) 2010; 2017   RLE; RLE  . Gastroesophageal reflux disease 2014  . Hypertension     per patiente-controlled, was taken off BP med  . MI, old ~ 2007  . Stroke ([place]) 01/2012   denies  residual on 10/26/2016  . Substance abuse ([place])    Alcohol dependence Hx  . Tobacco abuse     Patient Active Problem List   Diagnosis Date Noted  . CAP (community acquired pneumonia) 10/25/2016  . Acute respiratory failure with hypoxia ([place]) 10/25/2016  . Rhinitis, non-allergic 10/25/2016  . SOB (shortness of breath) 04/13/2016  . Hypotension 04/13/2016  . Acute respiratory distress 04/13/2016  . Substance abuse ([place])   . Chest pain   . COPD (chronic obstructive pulmonary disease) ([place])   . Gastroesophageal reflux disease   . Hypertension   . Depression   . Tobacco abuse   . Alcohol abuse   . Cigarette smoker 05/18/2015  . Asthmatic bronchitis with acute exacerbation 05/17/2015    Past Surgical History:  Procedure Laterality Date  . BACK SURGERY    . GANGLION CYST EXCISION Left   . HAMMER TOE SURGERY Left 07/2012   2nd toe/08/05/2012  . LUMBAR DISC SURGERY  06/2007   Herniated disc/notes 02/25/2011  . WISDOM TOOTH EXTRACTION          Home Medications    Prior to Admission medications   Medication Sig Start Date End Date Taking? Authorizing Provider  apixaban (ELIQUIS) 5 MG TABS tablet Take 1 tablet (5 mg total) by mouth 2 (two) times daily. Patient taking differently: Take 5 mg by  mouth daily.  04/21/16   Thurnell Lose, MD  folic acid (FOLVITE) 1 MG tablet Take 1 tablet (1 mg total) by mouth daily. Patient not taking: Reported on 07/11/2018 04/13/16   Thurnell Lose, MD  HYDROcodone-acetaminophen (NORCO/VICODIN) 5-325 MG tablet Take 1 tablet by mouth every 4 (four) hours as needed. 05/19/19   Ward, Delice Bison, DO  naproxen sodium (ALEVE) 220 MG tablet Take 220 mg by mouth daily as needed (pain).    [provider]  nitroGLYCERIN (NITROSTAT) 0.4 MG SL tablet Place 0.4 mg under the tongue every 5 (five) minutes as needed for chest pain. Reported on 02/25/2016    [provider]  thiamine (VITAMIN B-1) 100 MG tablet Take 1 tablet (100 mg total) by mouth  daily. 04/13/16   Thurnell Lose, MD  traMADol (ULTRAM) 50 MG tablet Take 1 tablet (50 mg total) by mouth every 6 (six) hours as needed. Patient taking differently: Take 50 mg by mouth every 6 (six) hours as needed for moderate pain.  02/20/18   Johnn Hai, PA-C    Family History Family History  Problem Relation Age of Onset  . Asthma Mother   . Cancer Father        Lung  . Cancer Sister   . Thrombosis Sister   . Diabetes Sister   . Colon cancer Neg Hx   . Rectal cancer Neg Hx   . Stomach cancer Neg Hx   . Esophageal cancer Neg Hx     Social History Social History   Tobacco Use  . Smoking status: Current Every Day Smoker    Packs/day: 0.50    Years: 45.00    Pack years: 22.50    Types: Cigarettes  . Smokeless tobacco: Never Used  Substance Use Topics  . Alcohol use: Yes    Alcohol/week: 1.0 standard drinks    Types: 1 Cans of beer per week    Comment: daily  . Drug use: Not Currently    Types: "Crack" cocaine, Cocaine    Comment: 10/26/2016 "crack once q 2-3 months"     Allergies   Iodine and Omnipaque [iohexol]   Review of Systems Review of Systems  Neurological: Positive for headaches.  All other systems reviewed and are negative.    Physical Exam Updated Vital Signs BP (!) 143/99 (BP Location: Right Arm)   Pulse 88   Temp 98.4 F (36.9 C) (Oral)   Resp 18   SpO2 95%   Physical Exam Vitals signs and nursing note reviewed.  Constitutional:      General: He is not in acute distress.    Appearance: He is well-developed.  HENT:     Head: Atraumatic.  Eyes:     General: No visual field deficit.    Extraocular Movements: Extraocular movements intact.     Conjunctiva/sclera: Conjunctivae normal.     Pupils: Pupils are equal, round, and reactive to light.     Comments: Pupils equal round and reactive to light and accommodation.  Evidence of arcus senilis.  Neck:     Musculoskeletal: Normal range of motion and neck supple. No neck rigidity.   Cardiovascular:     Rate and Rhythm: Normal rate and regular rhythm.  Pulmonary:     Effort: Pulmonary effort is normal.     Breath sounds: Normal breath sounds.  Abdominal:     Palpations: Abdomen is soft.  Skin:    Findings: No rash.  Neurological:     Mental Status:  He is alert and oriented to person, place, and time.     GCS: GCS eye subscore is 4. GCS verbal subscore is 5. GCS motor subscore is 6.     Cranial Nerves: No cranial nerve deficit, dysarthria or facial asymmetry.     Sensory: No sensory deficit.     Motor: No weakness.     Deep Tendon Reflexes: Reflexes normal.  Psychiatric:        Mood and Affect: Mood normal.      ED Treatments / Results  Labs (all labs ordered are listed, but only abnormal results are displayed) Labs Reviewed  BASIC METABOLIC PANEL - Abnormal; Notable for the following components:      Result Value   CO2 21 (*)    Glucose, Bld 131 (*)    Calcium 8.7 (*)    All other components within normal limits  CBC WITH DIFFERENTIAL/PLATELET  PROTIME-INR    EKG None  Radiology Ct Head Wo Contrast  Result Date: 08/30/2019 CLINICAL DATA:  Headache EXAM: CT HEAD WITHOUT CONTRAST TECHNIQUE: Contiguous axial images were obtained from the base of the skull through the vertex without intravenous contrast. COMPARISON:  05/18/2019 FINDINGS: Brain: No acute intracranial abnormality. Specifically, no hemorrhage, hydrocephalus, mass lesion, acute infarction, or significant intracranial injury. Vascular: No hyperdense vessel or unexpected calcification. Skull: No acute calvarial abnormality. Sinuses/Orbits: Visualized paranasal sinuses and mastoids clear. Orbital soft tissues unremarkable. Other: None IMPRESSION: No acute intracranial abnormality. Electronically Signed   By: Rolm Baptise M.D.   On: 08/30/2019 03:27    Procedures Procedures (including critical care time)  Medications Ordered in ED Medications  acetaminophen (TYLENOL) tablet 650 mg (650 mg  Oral Given 08/30/19 0310)  ketorolac (TORADOL) 30 MG/ML injection 30 mg (30 mg Intravenous Given 08/30/19 1028)  diphenhydrAMINE (BENADRYL) injection 25 mg (25 mg Intravenous Given 08/30/19 1028)  metoCLOPramide (REGLAN) injection 10 mg (10 mg Intravenous Given 08/30/19 1027)     Initial Impression / Assessment and Plan / ED Course  I have reviewed the triage vital signs and the nursing notes.  Pertinent labs & imaging results that were available during my care of the patient were reviewed by me and considered in my medical decision making (see chart for details).        BP (!) 138/95   Pulse 88   Temp 98.4 F (36.9 C) (Oral)   Resp 18   SpO2 95%    Final Clinical Impressions(s) / ED Diagnoses   Final diagnoses:  Bad headache    ED Discharge Orders         Ordered    gabapentin (NEURONTIN) 100 MG capsule  3 times daily PRN     08/30/19 1317         10:14 AM Patient here with recurrent localized pain to his right parietal scalp for the past few days suggestive of occipital neuritis.  Ear exam unremarkable.  He does not have any focal neuro deficit on exam.  He does not have any nuchal rigidity or fever to consider for meningitis.  He had a head CT scan that was unremarkable, low suspicion for subarachnoid hemorrhage or stroke.  He is overall well-appearing.  Will provide symptomatic treatment.  Care discussed with Dr. Wilson Singer  1:14 PM Head CT scan unremarkable, labs are reassuring.  After receiving migraine cocktail, patient felt much better, at this time he is stable for discharge.  Will prescribe Neurontin as needed for his headache which is likely due to nerve irritation.  Return precaution given.    Domenic Moras, PA-C 08/30/19 1318    Virgel Manifold, MD 08/31/19 [phone]

## 2019-09-14 ENCOUNTER — Other Ambulatory Visit: Payer: Self-pay

## 2019-09-14 ENCOUNTER — Encounter (HOSPITAL_COMMUNITY): Payer: Self-pay

## 2019-09-14 ENCOUNTER — Emergency Department (HOSPITAL_COMMUNITY)
Admission: EM | Admit: 2019-09-14 | Discharge: 2019-09-14 | Disposition: A | Discharge status: Home / Self Care | Payer: Medicaid Other | Attending: Emergency Medicine | Admitting: Emergency Medicine

## 2019-09-14 DIAGNOSIS — R110 Nausea: Secondary | ICD-10-CM | POA: Diagnosis not present

## 2019-09-14 DIAGNOSIS — F0781 Postconcussional syndrome: Secondary | ICD-10-CM | POA: Diagnosis not present

## 2019-09-14 DIAGNOSIS — F17210 Nicotine dependence, cigarettes, uncomplicated: Secondary | ICD-10-CM | POA: Diagnosis not present

## 2019-09-14 DIAGNOSIS — R519 Headache, unspecified: Secondary | ICD-10-CM | POA: Diagnosis present

## 2019-09-14 DIAGNOSIS — I10 Essential (primary) hypertension: Secondary | ICD-10-CM | POA: Diagnosis not present

## 2019-09-14 DIAGNOSIS — I252 Old myocardial infarction: Secondary | ICD-10-CM | POA: Diagnosis not present

## 2019-09-14 DIAGNOSIS — J449 Chronic obstructive pulmonary disease, unspecified: Secondary | ICD-10-CM | POA: Diagnosis not present

## 2019-09-14 MED ORDER — GABAPENTIN 100 MG PO CAPS: 200.0000 mg | ORAL_CAPSULE | Freq: Three times a day (TID) | ORAL | 0 refills | Status: DC | PRN

## 2019-09-14 MED ORDER — ONDANSETRON 4 MG PO TBDP: 4.0000 mg | ORAL_TABLET | Freq: Three times a day (TID) | ORAL | 0 refills | Status: DC | PRN
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
## 2019-09-14 NOTE — Discharge Instructions (Signed)
You were seen in the emergency department for headache.  I suspect your symptoms are related to concussion.  You can take Zofran as needed for nausea.  I have increased the dose of your gabapentin to take as needed for headache.  Please call your primary care doctor as well as the neurologist listed to schedule a follow-up appointment.  I have placed a referral to assist with this.  Please return to the emergency department any new or suddenly worsening symptoms.
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
## 2019-09-20 ENCOUNTER — Encounter: Payer: Self-pay | Admitting: Neurology

## 2019-09-20 ENCOUNTER — Other Ambulatory Visit: Payer: Self-pay

## 2019-09-20 ENCOUNTER — Ambulatory Visit: Payer: Medicaid Other | Admitting: Neurology

## 2019-09-20 VITALS — BP 132/97 | HR 73 | Temp 97.3°F | Ht 76.0 in | Wt 266.0 lb

## 2019-09-20 DIAGNOSIS — G4489 Other headache syndrome: Secondary | ICD-10-CM

## 2019-09-20 DIAGNOSIS — Z5181 Encounter for therapeutic drug level monitoring: Secondary | ICD-10-CM | POA: Diagnosis not present

## 2019-09-20 DIAGNOSIS — H814 Vertigo of central origin: Secondary | ICD-10-CM

## 2019-09-20 MED ORDER — PREDNISONE 10 MG PO TABS: ORAL_TABLET | ORAL | 0 refills | Status: DC

## 2019-09-20 MED ORDER — ALPRAZOLAM 0.5 MG PO TABS: ORAL_TABLET | ORAL | 0 refills | Status: DC

## 2019-09-20 MED ORDER — GABAPENTIN 300 MG PO CAPS: ORAL_CAPSULE | ORAL | 3 refills | Status: DC

## 2019-09-20 NOTE — Patient Instructions (Signed)
We will increase the gabapentin to 300 mg twice during the day and 2 at night, start a prednisone dose pack.  Neurontin (gabapentin) may result in drowsiness, ankle swelling, gait instability, or possibly dizziness. Please contact our office if significant side effects occur with this medication.
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
## 2019-09-21 LAB — COMPREHENSIVE METABOLIC PANEL
ALT: 84 IU/L — ABNORMAL HIGH (ref 0–44)
AST: 55 IU/L — ABNORMAL HIGH (ref 0–40)
Albumin/Globulin Ratio: 1.3 (ref 1.2–2.2)
Albumin: 3.9 g/dL (ref 3.8–4.8)
Alkaline Phosphatase: 87 IU/L (ref 39–117)
BUN/Creatinine Ratio: 13 (ref 10–24)
BUN: 11 mg/dL (ref 8–27)
Bilirubin Total: 0.2 mg/dL (ref 0.0–1.2)
CO2: 18 mmol/L — ABNORMAL LOW (ref 20–29)
Calcium: 9.3 mg/dL (ref 8.6–10.2)
Chloride: 101 mmol/L (ref 96–106)
Creatinine, Ser: 0.83 mg/dL (ref 0.76–1.27)
GFR calc Af Amer: 109 mL/min/{1.73_m2} (ref >59)
GFR calc non Af Amer: 94 mL/min/{1.73_m2} (ref >59)
Globulin, Total: 2.9 g/dL (ref 1.5–4.5)
Glucose: 90 mg/dL (ref 65–99)
Potassium: 4.2 mmol/L (ref 3.5–5.2)
Sodium: 138 mmol/L (ref 134–144)
Total Protein: 6.8 g/dL (ref 6.0–8.5)

## 2019-09-21 LAB — SEDIMENTATION RATE: Sed Rate: 30 mm/hr (ref 0–30)

## 2019-09-23 ENCOUNTER — Telehealth: Payer: Self-pay | Admitting: Neurology

## 2019-09-23 MED ORDER — GABAPENTIN 600 MG PO TABS: 600.0000 mg | ORAL_TABLET | Freq: Three times a day (TID) | ORAL | 3 refills | Status: DC

## 2019-09-23 NOTE — Telephone Encounter (Signed)
I called the patient.  The patient still having headaches.  Blood work was unremarkable with a normal sedimentation rate but the liver enzymes were elevated and CO2 level was slightly low.  He is still having headaches on gabapentin, I will increase the dose to 600 mg 3 times daily.  He is to get an MRI of the brain.

## 2019-10-14 ENCOUNTER — Other Ambulatory Visit: Payer: Medicaid Other
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
## 2019-11-30 ENCOUNTER — Ambulatory Visit: Payer: Medicaid Other | Admitting: Neurology

## 2019-11-30 ENCOUNTER — Encounter: Payer: Self-pay | Admitting: Neurology

## 2020-01-04 ENCOUNTER — Inpatient Hospital Stay (HOSPITAL_COMMUNITY)
Admission: EM | Admit: 2020-01-04 | Discharge: 2020-01-08 | DRG: 177 | Disposition: A | Discharge status: Home / Self Care | Payer: Medicaid Other | Attending: Internal Medicine | Admitting: Internal Medicine

## 2020-01-04 ENCOUNTER — Emergency Department (HOSPITAL_COMMUNITY): Payer: Medicaid Other

## 2020-01-04 ENCOUNTER — Other Ambulatory Visit: Payer: Self-pay

## 2020-01-04 ENCOUNTER — Encounter (HOSPITAL_COMMUNITY): Payer: Self-pay | Admitting: *Deleted

## 2020-01-04 DIAGNOSIS — Z86718 Personal history of other venous thrombosis and embolism: Secondary | ICD-10-CM

## 2020-01-04 DIAGNOSIS — Z91041 Radiographic dye allergy status: Secondary | ICD-10-CM

## 2020-01-04 DIAGNOSIS — Z7289 Other problems related to lifestyle: Secondary | ICD-10-CM | POA: Diagnosis not present

## 2020-01-04 DIAGNOSIS — J189 Pneumonia, unspecified organism: Secondary | ICD-10-CM | POA: Diagnosis not present

## 2020-01-04 DIAGNOSIS — J9601 Acute respiratory failure with hypoxia: Secondary | ICD-10-CM | POA: Diagnosis present

## 2020-01-04 DIAGNOSIS — F1010 Alcohol abuse, uncomplicated: Secondary | ICD-10-CM | POA: Diagnosis present

## 2020-01-04 DIAGNOSIS — J441 Chronic obstructive pulmonary disease with (acute) exacerbation: Secondary | ICD-10-CM | POA: Diagnosis present

## 2020-01-04 DIAGNOSIS — Z825 Family history of asthma and other chronic lower respiratory diseases: Secondary | ICD-10-CM | POA: Diagnosis not present

## 2020-01-04 DIAGNOSIS — R7303 Prediabetes: Secondary | ICD-10-CM | POA: Diagnosis present

## 2020-01-04 DIAGNOSIS — Z79899 Other long term (current) drug therapy: Secondary | ICD-10-CM | POA: Diagnosis not present

## 2020-01-04 DIAGNOSIS — K219 Gastro-esophageal reflux disease without esophagitis: Secondary | ICD-10-CM | POA: Diagnosis present

## 2020-01-04 DIAGNOSIS — I252 Old myocardial infarction: Secondary | ICD-10-CM | POA: Diagnosis not present

## 2020-01-04 DIAGNOSIS — Z716 Tobacco abuse counseling: Secondary | ICD-10-CM

## 2020-01-04 DIAGNOSIS — Z888 Allergy status to other drugs, medicaments and biological substances status: Secondary | ICD-10-CM

## 2020-01-04 DIAGNOSIS — F17210 Nicotine dependence, cigarettes, uncomplicated: Secondary | ICD-10-CM | POA: Diagnosis present

## 2020-01-04 DIAGNOSIS — U071 COVID-19: Principal | ICD-10-CM | POA: Diagnosis present

## 2020-01-04 DIAGNOSIS — J440 Chronic obstructive pulmonary disease with acute lower respiratory infection: Secondary | ICD-10-CM | POA: Diagnosis present

## 2020-01-04 DIAGNOSIS — F1910 Other psychoactive substance abuse, uncomplicated: Secondary | ICD-10-CM | POA: Diagnosis present

## 2020-01-04 DIAGNOSIS — Z8673 Personal history of transient ischemic attack (TIA), and cerebral infarction without residual deficits: Secondary | ICD-10-CM

## 2020-01-04 DIAGNOSIS — Z809 Family history of malignant neoplasm, unspecified: Secondary | ICD-10-CM

## 2020-01-04 DIAGNOSIS — Z7952 Long term (current) use of systemic steroids: Secondary | ICD-10-CM

## 2020-01-04 DIAGNOSIS — J1282 Pneumonia due to coronavirus disease 2019: Secondary | ICD-10-CM | POA: Diagnosis present

## 2020-01-04 DIAGNOSIS — I10 Essential (primary) hypertension: Secondary | ICD-10-CM | POA: Diagnosis present

## 2020-01-04 LAB — CBC WITH DIFFERENTIAL/PLATELET
Abs Immature Granulocytes: 0.04 10*3/uL (ref 0.00–0.07)
Abs Immature Granulocytes: 0.07 10*3/uL (ref 0.00–0.07)
Basophils Absolute: 0 10*3/uL (ref 0.0–0.1)
Basophils Absolute: 0 10*3/uL (ref 0.0–0.1)
Basophils Relative: 0 %
Basophils Relative: 0 %
Eosinophils Absolute: 0.1 10*3/uL (ref 0.0–0.5)
Eosinophils Absolute: 0 10*3/uL (ref 0.0–0.5)
Eosinophils Relative: 1 %
Eosinophils Relative: 0 %
HCT: 42.6 % (ref 39.0–52.0)
HCT: 41.6 % (ref 39.0–52.0)
Hemoglobin: 14.2 g/dL (ref 13.0–17.0)
Hemoglobin: 13.9 g/dL (ref 13.0–17.0)
Immature Granulocytes: 1 %
Immature Granulocytes: 1 %
Lymphocytes Relative: 10 %
Lymphocytes Relative: 4 %
Lymphs Abs: 0.8 10*3/uL (ref 0.7–4.0)
Lymphs Abs: 0.5 10*3/uL — ABNORMAL LOW (ref 0.7–4.0)
MCH: 32 pg (ref 26.0–34.0)
MCH: 32.2 pg (ref 26.0–34.0)
MCHC: 33.3 g/dL (ref 30.0–36.0)
MCHC: 33.4 g/dL (ref 30.0–36.0)
MCV: 95.9 fL (ref 80.0–100.0)
MCV: 96.3 fL (ref 80.0–100.0)
Monocytes Absolute: 0.5 10*3/uL (ref 0.1–1.0)
Monocytes Absolute: 0.4 10*3/uL (ref 0.1–1.0)
Monocytes Relative: 7 %
Monocytes Relative: 4 %
Neutro Abs: 6.1 10*3/uL (ref 1.7–7.7)
Neutro Abs: 9.3 10*3/uL — ABNORMAL HIGH (ref 1.7–7.7)
Neutrophils Relative %: 81 %
Neutrophils Relative %: 91 %
Platelets: 176 10*3/uL (ref 150–400)
Platelets: 200 10*3/uL (ref 150–400)
RBC: 4.44 MIL/uL (ref 4.22–5.81)
RBC: 4.32 MIL/uL (ref 4.22–5.81)
RDW: 14.4 % (ref 11.5–15.5)
RDW: 14.4 % (ref 11.5–15.5)
WBC: 7.5 10*3/uL (ref 4.0–10.5)
WBC: 10.2 10*3/uL (ref 4.0–10.5)
nRBC: 0 % (ref 0.0–0.2)
nRBC: 0 % (ref 0.0–0.2)

## 2020-01-04 LAB — COMPREHENSIVE METABOLIC PANEL
ALT: 21 U/L (ref 0–44)
ALT: 23 U/L (ref 0–44)
AST: 23 U/L (ref 15–41)
AST: 24 U/L (ref 15–41)
Albumin: 3.8 g/dL (ref 3.5–5.0)
Albumin: 3.9 g/dL (ref 3.5–5.0)
Alkaline Phosphatase: 69 U/L (ref 38–126)
Alkaline Phosphatase: 67 U/L (ref 38–126)
Anion gap: 13 (ref 5–15)
Anion gap: 15 (ref 5–15)
BUN: 9 mg/dL (ref 8–23)
BUN: 8 mg/dL (ref 8–23)
CO2: 17 mmol/L — ABNORMAL LOW (ref 22–32)
CO2: 17 mmol/L — ABNORMAL LOW (ref 22–32)
Calcium: 8.9 mg/dL (ref 8.9–10.3)
Calcium: 8.8 mg/dL — ABNORMAL LOW (ref 8.9–10.3)
Chloride: 107 mmol/L (ref 98–111)
Chloride: 106 mmol/L (ref 98–111)
Creatinine, Ser: 0.99 mg/dL (ref 0.61–1.24)
Creatinine, Ser: 0.92 mg/dL (ref 0.61–1.24)
GFR calc Af Amer: >60 mL/min (ref >60)
GFR calc Af Amer: >60 mL/min (ref >60)
GFR calc non Af Amer: >60 mL/min (ref >60)
GFR calc non Af Amer: >60 mL/min (ref >60)
Glucose, Bld: 128 mg/dL — ABNORMAL HIGH (ref 70–99)
Glucose, Bld: 107 mg/dL — ABNORMAL HIGH (ref 70–99)
Potassium: 4.3 mmol/L (ref 3.5–5.1)
Potassium: 4.2 mmol/L (ref 3.5–5.1)
Sodium: 137 mmol/L (ref 135–145)
Sodium: 138 mmol/L (ref 135–145)
Total Bilirubin: 0.6 mg/dL (ref 0.3–1.2)
Total Bilirubin: 0.7 mg/dL (ref 0.3–1.2)
Total Protein: 7.2 g/dL (ref 6.5–8.1)
Total Protein: 7.6 g/dL (ref 6.5–8.1)

## 2020-01-04 LAB — FERRITIN: Ferritin: 148 ng/mL (ref 24–336)

## 2020-01-04 LAB — PROCALCITONIN: Procalcitonin: <0.1 ng/mL

## 2020-01-04 LAB — ABO/RH: ABO/RH(D): O POS

## 2020-01-04 LAB — LACTATE DEHYDROGENASE: LDH: 191 U/L (ref 98–192)

## 2020-01-04 LAB — HIV ANTIBODY (ROUTINE TESTING W REFLEX): HIV Screen 4th Generation wRfx: NONREACTIVE

## 2020-01-04 LAB — RESPIRATORY PANEL BY RT PCR (FLU A&B, COVID)
Influenza A by PCR: NEGATIVE
Influenza B by PCR: NEGATIVE
SARS Coronavirus 2 by RT PCR: POSITIVE — AB

## 2020-01-04 LAB — D-DIMER, QUANTITATIVE: D-Dimer, Quant: 2 ug/mL-FEU — ABNORMAL HIGH (ref 0.00–0.50)

## 2020-01-04 LAB — C-REACTIVE PROTEIN: CRP: 1.7 mg/dL — ABNORMAL HIGH (ref <1.0)

## 2020-01-04 LAB — POC SARS CORONAVIRUS 2 AG -  ED: SARS Coronavirus 2 Ag: NEGATIVE

## 2020-01-04 LAB — LACTIC ACID, PLASMA
Lactic Acid, Venous: 2.3 mmol/L (ref 0.5–1.9)
Lactic Acid, Venous: 2.6 mmol/L (ref 0.5–1.9)

## 2020-01-04 LAB — FIBRINOGEN: Fibrinogen: 532 mg/dL — ABNORMAL HIGH (ref 210–475)

## 2020-01-04 LAB — MAGNESIUM: Magnesium: 1.6 mg/dL — ABNORMAL LOW (ref 1.7–2.4)

## 2020-01-04 LAB — PHOSPHORUS: Phosphorus: 2.6 mg/dL (ref 2.5–4.6)

## 2020-01-04 LAB — TROPONIN I (HIGH SENSITIVITY)
Troponin I (High Sensitivity): 9 ng/L (ref <18)
Troponin I (High Sensitivity): 7 ng/L (ref <18)

## 2020-01-04 LAB — BRAIN NATRIURETIC PEPTIDE: B Natriuretic Peptide: 49 pg/mL (ref 0.0–100.0)

## 2020-01-04 LAB — TRIGLYCERIDES: Triglycerides: 72 mg/dL (ref <150)

## 2020-01-04 MED ORDER — SODIUM CHLORIDE 0.9 % IV SOLN: 200.0000 mg | Freq: Once | INTRAVENOUS | Status: DC

## 2020-01-04 MED ORDER — ENOXAPARIN SODIUM 40 MG/0.4ML ~~LOC~~ SOLN
40.0000 mg | SUBCUTANEOUS | Status: DC
  Administered 2020-01-04 – 2020-01-07 (×4): 40 mg via SUBCUTANEOUS
  Filled 2020-01-04 (×4): qty 0.4

## 2020-01-04 MED ORDER — GUAIFENESIN 200 MG PO TABS
400.0000 mg | ORAL_TABLET | ORAL | Status: DC | PRN
  Administered 2020-01-06: 400 mg via ORAL
  Filled 2020-01-04 (×4): qty 2

## 2020-01-04 MED ORDER — SODIUM CHLORIDE 0.9 % IV SOLN
100.0000 mg | Freq: Once | INTRAVENOUS | Status: AC
  Administered 2020-01-04: 100 mg via INTRAVENOUS
  Filled 2020-01-04: qty 20

## 2020-01-04 MED ORDER — ACETAMINOPHEN 325 MG PO TABS
650.0000 mg | ORAL_TABLET | Freq: Once | ORAL | Status: AC
  Administered 2020-01-04: 650 mg via ORAL
  Filled 2020-01-04: qty 2

## 2020-01-04 MED ORDER — SODIUM CHLORIDE 0.9 % IV SOLN
100.0000 mg | Freq: Every day | INTRAVENOUS | Status: AC
  Administered 2020-01-05 – 2020-01-08 (×4): 100 mg via INTRAVENOUS
  Filled 2020-01-04 (×4): qty 20

## 2020-01-04 MED ORDER — SODIUM CHLORIDE 0.9 % IV SOLN
100.0000 mg | INTRAVENOUS | Status: AC
  Administered 2020-01-04: 100 mg via INTRAVENOUS
  Filled 2020-01-04 (×3): qty 20

## 2020-01-04 MED ORDER — SODIUM CHLORIDE 0.9 % IV BOLUS
500.0000 mL | Freq: Once | INTRAVENOUS | Status: AC
  Administered 2020-01-04: 500 mL via INTRAVENOUS

## 2020-01-04 MED ORDER — DEXAMETHASONE SODIUM PHOSPHATE 10 MG/ML IJ SOLN
10.0000 mg | Freq: Once | INTRAMUSCULAR | Status: AC
  Administered 2020-01-04: 10 mg via INTRAVENOUS
  Filled 2020-01-04: qty 1

## 2020-01-04 MED ORDER — FLUTICASONE FUROATE-VILANTEROL 200-25 MCG/INH IN AEPB
1.0000 | INHALATION_SPRAY | Freq: Every day | RESPIRATORY_TRACT | Status: DC
  Filled 2020-01-04: qty 28

## 2020-01-04 MED ORDER — NICOTINE 21 MG/24HR TD PT24
21.0000 mg | MEDICATED_PATCH | Freq: Every day | TRANSDERMAL | Status: DC
  Administered 2020-01-04 – 2020-01-08 (×5): 21 mg via TRANSDERMAL
  Filled 2020-01-04 (×5): qty 1

## 2020-01-04 MED ORDER — PREDNISONE 20 MG PO TABS: 40.0000 mg | ORAL_TABLET | Freq: Every day | ORAL | Status: DC

## 2020-01-04 MED ORDER — AZITHROMYCIN 250 MG PO TABS: 500.0000 mg | ORAL_TABLET | Freq: Every day | ORAL | Status: DC

## 2020-01-04 MED ORDER — ADULT MULTIVITAMIN W/MINERALS CH
1.0000 | ORAL_TABLET | Freq: Every day | ORAL | Status: DC
  Administered 2020-01-04 – 2020-01-08 (×5): 1 via ORAL
  Filled 2020-01-04 (×5): qty 1

## 2020-01-04 MED ORDER — THIAMINE HCL 100 MG PO TABS
100.0000 mg | ORAL_TABLET | Freq: Every day | ORAL | Status: DC
  Administered 2020-01-04 – 2020-01-08 (×5): 100 mg via ORAL
  Filled 2020-01-04 (×5): qty 1

## 2020-01-04 MED ORDER — ALBUTEROL SULFATE HFA 108 (90 BASE) MCG/ACT IN AERS
2.0000 | INHALATION_SPRAY | Freq: Once | RESPIRATORY_TRACT | Status: AC
  Administered 2020-01-04: 2 via RESPIRATORY_TRACT
  Filled 2020-01-04: qty 6.7

## 2020-01-04 MED ORDER — IPRATROPIUM-ALBUTEROL 0.5-2.5 (3) MG/3ML IN SOLN: 3.0000 mL | Freq: Four times a day (QID) | RESPIRATORY_TRACT | Status: DC | PRN

## 2020-01-04 MED ORDER — LORAZEPAM 2 MG/ML IJ SOLN: 1.0000 mg | INTRAMUSCULAR | Status: DC | PRN

## 2020-01-04 MED ORDER — DEXAMETHASONE 6 MG PO TABS
6.0000 mg | ORAL_TABLET | ORAL | Status: DC
  Administered 2020-01-04 – 2020-01-08 (×4): 6 mg via ORAL
  Filled 2020-01-04 (×4): qty 1

## 2020-01-04 MED ORDER — IPRATROPIUM-ALBUTEROL 0.5-2.5 (3) MG/3ML IN SOLN
3.0000 mL | Freq: Four times a day (QID) | RESPIRATORY_TRACT | Status: DC
  Administered 2020-01-04 – 2020-01-05 (×2): 3 mL via RESPIRATORY_TRACT
  Filled 2020-01-04 (×2): qty 3

## 2020-01-04 MED ORDER — SODIUM CHLORIDE 0.9 % IV SOLN: 1.0000 g | INTRAVENOUS | Status: DC

## 2020-01-04 MED ORDER — THIAMINE HCL 100 MG/ML IJ SOLN: 100.0000 mg | Freq: Every day | INTRAMUSCULAR | Status: DC

## 2020-01-04 MED ORDER — FOLIC ACID 1 MG PO TABS
1.0000 mg | ORAL_TABLET | Freq: Every day | ORAL | Status: DC
  Administered 2020-01-04 – 2020-01-08 (×5): 1 mg via ORAL
  Filled 2020-01-04 (×5): qty 1

## 2020-01-04 MED ORDER — FOLIC ACID 1 MG PO TABS: 1.0000 mg | ORAL_TABLET | Freq: Every day | ORAL | Status: DC

## 2020-01-04 MED ORDER — SODIUM CHLORIDE 0.9 % IV SOLN
500.0000 mg | Freq: Once | INTRAVENOUS | Status: AC
  Administered 2020-01-04: 500 mg via INTRAVENOUS
  Filled 2020-01-04: qty 500

## 2020-01-04 MED ORDER — LORAZEPAM 1 MG PO TABS: 1.0000 mg | ORAL_TABLET | ORAL | Status: DC | PRN

## 2020-01-04 MED ORDER — SODIUM CHLORIDE 0.9 % IV SOLN: 100.0000 mg | Freq: Every day | INTRAVENOUS | Status: DC

## 2020-01-04 MED ORDER — SODIUM CHLORIDE 0.9 % IV SOLN
1.0000 g | Freq: Once | INTRAVENOUS | Status: AC
  Administered 2020-01-04: 1 g via INTRAVENOUS
  Filled 2020-01-04: qty 10

## 2020-01-04 NOTE — ED Notes (Signed)
Dinner Tray Ordered @ 1721.

## 2020-01-04 NOTE — ED Notes (Signed)
Provided pt a sandwich and coke. Pt tolerated well.

## 2020-01-04 NOTE — ED Triage Notes (Signed)
Patient presents to ed via [facility] from home states he went out with friends last pm was feeling ok this am when he woke up was having fever chills nausea and vomiting. C/o sob. Right flank pain.

## 2020-01-04 NOTE — H&P (Addendum)
History and Physical    Brent Byrd [id number] DOB: 1956-08-09 DOA: 01/04/2020  PCP: Nolene Ebbs, MD   Patient coming from: Home  I have personally briefly reviewed patient's old medical records in [place]  Chief Complaint: Cough, SOB  HPI: Brent Byrd is a 64 y.o. male with medical history significant of alcohol abuse, COPD, hypertension, previous stroke, DVT, presented with worsening of cough and SOB for one month and fevers and chills for one day.  Cough has been productive with greenish sputums, gradually getting worse. But not fever or chills until yesterday. Claims he does binge drinking weekly about 6 pack beer every time and last drink was last week. He woke up this morning feeling nauseous and vomited once with stomach content and started to feel more SOB. Then he called EMS, who found pt was 88% on RA, then put on 2 L nasal cannula.  Patient is not on oxygen at baseline.  Patient was also noted to be tachycardic and have some T wave inversions on the lateral leads.  Denies any chest pain.  Denies any known exposure to Covid. No chest pain, no abd pain, no diarrhea, no muscle aching, no loss of taste. ED Course: Rapid COVID negative, Xray showing b/l lower field infiltrates. ABX started and confirmation COVID sent. Fever 101.  Review of Systems: As per HPI otherwise 10 point review of systems negative.    Past Medical History:  Diagnosis Date  . Alcohol abuse   . Arthritis    Left knee, lower back  . Chest pain    hx  . COPD (chronic obstructive pulmonary disease) ([place])    "[place] said I don't have this" (10/26/2016)  . Depression    no meds  . DVT (deep venous thrombosis) ([place]) 2008   Right leg- txed with coumadin   . DVT (deep venous thrombosis) ([place]) 2010; 2017   RLE; RLE  . Gastroesophageal reflux disease 2014  . Hypertension     per patiente-controlled, was taken off BP med  . MI, old ~ 2007  . Stroke ([place]) 01/2012   denies residual on  10/26/2016  . Substance abuse ([place])    Alcohol dependence Hx  . Tobacco abuse     Past Surgical History:  Procedure Laterality Date  . BACK SURGERY    . GANGLION CYST EXCISION Left   . HAMMER TOE SURGERY Left 07/2012   2nd toe/08/05/2012  . LUMBAR DISC SURGERY  06/2007   Herniated disc/notes 02/25/2011  . WISDOM TOOTH EXTRACTION       reports that he has been smoking cigarettes. He has a 22.50 pack-year smoking history. He has never used smokeless tobacco. He reports current alcohol use of about 1.0 standard drinks of alcohol per week. He reports previous drug use. Drugs: "Crack" cocaine and Cocaine.  Allergies  Allergen Reactions  . Iodine Hives and Other (See Comments)  . Omnipaque [Iohexol] Other (See Comments)    IV Dye-Sneezing    Family History  Problem Relation Age of Onset  . Asthma Mother   . Cancer Father        Lung  . Cancer Sister   . Thrombosis Sister   . Diabetes Sister   . Colon cancer Neg Hx   . Rectal cancer Neg Hx   . Stomach cancer Neg Hx   . Esophageal cancer Neg Hx      Prior to Admission medications   Medication Sig Start Date End Date Taking? Authorizing Provider  ALPRAZolam (  XANAX) 0.5 MG tablet Take 2 tablets approximately 45 minutes prior to the MRI study, take a third tablet if needed. 09/20/19   Kathrynn Ducking, MD  folic acid (FOLVITE) 1 MG tablet Take 1 tablet (1 mg total) by mouth daily. 04/13/16   Thurnell Lose, MD  gabapentin (NEURONTIN) 600 MG tablet Take 1 tablet (600 mg total) by mouth 3 (three) times daily. 09/23/19   Kathrynn Ducking, MD  naproxen sodium (ALEVE) 220 MG tablet Take 220 mg by mouth daily as needed (pain).    [provider]  nitroGLYCERIN (NITROSTAT) 0.4 MG SL tablet Place 0.4 mg under the tongue every 5 (five) minutes as needed for chest pain. Reported on 02/25/2016    [provider]  ondansetron (ZOFRAN ODT) 4 MG disintegrating tablet Take 1 tablet (4 mg total) by mouth every 8 (eight) hours as  needed for nausea or vomiting. 09/14/19   Long, Wonda Olds, MD  predniSONE (DELTASONE) 10 MG tablet Begin taking 6 tablets daily, taper by one tablet every other day until off the medication. 09/20/19   Kathrynn Ducking, MD  sildenafil (REVATIO) 20 MG tablet SMARTSIG:3-5 Tablet(s) By Mouth Daily 06/13/19   [provider]  [facility] 160-4.5 MCG/ACT inhaler SMARTSIG:2 Puff(s) By Mouth Twice Daily 06/10/19   [provider]  thiamine (VITAMIN B-1) 100 MG tablet Take 1 tablet (100 mg total) by mouth daily. 04/13/16   Thurnell Lose, MD    Physical Exam: Vitals:   01/04/20 1036 01/04/20 1045 01/04/20 1056 01/04/20 1057  BP: 114/69 128/88    Pulse: (!) 113 (!) 114    Resp: (!) 34 (!) 37    Temp: 99.9 F (37.7 C)     TempSrc: Oral     SpO2: 91% 93% 91%   Weight: 120.7 kg   121.1 kg  Height: 6\' 4"  (1.93 m)   6\' 4"  (1.93 m)    Constitutional: NAD, calm, comfortable Vitals:   01/04/20 1036 01/04/20 1045 01/04/20 1056 01/04/20 1057  BP: 114/69 128/88    Pulse: (!) 113 (!) 114    Resp: (!) 34 (!) 37    Temp: 99.9 F (37.7 C)     TempSrc: Oral     SpO2: 91% 93% 91%   Weight: 120.7 kg   121.1 kg  Height: 6\' 4"  (1.93 m)   6\' 4"  (1.93 m)   Eyes: PERRL, lids and conjunctivae normal ENMT: Mucous membranes are moist. Posterior pharynx clear of any exudate or lesions.Normal dentition.  Neck: normal, supple, no masses, no thyromegaly Respiratory: scattered wheezing, crackles on right lower field. increased respiratory effort. No accessory muscle use.  Cardiovascular: Regular rate and rhythm, no murmurs / rubs / gallops. No extremity edema. 2+ pedal pulses. No carotid bruits.  Abdomen: no tenderness, no masses palpated. No hepatosplenomegaly. Bowel sounds positive.  Musculoskeletal: no clubbing / cyanosis. No joint deformity upper and lower extremities. Good ROM, no contractures. Normal muscle tone.  Skin: no rashes, lesions, ulcers. No induration Neurologic: CN 2-12 grossly  intact. Sensation intact, DTR normal. Strength 5/5 in all 4.  Psychiatric: Normal judgment and insight. Alert and oriented x 3. Normal mood.     Labs on Admission: I have personally reviewed following labs and imaging studies  CBC: Recent Labs  Lab 01/04/20 1042  WBC 7.5  NEUTROABS 6.1  HGB 14.2  HCT 42.6  MCV 95.9  PLT 0000000   Basic Metabolic Panel: Recent Labs  Lab 01/04/20 1042  NA 137  K 4.3  CL 107  CO2 17*  GLUCOSE 128*  BUN 9  CREATININE 0.99  CALCIUM 8.9   GFR: Estimated Creatinine Clearance: 108.6 mL/min (by C-G formula based on SCr of 0.99 mg/dL). Liver Function Tests: Recent Labs  Lab 01/04/20 1042  AST 23  ALT 21  ALKPHOS 69  BILITOT 0.6  PROT 7.2  ALBUMIN 3.8   No results for input(s): LIPASE, AMYLASE in the last 168 hours. No results for input(s): AMMONIA in the last 168 hours. Coagulation Profile: No results for input(s): INR, PROTIME in the last 168 hours. Cardiac Enzymes: No results for input(s): CKTOTAL, CKMB, CKMBINDEX, TROPONINI in the last 168 hours. BNP (last 3 results) No results for input(s): PROBNP in the last 8760 hours. HbA1C: No results for input(s): HGBA1C in the last 72 hours. CBG: No results for input(s): GLUCAP in the last 168 hours. Lipid Profile: Recent Labs    01/04/20 1113  TRIG 72   Thyroid Function Tests: No results for input(s): TSH, T4TOTAL, FREET4, T3FREE, THYROIDAB in the last 72 hours. Anemia Panel: No results for input(s): VITAMINB12, FOLATE, FERRITIN, TIBC, IRON, RETICCTPCT in the last 72 hours. Urine analysis:    Component Value Date/Time   COLORURINE COLORLESS (A) 05/18/2019 1949   APPEARANCEUR CLEAR 05/18/2019 1949   APPEARANCEUR Hazy 07/30/2014 0850   LABSPEC 1.000 (L) 05/18/2019 1949   LABSPEC 1.015 07/30/2014 0850   PHURINE 6.0 05/18/2019 1949   GLUCOSEU NEGATIVE 05/18/2019 1949   GLUCOSEU Negative 07/30/2014 0850   HGBUR NEGATIVE 05/18/2019 1949   BILIRUBINUR NEGATIVE 05/18/2019 1949    BILIRUBINUR Negative 07/30/2014 [place] 05/18/2019 1949   PROTEINUR NEGATIVE 05/18/2019 1949   NITRITE NEGATIVE 05/18/2019 1949   LEUKOCYTESUR NEGATIVE 05/18/2019 1949   LEUKOCYTESUR Negative 07/30/2014 0850    Radiological Exams on Admission: DG Chest Port 1 View  Result Date: 01/04/2020 CLINICAL DATA:  Shortness of breath and fever. EXAM: PORTABLE CHEST 1 VIEW COMPARISON:  Chest x-ray 05/19/2019 FINDINGS: The heart is borderline enlarged but stable. There is moderate tortuosity of the thoracic aorta. Underlying emphysematous changes and pulmonary scarring with suspected patchy bibasilar infiltrates. No definite pleural effusions. IMPRESSION: Underlying emphysematous changes and pulmonary scarring with suspected patchy bibasilar infiltrates. Electronically Signed   By: Marijo Sanes M.D.   On: 01/04/2020 12:07    EKG: Independently reviewed. T wave flattening/invertion on V5-6 which is chronic.  Assessment/Plan Active Problems:   Community acquired pneumonia   PNA (pneumonia)  Sepsis Evidenced by tachycardia, increasing breathing rate and elevated lactate. Source is COVID PNA, X-ray shows multifocal infiltrates B/L.  Mycoplasma Remdisivir and Steroid Proning as tolerated  Acute hypoxic respiratory failure Like from PNA and COPD  Acute COPD exacerbation Anti-viral and PO steroid He had a PFT in [facility], DLCO <40% and EF 25-75 65, probably mild to moderate COPD, no home O2.  Hx of DVT No chest pain, symptoms more compatible with PNA, repeat D-Dimer in AM  EtOH abuse No s/s of withdrawal CIWA protocol for now.  Nocotine dependence Educate pt to quit Nicotine patch    DVT prophylaxis: Lovenox Code Status:Full Family Communication: None at bedside Disposition Plan: Likely can go home in 1-2 days once hypoxia improves Consults called: None Admission status: tele admission   Lequita Halt MD [facility] [phone]    01/04/2020, 1:10 PM

## 2020-01-04 NOTE — Progress Notes (Signed)
Brent Byrd is a 64 y.o. male patient admitted from ED awake, alert - oriented  X 4 - no acute distress noted.  VSS - Blood pressure (!) 150/99, pulse 85, temperature 98.2 F (36.8 C), temperature source Oral, resp. rate (!) 23, height 6\' 4"  (1.93 m), weight 121.1 kg, SpO2 98 %.    IV in place, occlusive dsg intact without redness.  Orientation to room, and floor completed with information packet given to patient/family.  Patient declined safety video at this time.  Admission INP armband ID verified with patient/family, and in place.   Call light within reach, patient able to voice, and demonstrate understanding.  Skin, clean-dry- intact without evidence of bruising, or skin tears.   No evidence of skin break down noted on exam.      Dene Gentry, RN 01/04/2020 7:29 PM

## 2020-01-04 NOTE — ED Notes (Signed)
Patient sat decreased to 88% on RA, placed on [place] 2 liters increased sat to 91% Dr. Darl Householder aware.

## 2020-01-04 NOTE — ED Provider Notes (Signed)
[facility] EMERGENCY DEPARTMENT Provider Note   CSN: [id number] Arrival date & time: 01/04/20  1028     History Chief Complaint  Patient presents with  . Fever  . Shortness of Breath  . Emesis  . Nausea    Dysen Byrd is a 64 y.o. male history of alcohol abuse, COPD, hypertension, previous stroke, here with fevers and chills and shortness of breath.  Patient states that he went out for some beer with some friends yesterday.  He states that he woke up today with acute onset of shortness of breath.  He states that he had a hard time catching his breath.  He also states that he has some chills as well.  Patient was noted to be hypoxic to 88% per EMS and put on 2 L nasal cannula.  Patient is not on oxygen at baseline.  Patient was also noted to be tachycardic and have some T wave inversions on the lateral leads.  Denies any chest pain.  Denies any known exposure to Covid.  The history is provided by the patient.       Past Medical History:  Diagnosis Date  . Alcohol abuse   . Arthritis    Left knee, lower back  . Chest pain    hx  . COPD (chronic obstructive pulmonary disease) ([place])    "[place] said I don't have this" (10/26/2016)  . Depression    no meds  . DVT (deep venous thrombosis) ([place]) 2008   Right leg- txed with coumadin   . DVT (deep venous thrombosis) ([place]) 2010; 2017   RLE; RLE  . Gastroesophageal reflux disease 2014  . Hypertension     per patiente-controlled, was taken off BP med  . MI, old ~ 2007  . Stroke ([place]) 01/2012   denies residual on 10/26/2016  . Substance abuse ([place])    Alcohol dependence Hx  . Tobacco abuse     Patient Active Problem List   Diagnosis Date Noted  . CAP (community acquired pneumonia) 10/25/2016  . Acute respiratory failure with hypoxia ([place]) 10/25/2016  . Rhinitis, non-allergic 10/25/2016  . SOB (shortness of breath) 04/13/2016  . Hypotension 04/13/2016  . Acute respiratory distress 04/13/2016  .  Substance abuse ([place])   . Chest pain   . COPD (chronic obstructive pulmonary disease) ([place])   . Gastroesophageal reflux disease   . Hypertension   . Depression   . Tobacco abuse   . Alcohol abuse   . Cigarette smoker 05/18/2015  . Asthmatic bronchitis with acute exacerbation 05/17/2015    Past Surgical History:  Procedure Laterality Date  . BACK SURGERY    . GANGLION CYST EXCISION Left   . HAMMER TOE SURGERY Left 07/2012   2nd toe/08/05/2012  . LUMBAR DISC SURGERY  06/2007   Herniated disc/notes 02/25/2011  . WISDOM TOOTH EXTRACTION         Family History  Problem Relation Age of Onset  . Asthma Mother   . Cancer Father        Lung  . Cancer Sister   . Thrombosis Sister   . Diabetes Sister   . Colon cancer Neg Hx   . Rectal cancer Neg Hx   . Stomach cancer Neg Hx   . Esophageal cancer Neg Hx     Social History   Tobacco Use  . Smoking status: Current Every Day Smoker    Packs/day: 0.50    Years: 45.00    Pack years: 22.50  Types: Cigarettes  . Smokeless tobacco: Never Used  Substance Use Topics  . Alcohol use: Yes    Alcohol/week: 1.0 standard drinks    Types: 1 Cans of beer per week    Comment: daily  . Drug use: Not Currently    Types: "Crack" cocaine, Cocaine    Comment: 10/26/2016 "crack once q 2-3 months"    Home Medications Prior to Admission medications   Medication Sig Start Date End Date Taking? Authorizing Provider  ALPRAZolam Duanne Moron) 0.5 MG tablet Take 2 tablets approximately 45 minutes prior to the MRI study, take a third tablet if needed. 09/20/19   Kathrynn Ducking, MD  folic acid (FOLVITE) 1 MG tablet Take 1 tablet (1 mg total) by mouth daily. 04/13/16   Thurnell Lose, MD  gabapentin (NEURONTIN) 600 MG tablet Take 1 tablet (600 mg total) by mouth 3 (three) times daily. 09/23/19   Kathrynn Ducking, MD  naproxen sodium (ALEVE) 220 MG tablet Take 220 mg by mouth daily as needed (pain).    [provider]  nitroGLYCERIN  (NITROSTAT) 0.4 MG SL tablet Place 0.4 mg under the tongue every 5 (five) minutes as needed for chest pain. Reported on 02/25/2016    [provider]  ondansetron (ZOFRAN ODT) 4 MG disintegrating tablet Take 1 tablet (4 mg total) by mouth every 8 (eight) hours as needed for nausea or vomiting. 09/14/19   Long, Wonda Olds, MD  predniSONE (DELTASONE) 10 MG tablet Begin taking 6 tablets daily, taper by one tablet every other day until off the medication. 09/20/19   Kathrynn Ducking, MD  sildenafil (REVATIO) 20 MG tablet SMARTSIG:3-5 Tablet(s) By Mouth Daily 06/13/19   [provider]  [facility] 160-4.5 MCG/ACT inhaler SMARTSIG:2 Puff(s) By Mouth Twice Daily 06/10/19   [provider]  thiamine (VITAMIN B-1) 100 MG tablet Take 1 tablet (100 mg total) by mouth daily. 04/13/16   Thurnell Lose, MD    Allergies    Iodine and Omnipaque [iohexol]  Review of Systems   Review of Systems  Respiratory: Positive for cough and shortness of breath.   All other systems reviewed and are negative.   Physical Exam Updated Vital Signs BP 128/88   Pulse (!) 114   Temp 99.9 F (37.7 C) (Oral)   Resp (!) 37   Ht 6\' 4"  (1.93 m)   Wt 121.1 kg   SpO2 91%   BMI 32.50 kg/m   Physical Exam Vitals reviewed.  Constitutional:      Comments: Tachypneic, moderate distress   HENT:     Head: Normocephalic.     Nose: Nose normal.     Mouth/Throat:     Mouth: Mucous membranes are dry.  Eyes:     Extraocular Movements: Extraocular movements intact.     Pupils: Pupils are equal, round, and reactive to light.  Cardiovascular:     Rate and Rhythm: Regular rhythm. Tachycardia present.  Pulmonary:     Comments: Tachypneic, diffuse wheezing, no retractions  Abdominal:     General: Abdomen is flat.     Palpations: Abdomen is soft.  Musculoskeletal:        General: Normal range of motion.     Cervical back: Normal range of motion.  Skin:    General: Skin is warm.     Capillary Refill:  Capillary refill takes less than 2 seconds.  Neurological:     General: No focal deficit present.     Mental Status: He is oriented  to person, place, and time.  Psychiatric:        Mood and Affect: Mood normal.        Behavior: Behavior normal.     ED Results / Procedures / Treatments   Labs (all labs ordered are listed, but only abnormal results are displayed) Labs Reviewed  LACTIC ACID, PLASMA - Abnormal; Notable for the following components:      Result Value   Lactic Acid, Venous 2.3 (*)    All other components within normal limits  COMPREHENSIVE METABOLIC PANEL - Abnormal; Notable for the following components:   CO2 17 (*)    Glucose, Bld 128 (*)    All other components within normal limits  D-DIMER, QUANTITATIVE (NOT AT [facility]) - Abnormal; Notable for the following components:   D-Dimer, Quant 2.00 (*)    All other components within normal limits  FIBRINOGEN - Abnormal; Notable for the following components:   Fibrinogen 532 (*)    All other components within normal limits  CULTURE, BLOOD (ROUTINE X 2)  CULTURE, BLOOD (ROUTINE X 2)  RESPIRATORY PANEL BY RT PCR (FLU A&B, COVID)  CBC WITH DIFFERENTIAL/PLATELET  LACTATE DEHYDROGENASE  TRIGLYCERIDES  BRAIN NATRIURETIC PEPTIDE  LACTIC ACID, PLASMA  PROCALCITONIN  FERRITIN  C-REACTIVE PROTEIN  URINALYSIS, ROUTINE W REFLEX MICROSCOPIC  POC SARS CORONAVIRUS 2 AG -  ED  TROPONIN I (HIGH SENSITIVITY)  TROPONIN I (HIGH SENSITIVITY)    EKG EKG Interpretation  Date/Time:  Thursday January 04 2020 10:37:39 EST Ventricular Rate:  111 PR Interval:    QRS Duration: 91 QT Interval:  311 QTC Calculation: 423 R Axis:   -26 Text Interpretation: Sinus tachycardia Borderline left axis deviation Abnormal R-wave progression, early transition Probable anteroseptal infarct, old Repol abnrm suggests ischemia, lateral leads TWI unchanged Confirmed by Wandra Arthurs [id number]) on 01/04/2020 10:55:25 AM   Radiology DG Chest Port 1  View  Result Date: 01/04/2020 CLINICAL DATA:  Shortness of breath and fever. EXAM: PORTABLE CHEST 1 VIEW COMPARISON:  Chest x-ray 05/19/2019 FINDINGS: The heart is borderline enlarged but stable. There is moderate tortuosity of the thoracic aorta. Underlying emphysematous changes and pulmonary scarring with suspected patchy bibasilar infiltrates. No definite pleural effusions. IMPRESSION: Underlying emphysematous changes and pulmonary scarring with suspected patchy bibasilar infiltrates. Electronically Signed   By: Marijo Sanes M.D.   On: 01/04/2020 12:07    Procedures Procedures (including critical care time)  CRITICAL CARE Performed by: Wandra Arthurs   Total critical care time: 30 minutes  Critical care time was exclusive of separately billable procedures and treating other patients.  Critical care was necessary to treat or prevent imminent or life-threatening deterioration.  Critical care was time spent personally by me on the following activities: development of treatment plan with patient and/or surrogate as well as nursing, discussions with consultants, evaluation of patient's response to treatment, examination of patient, obtaining history from patient or surrogate, ordering and performing treatments and interventions, ordering and review of laboratory studies, ordering and review of radiographic studies, pulse oximetry and re-evaluation of patient's condition.   Medications Ordered in ED Medications  sodium chloride 0.9 % bolus 500 mL (has no administration in time range)  cefTRIAXone (ROCEPHIN) 1 g in sodium chloride 0.9 % 100 mL IVPB (has no administration in time range)  azithromycin (ZITHROMAX) 500 mg in sodium chloride 0.9 % 250 mL IVPB (has no administration in time range)  acetaminophen (TYLENOL) tablet 650 mg (650 mg Oral Given 01/04/20 1108)  albuterol (VENTOLIN HFA) 108 (  90 Base) MCG/ACT inhaler 2 puff (2 puffs Inhalation Given 01/04/20 1118)  dexamethasone (DECADRON)  injection 10 mg (10 mg Intravenous Given 01/04/20 1108)    ED Course  I have reviewed the triage vital signs and the nursing notes.  Pertinent labs & imaging results that were available during my care of the patient were reviewed by me and considered in my medical decision making (see chart for details).    MDM Rules/Calculators/A&P                      Hubbert Degraw is a 64 y.o. male here with cough, wheezing, hypoxia, fever. Concerned for pneumonia vs COPD vs COVID. Will get labs, COVID, [facility] preadmission orders, CXR.   12:20 PM COVID antigen negative. However, inflammatory markers elevated. CXR showed bibasilar infiltrates. I still think he likely has COVID. Given IV abx for possible CAP. Given that he is hypoxic to 88% per EMS, will admit for respiratory distress with hypoxia. [place] sent. Also given steroids, albuterol.   Final Clinical Impression(s) / ED Diagnoses Final diagnoses:  None    Rx / DC Orders ED Discharge Orders    None       Drenda Freeze, MD 01/04/20 1221

## 2020-01-04 NOTE — ED Notes (Signed)
Date and time results received: 01/04/20  1152  Test: lactic Critical Value: 2.3  Name of Provider Notified: d Darl Householder md  Orders Received? Or Actions Taken?: awaiting new orders

## 2020-01-05 DIAGNOSIS — U071 COVID-19: Principal | ICD-10-CM

## 2020-01-05 DIAGNOSIS — J9601 Acute respiratory failure with hypoxia: Secondary | ICD-10-CM

## 2020-01-05 LAB — CBC WITH DIFFERENTIAL/PLATELET
Abs Immature Granulocytes: 0.04 10*3/uL (ref 0.00–0.07)
Basophils Absolute: 0 10*3/uL (ref 0.0–0.1)
Basophils Relative: 0 %
Eosinophils Absolute: 0 10*3/uL (ref 0.0–0.5)
Eosinophils Relative: 0 %
HCT: 39.5 % (ref 39.0–52.0)
Hemoglobin: 13.5 g/dL (ref 13.0–17.0)
Immature Granulocytes: 0 %
Lymphocytes Relative: 8 %
Lymphs Abs: 1 10*3/uL (ref 0.7–4.0)
MCH: 32.3 pg (ref 26.0–34.0)
MCHC: 34.2 g/dL (ref 30.0–36.0)
MCV: 94.5 fL (ref 80.0–100.0)
Monocytes Absolute: 0.7 10*3/uL (ref 0.1–1.0)
Monocytes Relative: 6 %
Neutro Abs: 10.1 10*3/uL — ABNORMAL HIGH (ref 1.7–7.7)
Neutrophils Relative %: 86 %
Platelets: 186 10*3/uL (ref 150–400)
RBC: 4.18 MIL/uL — ABNORMAL LOW (ref 4.22–5.81)
RDW: 14.5 % (ref 11.5–15.5)
WBC: 11.9 10*3/uL — ABNORMAL HIGH (ref 4.0–10.5)
nRBC: 0 % (ref 0.0–0.2)

## 2020-01-05 LAB — COMPREHENSIVE METABOLIC PANEL
ALT: 21 U/L (ref 0–44)
AST: 19 U/L (ref 15–41)
Albumin: 3.6 g/dL (ref 3.5–5.0)
Alkaline Phosphatase: 67 U/L (ref 38–126)
Anion gap: 11 (ref 5–15)
BUN: 16 mg/dL (ref 8–23)
CO2: 21 mmol/L — ABNORMAL LOW (ref 22–32)
Calcium: 9.1 mg/dL (ref 8.9–10.3)
Chloride: 104 mmol/L (ref 98–111)
Creatinine, Ser: 0.94 mg/dL (ref 0.61–1.24)
GFR calc Af Amer: >60 mL/min (ref >60)
GFR calc non Af Amer: >60 mL/min (ref >60)
Glucose, Bld: 140 mg/dL — ABNORMAL HIGH (ref 70–99)
Potassium: 4 mmol/L (ref 3.5–5.1)
Sodium: 136 mmol/L (ref 135–145)
Total Bilirubin: 0.1 mg/dL — ABNORMAL LOW (ref 0.3–1.2)
Total Protein: 7.3 g/dL (ref 6.5–8.1)

## 2020-01-05 LAB — C-REACTIVE PROTEIN: CRP: 11.2 mg/dL — ABNORMAL HIGH (ref <1.0)

## 2020-01-05 LAB — MAGNESIUM: Magnesium: 1.9 mg/dL (ref 1.7–2.4)

## 2020-01-05 LAB — FERRITIN: Ferritin: 135 ng/mL (ref 24–336)

## 2020-01-05 LAB — D-DIMER, QUANTITATIVE: D-Dimer, Quant: 2.85 ug/mL-FEU — ABNORMAL HIGH (ref 0.00–0.50)

## 2020-01-05 LAB — PHOSPHORUS: Phosphorus: 2.2 mg/dL — ABNORMAL LOW (ref 2.5–4.6)

## 2020-01-05 MED ORDER — CHLORDIAZEPOXIDE HCL 5 MG PO CAPS: 10.0000 mg | ORAL_CAPSULE | Freq: Four times a day (QID) | ORAL | Status: DC

## 2020-01-05 MED ORDER — FLUTICASONE FUROATE-VILANTEROL 200-25 MCG/INH IN AEPB
1.0000 | INHALATION_SPRAY | Freq: Every day | RESPIRATORY_TRACT | Status: DC
  Administered 2020-01-05 – 2020-01-08 (×4): 1 via RESPIRATORY_TRACT

## 2020-01-05 MED ORDER — ALBUTEROL SULFATE HFA 108 (90 BASE) MCG/ACT IN AERS
2.0000 | INHALATION_SPRAY | Freq: Four times a day (QID) | RESPIRATORY_TRACT | Status: DC
  Administered 2020-01-05 – 2020-01-08 (×13): 2 via RESPIRATORY_TRACT
  Filled 2020-01-05: qty 6.7

## 2020-01-05 MED ORDER — CHLORDIAZEPOXIDE HCL 5 MG PO CAPS
10.0000 mg | ORAL_CAPSULE | Freq: Four times a day (QID) | ORAL | Status: DC
  Filled 2020-01-05: qty 2

## 2020-01-05 NOTE — Progress Notes (Addendum)
Patient's daughter, Berenice Primas was called with updates. Patient is alert and oriented and he was ok with Probation officer to call his daughter. All questions were answered. Will continue to monitor.

## 2020-01-05 NOTE — Progress Notes (Signed)
PROGRESS NOTE                                                                                                                                                                                                             Patient Demographics:    Brent Byrd, is a 64 y.o. male, DOB - 1956/02/13, [id number]  Admit date - 01/04/2020   Admitting Physician Lequita Halt, MD  Outpatient Primary MD for the patient is Nolene Ebbs, MD  LOS - 1   Chief Complaint  Patient presents with  . Fever  . Shortness of Breath  . Emesis  . Nausea       Brief Narrative    HPI: Brent Byrd is a 64 y.o. male with medical history significant of alcohol abuse, COPD, hypertension, previous stroke, DVT, presented with worsening of cough and SOB for one month and fevers and chills for one day. Cough has been productive with greenish sputums, gradually getting worse. But not fever or chills until yesterday. Claims he does binge drinking weekly about 6 pack beer every time and last drink was last week. He woke up this morning feeling nauseous and vomited once with stomach content and started to feel more SOB. Then he called EMS, who found pt was 88% on RA, then put on 2 L nasal cannula. Patient is not on oxygen at baseline. Patient was also noted to be tachycardic and have some T wave inversions on the lateral leads. Denies any chest pain. Denies any known exposure to Covid. No chest pain, no abd pain, no diarrhea, no muscle aching, no loss of taste. ED Course: Rapid COVID negative, Xray showing b/l lower field infiltrates. ABX started and confirmation COVID sent. Fever 101.    Subjective:    Brent Byrd today has, No headache, No chest pain, No abdominal pain, patient reports some fever and chills overnight, some dyspnea, and productive cough.    Assessment  & Plan :    Active Problems:   Community acquired pneumonia   PNA (pneumonia)   COVID-19   Acute  hypoxic respiratory failure due to COVID-19 pneumonia -This x-ray significant for multifocal opacity in bilateral bases, he presents with fever. -Continue with IV steroids. -Continue with IV remdesivir. -Tinea to trend inflammatory markers. -Remains on 2 L nasal cannula, he was encouraged with incentive spirometry, flutter  valve, out of bed to chair and to prone.  Acute COPD exacerbation -Continue with steroids. He had a PFT in 2015 at [facility], DLCO <40% and EF 25-75 65, probably mild to moderate COPD, no home O2.  Hx of DVT -He is not on anticoagulation anymore, D-dimers most likely elevating in the setting of COVID-19 pneumonia  EtOH abuse No s/s of withdrawal CIWA protocol for now.  Nocotine dependence Educate pt to quit Nicotine patch   COVID-19 Labs  Recent Labs    01/04/20 1042 01/05/20 0312  DDIMER 2.00* 2.85*  FERRITIN 148 135  LDH 191  --   CRP 1.7* 11.2*    Lab Results  Component Value Date   SARSCOV2NAA POSITIVE (A) 01/04/2020     Code Status : Full  Disposition Plan  : Home when stable, still requiring oxygen, and IV medications  Consults  :  None  Procedures  : None  DVT Prophylaxis  :  [place] lovenox  Lab Results  Component Value Date   PLT 186 01/05/2020    Antibiotics  :   Anti-infectives (From admission, onward)   Start     Dose/Rate Route Frequency Ordered Stop   01/05/20 1400  azithromycin (ZITHROMAX) tablet 500 mg  Status:  Discontinued     500 mg Oral Daily 01/04/20 1319 01/04/20 1500   01/05/20 1300  cefTRIAXone (ROCEPHIN) 1 g in sodium chloride 0.9 % 100 mL IVPB  Status:  Discontinued     1 g 200 mL/hr over 30 Minutes Intravenous Every 24 hours 01/04/20 1319 01/04/20 1458   01/05/20 1000  remdesivir 100 mg in sodium chloride 0.9 % 100 mL IVPB  Status:  Discontinued     100 mg 200 mL/hr over 30 Minutes Intravenous Daily 01/04/20 1500 01/04/20 1504   01/05/20 1000  remdesivir 100 mg in sodium chloride 0.9 % 100 mL IVPB      100 mg 200 mL/hr over 30 Minutes Intravenous Daily 01/04/20 1504 01/09/20 0959   01/04/20 2300  remdesivir 100 mg in sodium chloride 0.9 % 100 mL IVPB     100 mg 200 mL/hr over 30 Minutes Intravenous Once 01/04/20 2235 01/05/20 0011   01/04/20 1515  remdesivir 100 mg in sodium chloride 0.9 % 100 mL IVPB     100 mg 200 mL/hr over 30 Minutes Intravenous Every 30 min 01/04/20 1504 01/04/20 1614   01/04/20 1500  remdesivir 200 mg in sodium chloride 0.9% 250 mL IVPB  Status:  Discontinued     200 mg 580 mL/hr over 30 Minutes Intravenous Once 01/04/20 1500 01/04/20 1504   01/04/20 1230  cefTRIAXone (ROCEPHIN) 1 g in sodium chloride 0.9 % 100 mL IVPB     1 g 200 mL/hr over 30 Minutes Intravenous  Once 01/04/20 1219 01/04/20 1506   01/04/20 1230  azithromycin (ZITHROMAX) 500 mg in sodium chloride 0.9 % 250 mL IVPB     500 mg 250 mL/hr over 60 Minutes Intravenous  Once 01/04/20 1219 01/04/20 1506        Objective:   Vitals:   01/05/20 1200 01/05/20 1217 01/05/20 1316 01/05/20 1621  BP:  128/77  126/89  Pulse: 69 79    Resp:  19    Temp:  98.4 F (36.9 C)  98.1 F (36.7 C)  TempSrc:  Oral  Oral  SpO2:  90% 90%   Weight:      Height:        Wt Readings from Last 3 Encounters:  01/04/20 120.9 kg  09/20/19 120.7 kg  07/11/18 117.9 kg     Intake/Output Summary (Last 24 hours) at 01/05/2020 1701 Last data filed at 01/05/2020 1258 Gross per 24 hour  Intake 1200 ml  Output 102 ml  Net 1098 ml     Physical Exam  Awake Alert, Oriented X 3, No new F.N deficits, Normal affect, no tremors Symmetrical Chest wall movement, Good air movement bilaterally, CTAB RRR,No Gallops,Rubs or new Murmurs, No Parasternal Heave +ve B.Sounds, Abd Soft, No tenderness, No organomegaly appriciated, No rebound - guarding or rigidity. No Cyanosis, Clubbing or edema, No new Rash or bruise      Data Review:    CBC Recent Labs  Lab 01/04/20 1042 01/04/20 1530 01/05/20 0312  WBC 7.5 10.2 11.9*   HGB 14.2 13.9 13.5  HCT 42.6 41.6 39.5  PLT 176 200 186  MCV 95.9 96.3 94.5  MCH 32.0 32.2 32.3  MCHC 33.3 33.4 34.2  RDW 14.4 14.4 14.5  LYMPHSABS 0.8 0.5* 1.0  MONOABS 0.5 0.4 0.7  EOSABS 0.1 0.0 0.0  BASOSABS 0.0 0.0 0.0    Chemistries  Recent Labs  Lab 01/04/20 1042 01/04/20 1255 01/05/20 0312  NA 137 138 136  K 4.3 4.2 4.0  CL 107 106 104  CO2 17* 17* 21*  GLUCOSE 128* 107* 140*  BUN 9 8 16   CREATININE 0.99 0.92 0.94  CALCIUM 8.9 8.8* 9.1  MG  --  1.6* 1.9  AST 23 24 19   ALT 21 23 21   ALKPHOS 69 67 67  BILITOT 0.6 0.7 0.1*   ------------------------------------------------------------------------------------------------------------------ Recent Labs    01/04/20 1113  TRIG 72    Lab Results  Component Value Date   HGBA1C 5.6 04/13/2016   ------------------------------------------------------------------------------------------------------------------ No results for input(s): TSH, T4TOTAL, T3FREE, THYROIDAB in the last 72 hours.  Invalid input(s): FREET3 ------------------------------------------------------------------------------------------------------------------ Recent Labs    01/04/20 1042 01/05/20 0312  FERRITIN 148 135    Coagulation profile No results for input(s): INR, PROTIME in the last 168 hours.  Recent Labs    01/04/20 1042 01/05/20 0312  DDIMER 2.00* 2.85*    Cardiac Enzymes No results for input(s): CKMB, TROPONINI, MYOGLOBIN in the last 168 hours.  Invalid input(s): CK ------------------------------------------------------------------------------------------------------------------    Component Value Date/Time   BNP 49.0 01/04/2020 1113    Inpatient Medications  Scheduled Meds: . albuterol  2 puff Inhalation Q6H  . dexamethasone  6 mg Oral Q24H  . enoxaparin (LOVENOX) injection  40 mg Subcutaneous Q24H  . fluticasone furoate-vilanterol  1 puff Inhalation Daily  . folic acid  1 mg Oral Daily  . multivitamin with  minerals  1 tablet Oral Daily  . nicotine  21 mg Transdermal Daily  . thiamine  100 mg Oral Daily   Or  . thiamine  100 mg Intravenous Daily   Continuous Infusions: . remdesivir 100 mg in NS 100 mL 100 mg (01/05/20 0904)   PRN Meds:.guaiFENesin, LORazepam **OR** LORazepam  Micro Results Recent Results (from the past 240 hour(s))  Blood Culture (routine x 2)     Status: None (Preliminary result)   Collection Time: 01/04/20 11:00 AM   Specimen: BLOOD RIGHT ARM  Result Value Ref Range Status   Specimen Description BLOOD RIGHT ARM  Final   Special Requests   Final    BOTTLES DRAWN AEROBIC AND ANAEROBIC Blood Culture adequate volume   Culture   Final    NO GROWTH < 24 HOURS Performed at [facility], [address], [place], [place] [address]    Report Status PENDING  Incomplete  Respiratory Panel by RT PCR (Flu A&B, Covid) - Nasopharyngeal Swab     Status: Abnormal   Collection Time: 01/04/20 11:49 AM   Specimen: Nasopharyngeal Swab  Result Value Ref Range Status   SARS Coronavirus 2 by RT PCR POSITIVE (A) NEGATIVE Final    Comment: RESULT CALLED TO, READ BACK BY AND VERIFIED WITH: Merita Norton RN 14:50 01/04/20 ([email or web address]) (NOTE) SARS-CoV-2 target nucleic acids are DETECTED. SARS-CoV-2 RNA is generally detectable in upper respiratory specimens  during the acute phase of infection. Positive results are indicative of the presence of the identified virus, but do not rule out bacterial infection or co-infection with other pathogens not detected by the test. Clinical correlation with patient history and other diagnostic information is necessary to determine patient infection status. The expected result is Negative. Fact Sheet for Patients:  PinkCheek.be Fact Sheet for Healthcare Providers: GravelBags.it This test is not yet approved or cleared by the [place] FDA and  has been authorized for detection and/or diagnosis  of SARS-CoV-2 by FDA under an Emergency Use Authorization (EUA).  This EUA will remain in effect (meaning this test can be used) fo r the duration of  the COVID-19 declaration under Section 564(b)(1) of the Act, 21 U.S.C. section 360bbb-3(b)(1), unless the authorization is terminated or revoked sooner.    Influenza A by PCR NEGATIVE NEGATIVE Final   Influenza B by PCR NEGATIVE NEGATIVE Final    Comment: (NOTE) The Xpert Xpress SARS-CoV-2/FLU/RSV assay is intended as an aid in  the diagnosis of influenza from Nasopharyngeal swab specimens and  should not be used as a sole basis for treatment. Nasal washings and  aspirates are unacceptable for Xpert Xpress SARS-CoV-2/FLU/RSV  testing. Fact Sheet for Patients: PinkCheek.be Fact Sheet for Healthcare Providers: GravelBags.it This test is not yet approved or cleared by the [place] FDA and  has been authorized for detection and/or diagnosis of SARS-CoV-2 by  FDA under an Emergency Use Authorization (EUA). This EUA will remain  in effect (meaning this test can be used) for the duration of the  Covid-19 declaration under Section 564(b)(1) of the Act, 21  U.S.C. section 360bbb-3(b)(1), unless the authorization is  terminated or revoked. Performed at [facility], [place] [address], [place], [place] [address]   Blood Culture (routine x 2)     Status: None (Preliminary result)   Collection Time: 01/04/20 12:49 PM   Specimen: BLOOD RIGHT HAND  Result Value Ref Range Status   Specimen Description BLOOD RIGHT HAND  Final   Special Requests   Final    BOTTLES DRAWN AEROBIC AND ANAEROBIC Blood Culture adequate volume   Culture   Final    NO GROWTH < 24 HOURS Performed at [facility], [place] [address], [place], [place] [address]    Report Status PENDING  Incomplete    Radiology Reports DG Chest Port 1 View  Result Date: 01/04/2020 CLINICAL DATA:  Shortness of breath  and fever. EXAM: PORTABLE CHEST 1 VIEW COMPARISON:  Chest x-ray 05/19/2019 FINDINGS: The heart is borderline enlarged but stable. There is moderate tortuosity of the thoracic aorta. Underlying emphysematous changes and pulmonary scarring with suspected patchy bibasilar infiltrates. No definite pleural effusions. IMPRESSION: Underlying emphysematous changes and pulmonary scarring with suspected patchy bibasilar infiltrates. Electronically Signed   By: Marijo Sanes M.D.   On: 01/04/2020 12:07     Phillips Climes M.D on 01/05/2020  at 5:01 PM  Between 7am to 7pm - Pager - [phone]  After 7pm go to [email or web address] - password [facility]  [facility] -  Office  [phone]

## 2020-01-06 LAB — COMPREHENSIVE METABOLIC PANEL
ALT: 19 U/L (ref 0–44)
AST: 19 U/L (ref 15–41)
Albumin: 3 g/dL — ABNORMAL LOW (ref 3.5–5.0)
Alkaline Phosphatase: 61 U/L (ref 38–126)
Anion gap: 10 (ref 5–15)
BUN: 18 mg/dL (ref 8–23)
CO2: 23 mmol/L (ref 22–32)
Calcium: 8.9 mg/dL (ref 8.9–10.3)
Chloride: 105 mmol/L (ref 98–111)
Creatinine, Ser: 1.02 mg/dL (ref 0.61–1.24)
GFR calc Af Amer: >60 mL/min (ref >60)
GFR calc non Af Amer: >60 mL/min (ref >60)
Glucose, Bld: 203 mg/dL — ABNORMAL HIGH (ref 70–99)
Potassium: 4 mmol/L (ref 3.5–5.1)
Sodium: 138 mmol/L (ref 135–145)
Total Bilirubin: 0.4 mg/dL (ref 0.3–1.2)
Total Protein: 6.3 g/dL — ABNORMAL LOW (ref 6.5–8.1)

## 2020-01-06 LAB — CBC WITH DIFFERENTIAL/PLATELET
Abs Immature Granulocytes: 0.04 10*3/uL (ref 0.00–0.07)
Basophils Absolute: 0 10*3/uL (ref 0.0–0.1)
Basophils Relative: 0 %
Eosinophils Absolute: 0 10*3/uL (ref 0.0–0.5)
Eosinophils Relative: 0 %
HCT: 39.7 % (ref 39.0–52.0)
Hemoglobin: 13.1 g/dL (ref 13.0–17.0)
Immature Granulocytes: 0 %
Lymphocytes Relative: 11 %
Lymphs Abs: 1.1 10*3/uL (ref 0.7–4.0)
MCH: 31.9 pg (ref 26.0–34.0)
MCHC: 33 g/dL (ref 30.0–36.0)
MCV: 96.6 fL (ref 80.0–100.0)
Monocytes Absolute: 0.5 10*3/uL (ref 0.1–1.0)
Monocytes Relative: 5 %
Neutro Abs: 8.3 10*3/uL — ABNORMAL HIGH (ref 1.7–7.7)
Neutrophils Relative %: 84 %
Platelets: 191 10*3/uL (ref 150–400)
RBC: 4.11 MIL/uL — ABNORMAL LOW (ref 4.22–5.81)
RDW: 14.5 % (ref 11.5–15.5)
WBC: 10 10*3/uL (ref 4.0–10.5)
nRBC: 0 % (ref 0.0–0.2)

## 2020-01-06 LAB — C-REACTIVE PROTEIN: CRP: 5.1 mg/dL — ABNORMAL HIGH (ref <1.0)

## 2020-01-06 LAB — FERRITIN: Ferritin: 131 ng/mL (ref 24–336)

## 2020-01-06 LAB — D-DIMER, QUANTITATIVE: D-Dimer, Quant: 1.89 ug/mL-FEU — ABNORMAL HIGH (ref 0.00–0.50)

## 2020-01-06 LAB — PHOSPHORUS: Phosphorus: 3.1 mg/dL (ref 2.5–4.6)

## 2020-01-06 LAB — MAGNESIUM: Magnesium: 1.8 mg/dL (ref 1.7–2.4)

## 2020-01-06 LAB — MYCOPLASMA PNEUMONIAE ANTIBODY, IGM: Mycoplasma pneumo IgM: <770 U/mL (ref 0–769)

## 2020-01-06 NOTE — Progress Notes (Signed)
PROGRESS NOTE                                                                                                                                                                                                             Patient Demographics:    Brent Byrd, is a 64 y.o. male, DOB - 09/27/1956, [id number]  Admit date - 01/04/2020   Admitting Physician Lequita Halt, MD  Outpatient Primary MD for the patient is Nolene Ebbs, MD  LOS - 2   Chief Complaint  Patient presents with  . Fever  . Shortness of Breath  . Emesis  . Nausea       Brief Narrative    Brent Byrd is a 64 y.o. male with medical history significant of alcohol abuse, COPD, hypertension, previous stroke, DVT, presented with worsening of cough and SOB for one month and fevers and chills for one day. Cough has been productive with greenish sputums, gradually getting worse. But not fever or chills until yesterday. Claims he does binge drinking weekly about 6 pack beer every time and last drink was last week. He woke up this morning feeling nauseous and vomited once with stomach content and started to feel more SOB. Then he called EMS, who found pt was 88% on RA, then put on 2 L nasal cannula. Patient is not on oxygen at baseline. Patient was also noted to be tachycardic and have some T wave inversions on the lateral leads. Denies any chest pain. Denies any known exposure to Covid. No chest pain, no abd pain, no diarrhea, no muscle aching, no loss of taste. ED Course: Rapid COVID negative, Xray showing b/l lower field infiltrates. ABX started and confirmation COVID sent. Fever 101.    Subjective:    Brent Byrd today has, No headache, No chest pain, No abdominal pain, denies any fever or chills overnight, reports dyspnea has improved, still reports cough.    Assessment  & Plan :    Active Problems:   Community acquired pneumonia   PNA (pneumonia)   COVID-19   Acute  hypoxic respiratory failure due to COVID-19 pneumonia -This x-ray significant for multifocal opacity in bilateral bases, he presents with fever. -Continue with IV steroids. -Continue with IV remdesivir. -Continue to trend inflammatory markers. -Remains on 2 L nasal cannula, he was encouraged with incentive spirometry, flutter  valve, out of bed to chair and to prone.  COVID-19 Labs  Recent Labs    01/04/20 1042 01/05/20 0312 01/06/20 0340  DDIMER 2.00* 2.85* 1.89*  FERRITIN 148 135 131  LDH 191  --   --   CRP 1.7* 11.2* 5.1*    Lab Results  Component Value Date   SARSCOV2NAA POSITIVE (A) 01/04/2020     Acute COPD exacerbation -Continue with steroids.  He has no wheezing today He had a PFT in 2015 at [facility], DLCO <40% and EF 25-75 65, probably mild to moderate COPD, no home O2.  Hx of DVT -He is not on anticoagulation anymore, D-dimers most likely elevating in the setting of COVID-19 pneumonia  EtOH abuse No s/s of withdrawal, have discussed with the patient, he reports he drinks 2 nights a week only, no more than that, and each night he uses only 2-3 beers. CIWA protocol for now.  Nocotine dependence Educate pt to quit Nicotine patch   COVID-19 Labs  Recent Labs    01/04/20 1042 01/05/20 0312 01/06/20 0340  DDIMER 2.00* 2.85* 1.89*  FERRITIN 148 135 131  LDH 191  --   --   CRP 1.7* 11.2* 5.1*    Lab Results  Component Value Date   SARSCOV2NAA POSITIVE (A) 01/04/2020     Code Status : Full  Family communication: Left his daughter a voicemail  Disposition Plan  : Home when stable, still requiring oxygen, and IV medications  Consults  :  None  Procedures  : None  DVT Prophylaxis  :  Cabery lovenox  Lab Results  Component Value Date   PLT 191 01/06/2020    Antibiotics  :   Anti-infectives (From admission, onward)   Start     Dose/Rate Route Frequency Ordered Stop   01/05/20 1400  azithromycin (ZITHROMAX) tablet 500 mg  Status:   Discontinued     500 mg Oral Daily 01/04/20 1319 01/04/20 1500   01/05/20 1300  cefTRIAXone (ROCEPHIN) 1 g in sodium chloride 0.9 % 100 mL IVPB  Status:  Discontinued     1 g 200 mL/hr over 30 Minutes Intravenous Every 24 hours 01/04/20 1319 01/04/20 1458   01/05/20 1000  remdesivir 100 mg in sodium chloride 0.9 % 100 mL IVPB  Status:  Discontinued     100 mg 200 mL/hr over 30 Minutes Intravenous Daily 01/04/20 1500 01/04/20 1504   01/05/20 1000  remdesivir 100 mg in sodium chloride 0.9 % 100 mL IVPB     100 mg 200 mL/hr over 30 Minutes Intravenous Daily 01/04/20 1504 01/09/20 0959   01/04/20 2300  remdesivir 100 mg in sodium chloride 0.9 % 100 mL IVPB     100 mg 200 mL/hr over 30 Minutes Intravenous Once 01/04/20 2235 01/05/20 0011   01/04/20 1515  remdesivir 100 mg in sodium chloride 0.9 % 100 mL IVPB     100 mg 200 mL/hr over 30 Minutes Intravenous Every 30 min 01/04/20 1504 01/04/20 1614   01/04/20 1500  remdesivir 200 mg in sodium chloride 0.9% 250 mL IVPB  Status:  Discontinued     200 mg 580 mL/hr over 30 Minutes Intravenous Once 01/04/20 1500 01/04/20 1504   01/04/20 1230  cefTRIAXone (ROCEPHIN) 1 g in sodium chloride 0.9 % 100 mL IVPB     1 g 200 mL/hr over 30 Minutes Intravenous  Once 01/04/20 1219 01/04/20 1506   01/04/20 1230  azithromycin (ZITHROMAX) 500 mg in sodium chloride 0.9 % 250  mL IVPB     500 mg 250 mL/hr over 60 Minutes Intravenous  Once 01/04/20 1219 01/04/20 1506        Objective:   Vitals:   01/05/20 2154 01/06/20 0000 01/06/20 0539 01/06/20 1220  BP: (!) 144/84 124/78 130/90 (!) 134/94  Pulse: 70 70 80 63  Resp: (!) 21 20 19 18   Temp: QA348G F (36.8 C)  98.1 F (36.7 C) 97.7 F (36.5 C)  TempSrc:    Oral  SpO2: 93% 90% 90% 91%  Weight:      Height:        Wt Readings from Last 3 Encounters:  01/04/20 120.9 kg  09/20/19 120.7 kg  07/11/18 117.9 kg     Intake/Output Summary (Last 24 hours) at 01/06/2020 1355 Last data filed at 01/06/2020  1300 Gross per 24 hour  Intake 960 ml  Output 1150 ml  Net -190 ml     Physical Exam  Awake Alert, Oriented X 3, No new F.N deficits, Normal affect Symmetrical Chest wall movement, Good air movement bilaterally, CTAB RRR,No Gallops,Rubs or new Murmurs, No Parasternal Heave +ve B.Sounds, Abd Soft, No tenderness, No rebound - guarding or rigidity. No Cyanosis, Clubbing or edema, No new Rash or bruise       Data Review:    CBC Recent Labs  Lab 01/04/20 1042 01/04/20 1530 01/05/20 0312 01/06/20 0340  WBC 7.5 10.2 11.9* 10.0  HGB 14.2 13.9 13.5 13.1  HCT 42.6 41.6 39.5 39.7  PLT 176 200 186 191  MCV 95.9 96.3 94.5 96.6  MCH 32.0 32.2 32.3 31.9  MCHC 33.3 33.4 34.2 33.0  RDW 14.4 14.4 14.5 14.5  LYMPHSABS 0.8 0.5* 1.0 1.1  MONOABS 0.5 0.4 0.7 0.5  EOSABS 0.1 0.0 0.0 0.0  BASOSABS 0.0 0.0 0.0 0.0    Chemistries  Recent Labs  Lab 01/04/20 1042 01/04/20 1255 01/05/20 0312 01/06/20 0340  NA 137 138 136 138  K 4.3 4.2 4.0 4.0  CL 107 106 104 105  CO2 17* 17* 21* 23  GLUCOSE 128* 107* 140* 203*  BUN 9 8 16 18   CREATININE 0.99 0.92 0.94 1.02  CALCIUM 8.9 8.8* 9.1 8.9  MG  --  1.6* 1.9 1.8  AST 23 24 19 19   ALT 21 23 21 19   ALKPHOS 69 67 67 61  BILITOT 0.6 0.7 0.1* 0.4   ------------------------------------------------------------------------------------------------------------------ Recent Labs    01/04/20 1113  TRIG 72    Lab Results  Component Value Date   HGBA1C 5.6 04/13/2016   ------------------------------------------------------------------------------------------------------------------ No results for input(s): TSH, T4TOTAL, T3FREE, THYROIDAB in the last 72 hours.  Invalid input(s): FREET3 ------------------------------------------------------------------------------------------------------------------ Recent Labs    01/05/20 0312 01/06/20 0340  FERRITIN 135 131    Coagulation profile No results for input(s): INR, PROTIME in the last  168 hours.  Recent Labs    01/05/20 0312 01/06/20 0340  DDIMER 2.85* 1.89*    Cardiac Enzymes No results for input(s): CKMB, TROPONINI, MYOGLOBIN in the last 168 hours.  Invalid input(s): CK ------------------------------------------------------------------------------------------------------------------    Component Value Date/Time   BNP 49.0 01/04/2020 1113    Inpatient Medications  Scheduled Meds: . albuterol  2 puff Inhalation Q6H  . dexamethasone  6 mg Oral Q24H  . enoxaparin (LOVENOX) injection  40 mg Subcutaneous Q24H  . fluticasone furoate-vilanterol  1 puff Inhalation Daily  . folic acid  1 mg Oral Daily  . multivitamin with minerals  1 tablet Oral Daily  . nicotine  21 mg  Transdermal Daily  . thiamine  100 mg Oral Daily   Or  . thiamine  100 mg Intravenous Daily   Continuous Infusions: . remdesivir 100 mg in NS 100 mL 100 mg (01/06/20 0906)   PRN Meds:.guaiFENesin, LORazepam **OR** LORazepam  Micro Results Recent Results (from the past 240 hour(s))  Blood Culture (routine x 2)     Status: None (Preliminary result)   Collection Time: 01/04/20 11:00 AM   Specimen: BLOOD RIGHT ARM  Result Value Ref Range Status   Specimen Description BLOOD RIGHT ARM  Final   Special Requests   Final    BOTTLES DRAWN AEROBIC AND ANAEROBIC Blood Culture adequate volume   Culture   Final    NO GROWTH 2 DAYS Performed at [facility], [address] [place], [place] [address]    Report Status PENDING  Incomplete  Respiratory Panel by RT PCR (Flu A&B, Covid) - Nasopharyngeal Swab     Status: Abnormal   Collection Time: 01/04/20 11:49 AM   Specimen: Nasopharyngeal Swab  Result Value Ref Range Status   SARS Coronavirus 2 by RT PCR POSITIVE (A) NEGATIVE Final    Comment: RESULT CALLED TO, READ BACK BY AND VERIFIED WITH: Merita Norton RN 14:50 01/04/20 ([email or web address]) (NOTE) SARS-CoV-2 target nucleic acids are DETECTED. SARS-CoV-2 RNA is generally detectable in upper respiratory  specimens  during the acute phase of infection. Positive results are indicative of the presence of the identified virus, but do not rule out bacterial infection or co-infection with other pathogens not detected by the test. Clinical correlation with patient history and other diagnostic information is necessary to determine patient infection status. The expected result is Negative. Fact Sheet for Patients:  PinkCheek.be Fact Sheet for Healthcare Providers: GravelBags.it This test is not yet approved or cleared by the [place] FDA and  has been authorized for detection and/or diagnosis of SARS-CoV-2 by FDA under an Emergency Use Authorization (EUA).  This EUA will remain in effect (meaning this test can be used) fo r the duration of  the COVID-19 declaration under Section 564(b)(1) of the Act, 21 U.S.C. section 360bbb-3(b)(1), unless the authorization is terminated or revoked sooner.    Influenza A by PCR NEGATIVE NEGATIVE Final   Influenza B by PCR NEGATIVE NEGATIVE Final    Comment: (NOTE) The Xpert Xpress SARS-CoV-2/FLU/RSV assay is intended as an aid in  the diagnosis of influenza from Nasopharyngeal swab specimens and  should not be used as a sole basis for treatment. Nasal washings and  aspirates are unacceptable for Xpert Xpress SARS-CoV-2/FLU/RSV  testing. Fact Sheet for Patients: PinkCheek.be Fact Sheet for Healthcare Providers: GravelBags.it This test is not yet approved or cleared by the [place] FDA and  has been authorized for detection and/or diagnosis of SARS-CoV-2 by  FDA under an Emergency Use Authorization (EUA). This EUA will remain  in effect (meaning this test can be used) for the duration of the  Covid-19 declaration under Section 564(b)(1) of the Act, 21  U.S.C. section 360bbb-3(b)(1), unless the authorization is  terminated or  revoked. Performed at [facility], [place] [address], [place], [place] [address]   Blood Culture (routine x 2)     Status: None (Preliminary result)   Collection Time: 01/04/20 12:49 PM   Specimen: BLOOD RIGHT HAND  Result Value Ref Range Status   Specimen Description BLOOD RIGHT HAND  Final   Special Requests   Final    BOTTLES DRAWN AEROBIC AND ANAEROBIC Blood Culture  adequate volume   Culture   Final    NO GROWTH 2 DAYS Performed at [facility], [place] [address], [place], [place] [address]    Report Status PENDING  Incomplete    Radiology Reports DG Chest Port 1 View  Result Date: 01/04/2020 CLINICAL DATA:  Shortness of breath and fever. EXAM: PORTABLE CHEST 1 VIEW COMPARISON:  Chest x-ray 05/19/2019 FINDINGS: The heart is borderline enlarged but stable. There is moderate tortuosity of the thoracic aorta. Underlying emphysematous changes and pulmonary scarring with suspected patchy bibasilar infiltrates. No definite pleural effusions. IMPRESSION: Underlying emphysematous changes and pulmonary scarring with suspected patchy bibasilar infiltrates. Electronically Signed   By: Marijo Sanes M.D.   On: 01/04/2020 12:07     Phillips Climes M.D on 01/06/2020 at 1:55 PM  Between 7am to 7pm - Pager - [phone]  After 7pm go to [email or web address] - password [facility]  [facility] -  Office  [phone]

## 2020-01-07 LAB — PHOSPHORUS: Phosphorus: 3.8 mg/dL (ref 2.5–4.6)

## 2020-01-07 LAB — CBC WITH DIFFERENTIAL/PLATELET
Abs Immature Granulocytes: 0.07 10*3/uL (ref 0.00–0.07)
Basophils Absolute: 0 10*3/uL (ref 0.0–0.1)
Basophils Relative: 0 %
Eosinophils Absolute: 0 10*3/uL (ref 0.0–0.5)
Eosinophils Relative: 0 %
HCT: 40.1 % (ref 39.0–52.0)
Hemoglobin: 13.2 g/dL (ref 13.0–17.0)
Immature Granulocytes: 1 %
Lymphocytes Relative: 13 %
Lymphs Abs: 1.2 10*3/uL (ref 0.7–4.0)
MCH: 31.9 pg (ref 26.0–34.0)
MCHC: 32.9 g/dL (ref 30.0–36.0)
MCV: 96.9 fL (ref 80.0–100.0)
Monocytes Absolute: 0.4 10*3/uL (ref 0.1–1.0)
Monocytes Relative: 4 %
Neutro Abs: 7.4 10*3/uL (ref 1.7–7.7)
Neutrophils Relative %: 82 %
Platelets: 210 10*3/uL (ref 150–400)
RBC: 4.14 MIL/uL — ABNORMAL LOW (ref 4.22–5.81)
RDW: 14.4 % (ref 11.5–15.5)
WBC: 9.1 10*3/uL (ref 4.0–10.5)
nRBC: 0 % (ref 0.0–0.2)

## 2020-01-07 LAB — GLUCOSE, CAPILLARY
Glucose-Capillary: 151 mg/dL — ABNORMAL HIGH (ref 70–99)
Glucose-Capillary: 121 mg/dL — ABNORMAL HIGH (ref 70–99)
Glucose-Capillary: 215 mg/dL — ABNORMAL HIGH (ref 70–99)
Glucose-Capillary: 163 mg/dL — ABNORMAL HIGH (ref 70–99)

## 2020-01-07 LAB — COMPREHENSIVE METABOLIC PANEL
ALT: 31 U/L (ref 0–44)
AST: 36 U/L (ref 15–41)
Albumin: 3.1 g/dL — ABNORMAL LOW (ref 3.5–5.0)
Alkaline Phosphatase: 52 U/L (ref 38–126)
Anion gap: 11 (ref 5–15)
BUN: 14 mg/dL (ref 8–23)
CO2: 21 mmol/L — ABNORMAL LOW (ref 22–32)
Calcium: 8.6 mg/dL — ABNORMAL LOW (ref 8.9–10.3)
Chloride: 105 mmol/L (ref 98–111)
Creatinine, Ser: 0.97 mg/dL (ref 0.61–1.24)
GFR calc Af Amer: >60 mL/min (ref >60)
GFR calc non Af Amer: >60 mL/min (ref >60)
Glucose, Bld: 175 mg/dL — ABNORMAL HIGH (ref 70–99)
Potassium: 4.3 mmol/L (ref 3.5–5.1)
Sodium: 137 mmol/L (ref 135–145)
Total Bilirubin: 0.4 mg/dL (ref 0.3–1.2)
Total Protein: 6.4 g/dL — ABNORMAL LOW (ref 6.5–8.1)

## 2020-01-07 LAB — HEMOGLOBIN A1C
Hgb A1c MFr Bld: 6 % — ABNORMAL HIGH (ref 4.8–5.6)
Mean Plasma Glucose: 125.5 mg/dL

## 2020-01-07 LAB — FERRITIN: Ferritin: 124 ng/mL (ref 24–336)

## 2020-01-07 LAB — MAGNESIUM: Magnesium: 1.7 mg/dL (ref 1.7–2.4)

## 2020-01-07 LAB — C-REACTIVE PROTEIN: CRP: 1.4 mg/dL — ABNORMAL HIGH (ref <1.0)

## 2020-01-07 LAB — D-DIMER, QUANTITATIVE: D-Dimer, Quant: 2.07 ug/mL-FEU — ABNORMAL HIGH (ref 0.00–0.50)

## 2020-01-07 NOTE — TOC Progression Note (Addendum)
Transition of Care [facility]) - Progression Note    Patient Details  Name: Brent Byrd MRN: [id number] Date of Birth: August 10, 1956  Transition of Care [facility]) CM/SW [place], [place] Phone Number: 01/07/2020, 3:18 PM  Clinical Narrative:    CSW spoke with pt by phone.  Pt resides at a [employer] at [facility].  Pt gave verbal permission for CSW to speak with person who oversees the [facility], Mr. Naomie Dean at [phone].  According to Mr. Murcock there is not anywhere for pt to isolate for Covid at the [facility].  Pt is agreeable to stay at [facility] until his isolation period is over.  Mr. Tawni Millers will hold pt's room during period of isolation.  CSW will fill out the paperwork to begin process for disposition.  CSW gave pt resources for housing.  CSW will continue to follow for disposition.        Expected Discharge Plan and Services                                                 Social Determinants of Health (SDOH) Interventions    Readmission Risk Interventions No flowsheet data found.

## 2020-01-07 NOTE — Care Management (Addendum)
Patient provided substance abuse resources. Despite multiple attempts, could not reach patient over the phone to discuss.

## 2020-01-07 NOTE — Progress Notes (Signed)
PROGRESS NOTE                                                                                                                                                                                                             Patient Demographics:    Brent Byrd, is a 64 y.o. male, DOB - 1956/09/21, [id number]  Admit date - 01/04/2020   Admitting Physician Lequita Halt, MD  Outpatient Primary MD for the patient is Nolene Ebbs, MD  LOS - 3   Chief Complaint  Patient presents with  . Fever  . Shortness of Breath  . Emesis  . Nausea       Brief Narrative    Brent Byrd is a 64 y.o. male with medical history significant of alcohol abuse, COPD, hypertension, previous stroke, DVT, presented with worsening of cough and SOB for one month and fevers and chills for one day. Cough has been productive with greenish sputums, gradually getting worse. But not fever or chills until yesterday. Claims he does binge drinking weekly about 6 pack beer every time and last drink was last week. He woke up this morning feeling nauseous and vomited once with stomach content and started to feel more SOB. Then he called EMS, who found pt was 88% on RA, then put on 2 L nasal cannula. Patient is not on oxygen at baseline. Patient was also noted to be tachycardic and have some T wave inversions on the lateral leads. Denies any chest pain. Denies any known exposure to Covid. No chest pain, no abd pain, no diarrhea, no muscle aching, no loss of taste. ED Course: Rapid COVID negative, Xray showing b/l lower field infiltrates. ABX started and confirmation COVID sent. Fever 101.    Subjective:    Brent Byrd today has, No headache, No chest pain, No abdominal pain, denies any fever or chills overnight, reports dyspnea has improved, still reports cough.    Assessment  & Plan :    Active Problems:   Community acquired pneumonia   PNA (pneumonia)   COVID-19   Acute  hypoxic respiratory failure due to COVID-19 pneumonia -This x-ray significant for multifocal opacity in bilateral bases, he presents with fever. -Continue with IV steroids. -Continue with IV remdesivir. -Continue to trend inflammatory markers. -This morning he is on room air, he was encouraged with incentive spirometry,  flutter valve, out of bed to chair and to prone.  COVID-19 Labs  Recent Labs    01/05/20 0312 01/06/20 0340 01/07/20 0714  DDIMER 2.85* 1.89* 2.07*  FERRITIN 135 131 124  CRP 11.2* 5.1* 1.4*    Lab Results  Component Value Date   SARSCOV2NAA POSITIVE (A) 01/04/2020     Acute COPD exacerbation -Continue with steroids.  He has no wheezing today He had a PFT in 2015 at [facility], DLCO <40% and EF 25-75 65, probably mild to moderate COPD, no home O2.  Hx of DVT -He is not on anticoagulation anymore, D-dimers most likely elevating in the setting of COVID-19 pneumonia  EtOH abuse No s/s of withdrawal, have discussed with the patient, he reports he drinks 2 nights a week only, no more than that, and each night he uses only 2-3 beers. CIWA protocol for now.  Nocotine dependence Educate pt to quit Nicotine patch   COVID-19 Labs  Recent Labs    01/05/20 0312 01/06/20 0340 01/07/20 0714  DDIMER 2.85* 1.89* 2.07*  FERRITIN 135 131 124  CRP 11.2* 5.1* 1.4*    Lab Results  Component Value Date   SARSCOV2NAA POSITIVE (A) 01/04/2020     Code Status : Full  Family communication: Left his daughter a voicemail 3/13  Disposition Plan  : Remains on IV remdesivir and steroids, he is from boarding home, will consult social worker.  Consults  :  None  Procedures  : None  DVT Prophylaxis  :  [place] lovenox  Lab Results  Component Value Date   PLT 210 01/07/2020    Antibiotics  :   Anti-infectives (From admission, onward)   Start     Dose/Rate Route Frequency Ordered Stop   01/05/20 1400  azithromycin (ZITHROMAX) tablet 500 mg  Status:   Discontinued     500 mg Oral Daily 01/04/20 1319 01/04/20 1500   01/05/20 1300  cefTRIAXone (ROCEPHIN) 1 g in sodium chloride 0.9 % 100 mL IVPB  Status:  Discontinued     1 g 200 mL/hr over 30 Minutes Intravenous Every 24 hours 01/04/20 1319 01/04/20 1458   01/05/20 1000  remdesivir 100 mg in sodium chloride 0.9 % 100 mL IVPB  Status:  Discontinued     100 mg 200 mL/hr over 30 Minutes Intravenous Daily 01/04/20 1500 01/04/20 1504   01/05/20 1000  remdesivir 100 mg in sodium chloride 0.9 % 100 mL IVPB     100 mg 200 mL/hr over 30 Minutes Intravenous Daily 01/04/20 1504 01/09/20 0959   01/04/20 2300  remdesivir 100 mg in sodium chloride 0.9 % 100 mL IVPB     100 mg 200 mL/hr over 30 Minutes Intravenous Once 01/04/20 2235 01/05/20 0011   01/04/20 1515  remdesivir 100 mg in sodium chloride 0.9 % 100 mL IVPB     100 mg 200 mL/hr over 30 Minutes Intravenous Every 30 min 01/04/20 1504 01/04/20 1614   01/04/20 1500  remdesivir 200 mg in sodium chloride 0.9% 250 mL IVPB  Status:  Discontinued     200 mg 580 mL/hr over 30 Minutes Intravenous Once 01/04/20 1500 01/04/20 1504   01/04/20 1230  cefTRIAXone (ROCEPHIN) 1 g in sodium chloride 0.9 % 100 mL IVPB     1 g 200 mL/hr over 30 Minutes Intravenous  Once 01/04/20 1219 01/04/20 1506   01/04/20 1230  azithromycin (ZITHROMAX) 500 mg in sodium chloride 0.9 % 250 mL IVPB     500 mg 250 mL/hr  over 60 Minutes Intravenous  Once 01/04/20 1219 01/04/20 1506        Objective:   Vitals:   01/06/20 2107 01/07/20 0000 01/07/20 0638 01/07/20 1243  BP: (!) 144/98  126/90 129/89  Pulse: 78 70 63 62  Resp:    19  Temp: 97.8 F (36.6 C)  97.6 F (36.4 C) 97.6 F (36.4 C)  TempSrc:    Oral  SpO2: 96%  94% 97%  Weight:      Height:        Wt Readings from Last 3 Encounters:  01/04/20 120.9 kg  09/20/19 120.7 kg  07/11/18 117.9 kg     Intake/Output Summary (Last 24 hours) at 01/07/2020 1342 Last data filed at 01/07/2020 0846 Gross per 24 hour   Intake 960 ml  Output --  Net 960 ml     Physical Exam  Awake Alert, Oriented X 3, No new F.N deficits, Normal affect Symmetrical Chest wall movement, Good air movement bilaterally, CTAB RRR,No Gallops,Rubs or new Murmurs, No Parasternal Heave +ve B.Sounds, Abd Soft, No tenderness, No rebound - guarding or rigidity. No Cyanosis, Clubbing or edema, No new Rash or bruise        Data Review:    CBC Recent Labs  Lab 01/04/20 1042 01/04/20 1530 01/05/20 0312 01/06/20 0340 01/07/20 0714  WBC 7.5 10.2 11.9* 10.0 9.1  HGB 14.2 13.9 13.5 13.1 13.2  HCT 42.6 41.6 39.5 39.7 40.1  PLT 176 200 186 191 210  MCV 95.9 96.3 94.5 96.6 96.9  MCH 32.0 32.2 32.3 31.9 31.9  MCHC 33.3 33.4 34.2 33.0 32.9  RDW 14.4 14.4 14.5 14.5 14.4  LYMPHSABS 0.8 0.5* 1.0 1.1 1.2  MONOABS 0.5 0.4 0.7 0.5 0.4  EOSABS 0.1 0.0 0.0 0.0 0.0  BASOSABS 0.0 0.0 0.0 0.0 0.0    Chemistries  Recent Labs  Lab 01/04/20 1042 01/04/20 1255 01/05/20 0312 01/06/20 0340 01/07/20 0714  NA 137 138 136 138 137  K 4.3 4.2 4.0 4.0 4.3  CL 107 106 104 105 105  CO2 17* 17* 21* 23 21*  GLUCOSE 128* 107* 140* 203* 175*  BUN 9 8 16 18 14   CREATININE 0.99 0.92 0.94 1.02 0.97  CALCIUM 8.9 8.8* 9.1 8.9 8.6*  MG  --  1.6* 1.9 1.8 1.7  AST 23 24 19 19  36  ALT 21 23 21 19 31   ALKPHOS 69 67 67 61 52  BILITOT 0.6 0.7 0.1* 0.4 0.4   ------------------------------------------------------------------------------------------------------------------ No results for input(s): CHOL, HDL, LDLCALC, TRIG, CHOLHDL, LDLDIRECT in the last 72 hours.  Lab Results  Component Value Date   HGBA1C 6.0 (H) 01/07/2020   ------------------------------------------------------------------------------------------------------------------ No results for input(s): TSH, T4TOTAL, T3FREE, THYROIDAB in the last 72 hours.  Invalid input(s):  FREET3 ------------------------------------------------------------------------------------------------------------------ Recent Labs    01/06/20 0340 01/07/20 0714  FERRITIN 131 124    Coagulation profile No results for input(s): INR, PROTIME in the last 168 hours.  Recent Labs    01/06/20 0340 01/07/20 0714  DDIMER 1.89* 2.07*    Cardiac Enzymes No results for input(s): CKMB, TROPONINI, MYOGLOBIN in the last 168 hours.  Invalid input(s): CK ------------------------------------------------------------------------------------------------------------------    Component Value Date/Time   BNP 49.0 01/04/2020 1113    Inpatient Medications  Scheduled Meds: . albuterol  2 puff Inhalation Q6H  . dexamethasone  6 mg Oral Q24H  . enoxaparin (LOVENOX) injection  40 mg Subcutaneous Q24H  . fluticasone furoate-vilanterol  1 puff Inhalation Daily  . folic acid  1 mg Oral Daily  . multivitamin with minerals  1 tablet Oral Daily  . nicotine  21 mg Transdermal Daily  . thiamine  100 mg Oral Daily   Or  . thiamine  100 mg Intravenous Daily   Continuous Infusions: . remdesivir 100 mg in NS 100 mL 100 mg (01/07/20 0918)   PRN Meds:.guaiFENesin, LORazepam **OR** LORazepam  Micro Results Recent Results (from the past 240 hour(s))  Blood Culture (routine x 2)     Status: None (Preliminary result)   Collection Time: 01/04/20 11:00 AM   Specimen: BLOOD RIGHT ARM  Result Value Ref Range Status   Specimen Description BLOOD RIGHT ARM  Final   Special Requests   Final    BOTTLES DRAWN AEROBIC AND ANAEROBIC Blood Culture adequate volume   Culture   Final    NO GROWTH 2 DAYS Performed at [facility], [address], [place], [place] [address]    Report Status PENDING  Incomplete  Respiratory Panel by RT PCR (Flu A&B, Covid) - Nasopharyngeal Swab     Status: Abnormal   Collection Time: 01/04/20 11:49 AM   Specimen: Nasopharyngeal Swab  Result Value Ref Range Status   SARS  Coronavirus 2 by RT PCR POSITIVE (A) NEGATIVE Final    Comment: RESULT CALLED TO, READ BACK BY AND VERIFIED WITH: Merita Norton RN 14:50 01/04/20 ([email or web address]) (NOTE) SARS-CoV-2 target nucleic acids are DETECTED. SARS-CoV-2 RNA is generally detectable in upper respiratory specimens  during the acute phase of infection. Positive results are indicative of the presence of the identified virus, but do not rule out bacterial infection or co-infection with other pathogens not detected by the test. Clinical correlation with patient history and other diagnostic information is necessary to determine patient infection status. The expected result is Negative. Fact Sheet for Patients:  PinkCheek.be Fact Sheet for Healthcare Providers: GravelBags.it This test is not yet approved or cleared by the [place] FDA and  has been authorized for detection and/or diagnosis of SARS-CoV-2 by FDA under an Emergency Use Authorization (EUA).  This EUA will remain in effect (meaning this test can be used) fo r the duration of  the COVID-19 declaration under Section 564(b)(1) of the Act, 21 U.S.C. section 360bbb-3(b)(1), unless the authorization is terminated or revoked sooner.    Influenza A by PCR NEGATIVE NEGATIVE Final   Influenza B by PCR NEGATIVE NEGATIVE Final    Comment: (NOTE) The Xpert Xpress SARS-CoV-2/FLU/RSV assay is intended as an aid in  the diagnosis of influenza from Nasopharyngeal swab specimens and  should not be used as a sole basis for treatment. Nasal washings and  aspirates are unacceptable for Xpert Xpress SARS-CoV-2/FLU/RSV  testing. Fact Sheet for Patients: PinkCheek.be Fact Sheet for Healthcare Providers: GravelBags.it This test is not yet approved or cleared by the [place] FDA and  has been authorized for detection and/or diagnosis of SARS-CoV-2 by  FDA under an  Emergency Use Authorization (EUA). This EUA will remain  in effect (meaning this test can be used) for the duration of the  Covid-19 declaration under Section 564(b)(1) of the Act, 21  U.S.C. section 360bbb-3(b)(1), unless the authorization is  terminated or revoked. Performed at [facility], [place] [address], [place], [place] [address]   Blood Culture (routine x 2)     Status: None (Preliminary result)   Collection Time: 01/04/20 12:49 PM   Specimen: BLOOD RIGHT HAND  Result Value Ref Range Status   Specimen Description BLOOD RIGHT  HAND  Final   Special Requests   Final    BOTTLES DRAWN AEROBIC AND ANAEROBIC Blood Culture adequate volume   Culture   Final    NO GROWTH 2 DAYS Performed at [facility], [address], [place], [place] [address]    Report Status PENDING  Incomplete    Radiology Reports DG Chest Port 1 View  Result Date: 01/04/2020 CLINICAL DATA:  Shortness of breath and fever. EXAM: PORTABLE CHEST 1 VIEW COMPARISON:  Chest x-ray 05/19/2019 FINDINGS: The heart is borderline enlarged but stable. There is moderate tortuosity of the thoracic aorta. Underlying emphysematous changes and pulmonary scarring with suspected patchy bibasilar infiltrates. No definite pleural effusions. IMPRESSION: Underlying emphysematous changes and pulmonary scarring with suspected patchy bibasilar infiltrates. Electronically Signed   By: Marijo Sanes M.D.   On: 01/04/2020 12:07     Phillips Climes M.D on 01/07/2020 at 1:42 PM  Between 7am to 7pm - Pager - [phone]  After 7pm go to [email or web address] - password [facility]  [facility] -  Office  [phone]

## 2020-01-07 NOTE — Progress Notes (Signed)
SATURATION QUALIFICATIONS: (This note is used to comply with regulatory documentation for home oxygen)  Patient Saturations on Room Air at Rest = 93%  Patient Saturations on Room Air while Ambulating = 91%-89%  Patient Saturations on 0 Liters of oxygen while Ambulating =91%-89%  Pt does not require oxygen at home.

## 2020-01-08 LAB — CBC WITH DIFFERENTIAL/PLATELET
Abs Immature Granulocytes: 0.14 10*3/uL — ABNORMAL HIGH (ref 0.00–0.07)
Basophils Absolute: 0 10*3/uL (ref 0.0–0.1)
Basophils Relative: 0 %
Eosinophils Absolute: 0.1 10*3/uL (ref 0.0–0.5)
Eosinophils Relative: 1 %
HCT: 38.1 % — ABNORMAL LOW (ref 39.0–52.0)
Hemoglobin: 12.6 g/dL — ABNORMAL LOW (ref 13.0–17.0)
Immature Granulocytes: 2 %
Lymphocytes Relative: 23 %
Lymphs Abs: 2.1 10*3/uL (ref 0.7–4.0)
MCH: 31.5 pg (ref 26.0–34.0)
MCHC: 33.1 g/dL (ref 30.0–36.0)
MCV: 95.3 fL (ref 80.0–100.0)
Monocytes Absolute: 0.7 10*3/uL (ref 0.1–1.0)
Monocytes Relative: 8 %
Neutro Abs: 6.1 10*3/uL (ref 1.7–7.7)
Neutrophils Relative %: 66 %
Platelets: 199 10*3/uL (ref 150–400)
RBC: 4 MIL/uL — ABNORMAL LOW (ref 4.22–5.81)
RDW: 14.3 % (ref 11.5–15.5)
WBC: 9.2 10*3/uL (ref 4.0–10.5)
nRBC: 0 % (ref 0.0–0.2)

## 2020-01-08 LAB — FERRITIN: Ferritin: 108 ng/mL (ref 24–336)

## 2020-01-08 LAB — COMPREHENSIVE METABOLIC PANEL
ALT: 32 U/L (ref 0–44)
AST: 19 U/L (ref 15–41)
Albumin: 2.9 g/dL — ABNORMAL LOW (ref 3.5–5.0)
Alkaline Phosphatase: 55 U/L (ref 38–126)
Anion gap: 8 (ref 5–15)
BUN: 14 mg/dL (ref 8–23)
CO2: 24 mmol/L (ref 22–32)
Calcium: 8.5 mg/dL — ABNORMAL LOW (ref 8.9–10.3)
Chloride: 106 mmol/L (ref 98–111)
Creatinine, Ser: 1.09 mg/dL (ref 0.61–1.24)
GFR calc Af Amer: >60 mL/min (ref >60)
GFR calc non Af Amer: >60 mL/min (ref >60)
Glucose, Bld: 195 mg/dL — ABNORMAL HIGH (ref 70–99)
Potassium: 4 mmol/L (ref 3.5–5.1)
Sodium: 138 mmol/L (ref 135–145)
Total Bilirubin: 0.4 mg/dL (ref 0.3–1.2)
Total Protein: 6.1 g/dL — ABNORMAL LOW (ref 6.5–8.1)

## 2020-01-08 LAB — MAGNESIUM: Magnesium: 1.7 mg/dL (ref 1.7–2.4)

## 2020-01-08 LAB — GLUCOSE, CAPILLARY
Glucose-Capillary: 169 mg/dL — ABNORMAL HIGH (ref 70–99)
Glucose-Capillary: 177 mg/dL — ABNORMAL HIGH (ref 70–99)

## 2020-01-08 LAB — PHOSPHORUS: Phosphorus: 5.1 mg/dL — ABNORMAL HIGH (ref 2.5–4.6)

## 2020-01-08 LAB — C-REACTIVE PROTEIN: CRP: 1.1 mg/dL — ABNORMAL HIGH (ref <1.0)

## 2020-01-08 LAB — D-DIMER, QUANTITATIVE: D-Dimer, Quant: 1.81 ug/mL-FEU — ABNORMAL HIGH (ref 0.00–0.50)

## 2020-01-08 MED ORDER — FAMOTIDINE 20 MG PO TABS: 20.0000 mg | ORAL_TABLET | Freq: Two times a day (BID) | ORAL | 0 refills | Status: DC

## 2020-01-08 MED ORDER — VITAMIN B-1 100 MG PO TABS: 100.0000 mg | ORAL_TABLET | Freq: Every day | ORAL | 0 refills | Status: DC

## 2020-01-08 MED ORDER — NICOTINE 21 MG/24HR TD PT24: 21.0000 mg | MEDICATED_PATCH | Freq: Every day | TRANSDERMAL | 0 refills | Status: DC

## 2020-01-08 MED ORDER — MAGNESIUM SULFATE IN D5W 1-5 GM/100ML-% IV SOLN
1.0000 g | Freq: Once | INTRAVENOUS | Status: AC
  Administered 2020-01-08: 1 g via INTRAVENOUS
  Filled 2020-01-08: qty 100

## 2020-01-08 MED ORDER — FOLIC ACID 1 MG PO TABS: 1.0000 mg | ORAL_TABLET | Freq: Every day | ORAL | 0 refills | Status: DC

## 2020-01-08 MED ORDER — NITROGLYCERIN 0.4 MG SL SUBL: 0.4000 mg | SUBLINGUAL_TABLET | SUBLINGUAL | 0 refills | Status: DC | PRN

## 2020-01-08 MED ORDER — DEXAMETHASONE 6 MG PO TABS: 6.0000 mg | ORAL_TABLET | Freq: Every day | ORAL | 0 refills | Status: DC

## 2020-01-08 MED FILL — NITROGLYCERIN 0.4 MG TAB SL: 0.4 | 8 days supply | Qty: 25 | Fill #0

## 2020-01-08 MED FILL — VITAMIN B-1 100 MG TABS: 100 | 30 days supply | Qty: 30 | Fill #0

## 2020-01-08 MED FILL — FOLIC ACID 1 MG TABS: 1 | 30 days supply | Qty: 30 | Fill #0

## 2020-01-08 MED FILL — NICOTINE 21 MG/24HR PATCH: 21 | 28 days supply | Qty: 28 | Fill #0

## 2020-01-08 MED FILL — FAMOTIDINE 20 MG TABS: 20 | 10 days supply | Qty: 20 | Fill #0

## 2020-01-08 MED FILL — DEXAMETHASONE 6 MG TABLET: 6 | 5 days supply | Qty: 5 | Fill #0

## 2020-01-08 NOTE — Discharge Summary (Signed)
Brent Byrd, is a 64 y.o. male  DOB [birth year]  MRN [id number].  Admission date:  01/04/2020  Admitting Physician  Lequita Halt, MD  Discharge Date:  01/08/2020   Primary MD  Nolene Ebbs, MD  Recommendations for primary care physician for things to follow:  -Please check CBC, CMP during next visit. -Continue counseling about tobacco cessation -Please monitor A1c as an outpatient.  Admission Diagnosis  Acute respiratory failure with hypoxia (HCC) [J96.01] PNA (pneumonia) [J18.9] COVID-19 [U07.1]   Discharge Diagnosis  Acute respiratory failure with hypoxia (HCC) [J96.01] PNA (pneumonia) [J18.9] COVID-19 [U07.1]    Active Problems:   Community acquired pneumonia   PNA (pneumonia)   COVID-19      Past Medical History:  Diagnosis Date  . Alcohol abuse   . Arthritis    Left knee, lower back  . Chest pain    hx  . COPD (chronic obstructive pulmonary disease) ([place])    "[place] said I don't have this" (10/26/2016)  . Depression    no meds  . DVT (deep venous thrombosis) ([place]) 2008   Right leg- txed with coumadin   . DVT (deep venous thrombosis) ([place]) 2010; 2017   RLE; RLE  . Gastroesophageal reflux disease 2014  . Hypertension     per patiente-controlled, was taken off BP med  . MI, old ~ 2007  . Stroke ([place]) 01/2012   denies residual on 10/26/2016  . Substance abuse ([place])    Alcohol dependence Hx  . Tobacco abuse     Past Surgical History:  Procedure Laterality Date  . BACK SURGERY    . GANGLION CYST EXCISION Left   . HAMMER TOE SURGERY Left 07/2012   2nd toe/08/05/2012  . LUMBAR DISC SURGERY  06/2007   Herniated disc/notes 02/25/2011  . WISDOM TOOTH EXTRACTION         History of present illness and  Hospital Course:     Kindly see H&P for history of present illness and admission details, please review complete Labs, Consult reports and Test reports for all details in  brief  HPI  from the history and physical done on the day of admission 01/04/2020  HPI: Brent Byrd is a 64 y.o. male with medical history significant of alcohol abuse, COPD, hypertension, previous stroke, DVT, presented with worsening of cough and SOB for one month and fevers and chills for one day. Cough has been productive with greenish sputums, gradually getting worse. But not fever or chills until yesterday. Claims he does binge drinking weekly about 6 pack beer every time and last drink was last week. He woke up this morning feeling nauseous and vomited once with stomach content and started to feel more SOB. Then he called EMS, who found pt was 88% on RA, then put on 2 L nasal cannula. Patient is not on oxygen at baseline. Patient was also noted to be tachycardic and have some T wave inversions on the lateral leads. Denies any chest pain. Denies any known exposure to Covid. No  chest pain, no abd pain, no diarrhea, no muscle aching, no loss of taste. ED Course: Rapid COVID negative, Xray showing b/l lower field infiltrates. ABX started and confirmation COVID sent. Fever 101.  Hospital Course   Acute hypoxic respiratory failure due to COVID-19 pneumonia -This x-ray significant for multifocal opacity in bilateral bases, he presents with fever, and he did require oxygen supplementation initially, he was treated with IV steroids, IV remdesivir, hypoxia has resolved, oxygen requirement over last 48 hours, he will be discharged another 5 days of oral steroids to finish total of 10 days treatment, he was encouraged to take incentive spirometry and flutter valve with him and keep using it after discharge.  Acute COPD exacerbation -Patient currently with no wheezing, he was treated with steroids - He had a PFT in 2015 at [facility], DLCO <40% and EF 25-75 65, probably mild to moderate COPD, no home O2.  Hx of DVT -He is not on anticoagulation anymore, D-dimers most likely elevating in the  setting of COVID-19 pneumonia  EtOH abuse No s/s of withdrawal, have discussed with the patient, he reports he drinks 2 nights a week only, no more than that, and each night he uses only 2-3 beers. Was kept on CIWA protocol, there was no evidence of withdrawals  Nocotine dependence Educate pt to quit Nicotine patch  Hyperglycemia/prediabetes -A1c is 6, prediabetes, counseled patient about diet compliance, CBG elevated during hospital stay secondary to Covid and steroids, no medications initiated on discharge, as we will anticipate CBGs will improve after Covid resolves, and he is off steroids.  Continue to follow as an outpatient  Discharge Condition:  stable   Follow UP  Follow-up Information    Nolene Ebbs, MD Follow up in 17 day(s).   Specialty: Internal Medicine Contact information: [address] [place] [address] [phone]             Discharge Instructions  and  Discharge Medications    Discharge Instructions    Discharge instructions   Complete by: As directed    Follow with Primary MD Nolene Ebbs, MD in 17 days   Get CBC, CMP,  checked  by Primary MD next visit.    Activity: As tolerated with Full fall precautions use walker/cane & assistance as needed   Disposition Home    Diet: Carb Modified .   On your next visit with your primary care physician please Get Medicines reviewed and adjusted.   Please request your Prim.MD to go over all Hospital Tests and Procedure/Radiological results at the follow up, please get all Hospital records sent to your Prim MD by signing hospital release before you go home.   If you experience worsening of your admission symptoms, develop shortness of breath, life threatening emergency, suicidal or homicidal thoughts you must seek medical attention immediately by calling 911 or calling your MD immediately  if symptoms less severe.  You Must read complete instructions/literature along with all the  possible adverse reactions/side effects for all the Medicines you take and that have been prescribed to you. Take any new Medicines after you have completely understood and accpet all the possible adverse reactions/side effects.   Do not drive, operating heavy machinery, perform activities at heights, swimming or participation in water activities or provide baby sitting services if your were admitted for syncope or siezures until you have seen by Primary MD or a Neurologist and advised to do so again.  Do not drive when taking Pain medications.  Do not take more than prescribed Pain, Sleep and Anxiety Medications  Special Instructions: If you have smoked or chewed Tobacco  in the last 2 yrs please stop smoking, stop any regular Alcohol  and or any Recreational drug use.  Wear Seat belts while driving.   Please note  You were cared for by a hospitalist during your hospital stay. If you have any questions about your discharge medications or the care you received while you were in the hospital after you are discharged, you can call the unit and asked to speak with the hospitalist on call if the hospitalist that took care of you is not available. Once you are discharged, your primary care physician will handle any further medical issues. Please note that NO REFILLS for any discharge medications will be authorized once you are discharged, as it is imperative that you return to your primary care physician (or establish a relationship with a primary care physician if you do not have one) for your aftercare needs so that they can reassess your need for medications and monitor your lab values.   Increase activity slowly   Complete by: As directed      Allergies as of 01/08/2020      Reactions   Iodine Hives, Other (See Comments)   Omnipaque [iohexol] Other (See Comments)   IV Dye-Sneezing      Medication List    STOP taking these medications   ALPRAZolam 0.5 MG tablet Commonly known as:  XANAX   naproxen sodium 220 MG tablet Commonly known as: ALEVE   predniSONE 10 MG tablet Commonly known as: DELTASONE     TAKE these medications   dexamethasone 6 MG tablet Commonly known as: DECADRON Take 1 tablet (6 mg total) by mouth daily. Start taking on: January 09, 2020   famotidine 20 MG tablet Commonly known as: PEPCID Take 1 tablet (20 mg total) by mouth 2 (two) times daily for 10 days.   folic acid 1 MG tablet Commonly known as: FOLVITE Take 1 tablet (1 mg total) by mouth daily.   gabapentin 600 MG tablet Commonly known as: NEURONTIN Take 1 tablet (600 mg total) by mouth 3 (three) times daily.   nicotine 21 mg/24hr patch Commonly known as: NICODERM CQ - dosed in mg/24 hours Place 1 patch (21 mg total) onto the skin daily. Start taking on: January 09, 2020   nitroGLYCERIN 0.4 MG SL tablet Commonly known as: NITROSTAT Place 1 tablet (0.4 mg total) under the tongue every 5 (five) minutes as needed for chest pain. Reported on 02/25/2016   ondansetron 4 MG disintegrating tablet Commonly known as: Zofran ODT Take 1 tablet (4 mg total) by mouth every 8 (eight) hours as needed for nausea or vomiting.   Symbicort 160-4.5 MCG/ACT inhaler Generic drug: budesonide-formoterol Inhale 2 puffs into the lungs in the morning and at bedtime.   thiamine 100 MG tablet Commonly known as: VITAMIN B-1 Take 1 tablet (100 mg total) by mouth daily.         Diet and Activity recommendation: See Discharge Instructions above   Consults obtained -  None   Major procedures and Radiology Reports - PLEASE review detailed and final reports for all details, in brief -      DG Chest Port 1 View  Result Date: 01/04/2020 CLINICAL DATA:  Shortness of breath and fever. EXAM: PORTABLE CHEST 1 VIEW COMPARISON:  Chest x-ray 05/19/2019 FINDINGS: The heart is borderline enlarged but stable. There is moderate tortuosity of the  thoracic aorta. Underlying emphysematous changes and pulmonary  scarring with suspected patchy bibasilar infiltrates. No definite pleural effusions. IMPRESSION: Underlying emphysematous changes and pulmonary scarring with suspected patchy bibasilar infiltrates. Electronically Signed   By: Marijo Sanes M.D.   On: 01/04/2020 12:07    Micro Results     Recent Results (from the past 240 hour(s))  Blood Culture (routine x 2)     Status: None (Preliminary result)   Collection Time: 01/04/20 11:00 AM   Specimen: BLOOD RIGHT ARM  Result Value Ref Range Status   Specimen Description BLOOD RIGHT ARM  Final   Special Requests   Final    BOTTLES DRAWN AEROBIC AND ANAEROBIC Blood Culture adequate volume   Culture NO GROWTH 4 DAYS  Final   Report Status PENDING  Incomplete  Respiratory Panel by RT PCR (Flu A&B, Covid) - Nasopharyngeal Swab     Status: Abnormal   Collection Time: 01/04/20 11:49 AM   Specimen: Nasopharyngeal Swab  Result Value Ref Range Status   SARS Coronavirus 2 by RT PCR POSITIVE (A) NEGATIVE Final    Comment: RESULT CALLED TO, READ BACK BY AND VERIFIED WITH: Merita Norton RN 14:50 01/04/20 ([email or web address]) (NOTE) SARS-CoV-2 target nucleic acids are DETECTED. SARS-CoV-2 RNA is generally detectable in upper respiratory specimens  during the acute phase of infection. Positive results are indicative of the presence of the identified virus, but do not rule out bacterial infection or co-infection with other pathogens not detected by the test. Clinical correlation with patient history and other diagnostic information is necessary to determine patient infection status. The expected result is Negative. Fact Sheet for Patients:  PinkCheek.be Fact Sheet for Healthcare Providers: GravelBags.it This test is not yet approved or cleared by the [place] FDA and  has been authorized for detection and/or diagnosis of SARS-CoV-2 by FDA under an Emergency Use Authorization (EUA).  This EUA will remain in  effect (meaning this test can be used) fo r the duration of  the COVID-19 declaration under Section 564(b)(1) of the Act, 21 U.S.C. section 360bbb-3(b)(1), unless the authorization is terminated or revoked sooner.    Influenza A by PCR NEGATIVE NEGATIVE Final   Influenza B by PCR NEGATIVE NEGATIVE Final    Comment: (NOTE) The Xpert Xpress SARS-CoV-2/FLU/RSV assay is intended as an aid in  the diagnosis of influenza from Nasopharyngeal swab specimens and  should not be used as a sole basis for treatment. Nasal washings and  aspirates are unacceptable for Xpert Xpress SARS-CoV-2/FLU/RSV  testing. Fact Sheet for Patients: PinkCheek.be Fact Sheet for Healthcare Providers: GravelBags.it This test is not yet approved or cleared by the [place] FDA and  has been authorized for detection and/or diagnosis of SARS-CoV-2 by  FDA under an Emergency Use Authorization (EUA). This EUA will remain  in effect (meaning this test can be used) for the duration of the  Covid-19 declaration under Section 564(b)(1) of the Act, 21  U.S.C. section 360bbb-3(b)(1), unless the authorization is  terminated or revoked. Performed at [facility], [place] [address] [place], [place] [address]   Blood Culture (routine x 2)     Status: None (Preliminary result)   Collection Time: 01/04/20 12:49 PM   Specimen: BLOOD RIGHT HAND  Result Value Ref Range Status   Specimen Description BLOOD RIGHT HAND  Final   Special Requests   Final    BOTTLES DRAWN AEROBIC AND ANAEROBIC Blood Culture adequate volume   Culture NO GROWTH 4 DAYS  Final  Report Status PENDING  Incomplete       Today   Subjective:   Brent Byrd today has no headache,no chest abdominal pain,no new weakness tingling or numbness, feels much better wants to go home today.   Objective:   Blood pressure 130/88, pulse 60, temperature 98.7 F (37.1 C), temperature source Oral, resp.  rate 17, height 6\' 4"  (1.93 m), weight 120.9 kg, SpO2 94 %.   Intake/Output Summary (Last 24 hours) at 01/08/2020 1017 Last data filed at 01/08/2020 0947 Gross per 24 hour  Intake 1200 ml  Output 200 ml  Net 1000 ml    Exam Awake Alert, Oriented x 3, No new F.N deficits, Normal affect Symmetrical Chest wall movement, Good air movement bilaterally, CTAB RRR,No Gallops,Rubs or new Murmurs, No Parasternal Heave +ve B.Sounds, Abd Soft, Non tender, No rebound -guarding or rigidity. No Cyanosis, Clubbing or edema, No new Rash or bruise  Data Review   CBC w Diff:  Lab Results  Component Value Date   WBC 9.2 01/08/2020   HGB 12.6 (L) 01/08/2020   HGB 14.0 07/29/2014   HCT 38.1 (L) 01/08/2020   HCT 43.9 07/29/2014   PLT 199 01/08/2020   PLT 208 07/29/2014   LYMPHOPCT 23 01/08/2020   LYMPHOPCT 35.4 07/16/2014   MONOPCT 8 01/08/2020   MONOPCT 10.0 07/16/2014   EOSPCT 1 01/08/2020   EOSPCT 4.2 07/16/2014   BASOPCT 0 01/08/2020   BASOPCT 1.3 07/16/2014    CMP:  Lab Results  Component Value Date   NA 138 01/08/2020   NA 138 09/20/2019   NA 138 07/29/2014   K 4.0 01/08/2020   K 3.9 07/29/2014   CL 106 01/08/2020   CL 108 (H) 07/29/2014   CO2 24 01/08/2020   CO2 21 07/29/2014   BUN 14 01/08/2020   BUN 11 09/20/2019   BUN 10 07/29/2014   CREATININE 1.09 01/08/2020   CREATININE 0.92 07/29/2014   PROT 6.1 (L) 01/08/2020   PROT 6.8 09/20/2019   PROT 8.2 07/29/2014   ALBUMIN 2.9 (L) 01/08/2020   ALBUMIN 3.9 09/20/2019   ALBUMIN 4.1 07/29/2014   BILITOT 0.4 01/08/2020   BILITOT 0.2 09/20/2019   BILITOT 0.5 07/29/2014   ALKPHOS 55 01/08/2020   ALKPHOS 74 07/29/2014   AST 19 01/08/2020   AST 16 07/29/2014   ALT 32 01/08/2020   ALT 26 07/29/2014  .   Total Time in preparing paper work, data evaluation and todays exam - 39 minutes  Phillips Climes M.D on 01/08/2020 at 10:17 AM  [facility]   Office  [phone]

## 2020-01-08 NOTE — TOC Progression Note (Addendum)
Transition of Care [facility]) - Progression Note    Patient Details  Name: Brent Byrd MRN: [id number] Date of Birth: January 04, 1956  [facility] CM/SW [place], [place] Phone Number: 01/08/2020, 12:26 PM  Clinical Narrative:    Update: Patient accepted. Transportation scheduled for 3pm.  CSW provided [employer] [employer] documentation to nurse, for patient to complete. CSW faxed [phone] documents to Epimenio Sarin with the program. CSW will be contacted when it is confirmed that patient is able to admit to the hotel and transportation has been set up.     Expected Discharge Plan and Services           Expected Discharge Date: 01/08/20                                     Social Determinants of Health (SDOH) Interventions    Readmission Risk Interventions No flowsheet data found.

## 2020-01-08 NOTE — Plan of Care (Signed)

## 2020-01-08 NOTE — Progress Notes (Signed)
SATURATION QUALIFICATIONS: (This note is used to comply with regulatory documentation for home oxygen)  Patient Saturations on Room Air at Rest = 96%  Patient Saturations on Room Air while Ambulating = 92%   Patient does not require oxygen while ambulating.

## 2020-01-08 NOTE — Discharge Instructions (Signed)
Person Under Monitoring Name: Brent Byrd  Location: [id number]   Infection Prevention Recommendations for Individuals Confirmed to have, or Being Evaluated for, 2019 Novel Coronavirus (COVID-19) Infection Who Receive Care at Home  Individuals who are confirmed to have, or are being evaluated for, COVID-19 should follow the prevention steps below until a healthcare provider or local or state health department says they can return to normal activities.  Stay home except to get medical care You should restrict activities outside your home, except for getting medical care. Do not go to work, school, or public areas, and do not use public transportation or taxis.  Call ahead before visiting your doctor Before your medical appointment, call the healthcare provider and tell them that you have, or are being evaluated for, COVID-19 infection. This will help the healthcare providers office take steps to keep other people from getting infected. Ask your healthcare provider to call the local or state health department.  Monitor your symptoms Seek prompt medical attention if your illness is worsening (e.g., difficulty breathing). Before going to your medical appointment, call the healthcare provider and tell them that you have, or are being evaluated for, COVID-19 infection. Ask your healthcare provider to call the local or state health department.  Wear a facemask You should wear a facemask that covers your nose and mouth when you are in the same room with other people and when you visit a healthcare provider. People who live with or visit you should also wear a facemask while they are in the same room with you.  Separate yourself from other people in your home As much as possible, you should stay in a different room from other people in your home. Also, you should use a separate bathroom, if available.  Avoid sharing household items You should not  share dishes, drinking glasses, cups, eating utensils, towels, bedding, or other items with other people in your home. After using these items, you should wash them thoroughly with soap and water.  Cover your coughs and sneezes Cover your mouth and nose with a tissue when you cough or sneeze, or you can cough or sneeze into your sleeve. Throw used tissues in a lined trash can, and immediately wash your hands with soap and water for at least 20 seconds or use an alcohol-based hand rub.  Wash your [facility] your hands often and thoroughly with soap and water for at least 20 seconds. You can use an alcohol-based hand sanitizer if soap and water are not available and if your hands are not visibly dirty. Avoid touching your eyes, nose, and mouth with unwashed hands.   Prevention Steps for Caregivers and Household Members of Individuals Confirmed to have, or Being Evaluated for, COVID-19 Infection Being Cared for in the Home  If you live with, or provide care at home for, a person confirmed to have, or being evaluated for, COVID-19 infection please follow these guidelines to prevent infection:  Follow healthcare providers instructions Make sure that you understand and can help the patient follow any healthcare provider instructions for all care.  Provide for the patients basic needs You should help the patient with basic needs in the home and provide support for getting groceries, prescriptions, and other personal needs.  Monitor the patients symptoms If they are getting sicker, call his or her medical provider and tell them that the patient has, or is being evaluated for, COVID-19 infection. This will help the healthcare providers office  take steps to keep other people from getting infected. Ask the healthcare provider to call the local or state health department.  Limit the number of people who have contact with the patient  If possible, have only one caregiver for the  patient.  Other household members should stay in another home or place of residence. If this is not possible, they should stay  in another room, or be separated from the patient as much as possible. Use a separate bathroom, if available.  Restrict visitors who do not have an essential need to be in the home.  Keep older adults, very young children, and other sick people away from the patient Keep older adults, very young children, and those who have compromised immune systems or chronic health conditions away from the patient. This includes people with chronic heart, lung, or kidney conditions, diabetes, and cancer.  Ensure good ventilation Make sure that shared spaces in the home have good air flow, such as from an air conditioner or an opened window, weather permitting.  Wash your hands often  Wash your hands often and thoroughly with soap and water for at least 20 seconds. You can use an alcohol based hand sanitizer if soap and water are not available and if your hands are not visibly dirty.  Avoid touching your eyes, nose, and mouth with unwashed hands.  Use disposable paper towels to dry your hands. If not available, use dedicated cloth towels and replace them when they become wet.  Wear a facemask and gloves  Wear a disposable facemask at all times in the room and gloves when you touch or have contact with the patients blood, body fluids, and/or secretions or excretions, such as sweat, saliva, sputum, nasal mucus, vomit, urine, or feces.  Ensure the mask fits over your nose and mouth tightly, and do not touch it during use.  Throw out disposable facemasks and gloves after using them. Do not reuse.  Wash your hands immediately after removing your facemask and gloves.  If your personal clothing becomes contaminated, carefully remove clothing and launder. Wash your hands after handling contaminated clothing.  Place all used disposable facemasks, gloves, and other waste in a lined  container before disposing them with other household waste.  Remove gloves and wash your hands immediately after handling these items.  Do not share dishes, glasses, or other household items with the patient  Avoid sharing household items. You should not share dishes, drinking glasses, cups, eating utensils, towels, bedding, or other items with a patient who is confirmed to have, or being evaluated for, COVID-19 infection.  After the person uses these items, you should wash them thoroughly with soap and water.  Wash laundry thoroughly  Immediately remove and wash clothes or bedding that have blood, body fluids, and/or secretions or excretions, such as sweat, saliva, sputum, nasal mucus, vomit, urine, or feces, on them.  Wear gloves when handling laundry from the patient.  Read and follow directions on labels of laundry or clothing items and detergent. In general, wash and dry with the warmest temperatures recommended on the label.  Clean all areas the individual has used often  Clean all touchable surfaces, such as counters, tabletops, doorknobs, bathroom fixtures, toilets, phones, keyboards, tablets, and bedside tables, every day. Also, clean any surfaces that may have blood, body fluids, and/or secretions or excretions on them.  Wear gloves when cleaning surfaces the patient has come in contact with.  Use a diluted bleach solution (e.g., dilute bleach with 1 part  bleach and 10 parts water) or a household disinfectant with a label that says EPA-registered for coronaviruses. To make a bleach solution at home, add 1 tablespoon of bleach to 1 quart (4 cups) of water. For a larger supply, add  cup of bleach to 1 gallon (16 cups) of water.  Read labels of cleaning products and follow recommendations provided on product labels. Labels contain instructions for safe and effective use of the cleaning product including precautions you should take when applying the product, such as wearing gloves or  eye protection and making sure you have good ventilation during use of the product.  Remove gloves and wash hands immediately after cleaning.  Monitor yourself for signs and symptoms of illness Caregivers and household members are considered close contacts, should monitor their health, and will be asked to limit movement outside of the home to the extent possible. Follow the monitoring steps for close contacts listed on the symptom monitoring form.   ? If you have additional questions, contact your local health department or call the epidemiologist on call at [phone] (available 24/7). ? This guidance is subject to change. For the most up-to-date guidance from [facility], please refer to their website: [email or web address]

## 2020-01-09 LAB — CULTURE, BLOOD (ROUTINE X 2)
Culture: NO GROWTH
Culture: NO GROWTH
Special Requests: ADEQUATE
Special Requests: ADEQUATE

## 2020-05-09 ENCOUNTER — Encounter (HOSPITAL_COMMUNITY): Payer: Self-pay | Admitting: Emergency Medicine

## 2020-05-09 ENCOUNTER — Other Ambulatory Visit: Payer: Self-pay

## 2020-05-09 ENCOUNTER — Emergency Department (HOSPITAL_COMMUNITY)
Admission: EM | Admit: 2020-05-09 | Discharge: 2020-05-09 | Disposition: A | Discharge status: Home / Self Care | Payer: Medicaid Other | Attending: Emergency Medicine | Admitting: Emergency Medicine

## 2020-05-09 ENCOUNTER — Emergency Department (HOSPITAL_COMMUNITY): Payer: Medicaid Other

## 2020-05-09 DIAGNOSIS — R519 Headache, unspecified: Secondary | ICD-10-CM | POA: Diagnosis not present

## 2020-05-09 DIAGNOSIS — M542 Cervicalgia: Secondary | ICD-10-CM | POA: Diagnosis not present

## 2020-05-09 DIAGNOSIS — Y9389 Activity, other specified: Secondary | ICD-10-CM | POA: Insufficient documentation

## 2020-05-09 DIAGNOSIS — I252 Old myocardial infarction: Secondary | ICD-10-CM | POA: Diagnosis not present

## 2020-05-09 DIAGNOSIS — Z79899 Other long term (current) drug therapy: Secondary | ICD-10-CM | POA: Diagnosis not present

## 2020-05-09 DIAGNOSIS — Z8673 Personal history of transient ischemic attack (TIA), and cerebral infarction without residual deficits: Secondary | ICD-10-CM | POA: Insufficient documentation

## 2020-05-09 DIAGNOSIS — F17210 Nicotine dependence, cigarettes, uncomplicated: Secondary | ICD-10-CM | POA: Insufficient documentation

## 2020-05-09 DIAGNOSIS — Y998 Other external cause status: Secondary | ICD-10-CM | POA: Insufficient documentation

## 2020-05-09 DIAGNOSIS — Y929 Unspecified place or not applicable: Secondary | ICD-10-CM | POA: Diagnosis not present

## 2020-05-09 DIAGNOSIS — I10 Essential (primary) hypertension: Secondary | ICD-10-CM | POA: Insufficient documentation

## 2020-05-09 DIAGNOSIS — J449 Chronic obstructive pulmonary disease, unspecified: Secondary | ICD-10-CM | POA: Insufficient documentation

## 2020-05-09 LAB — CBC WITH DIFFERENTIAL/PLATELET
Abs Immature Granulocytes: 0.01 10*3/uL (ref 0.00–0.07)
Basophils Absolute: 0 10*3/uL (ref 0.0–0.1)
Basophils Relative: 1 %
Eosinophils Absolute: 0.2 10*3/uL (ref 0.0–0.5)
Eosinophils Relative: 4 %
HCT: 45 % (ref 39.0–52.0)
Hemoglobin: 14.8 g/dL (ref 13.0–17.0)
Immature Granulocytes: 0 %
Lymphocytes Relative: 37 %
Lymphs Abs: 1.4 10*3/uL (ref 0.7–4.0)
MCH: 32 pg (ref 26.0–34.0)
MCHC: 32.9 g/dL (ref 30.0–36.0)
MCV: 97.2 fL (ref 80.0–100.0)
Monocytes Absolute: 0.6 10*3/uL (ref 0.1–1.0)
Monocytes Relative: 14 %
Neutro Abs: 1.8 10*3/uL (ref 1.7–7.7)
Neutrophils Relative %: 44 %
Platelets: 167 10*3/uL (ref 150–400)
RBC: 4.63 MIL/uL (ref 4.22–5.81)
RDW: 14.5 % (ref 11.5–15.5)
WBC: 3.9 10*3/uL — ABNORMAL LOW (ref 4.0–10.5)
nRBC: 0 % (ref 0.0–0.2)

## 2020-05-09 LAB — BASIC METABOLIC PANEL
Anion gap: 13 (ref 5–15)
BUN: 9 mg/dL (ref 8–23)
CO2: 23 mmol/L (ref 22–32)
Calcium: 9.2 mg/dL (ref 8.9–10.3)
Chloride: 102 mmol/L (ref 98–111)
Creatinine, Ser: 0.77 mg/dL (ref 0.61–1.24)
GFR calc Af Amer: >60 mL/min (ref >60)
GFR calc non Af Amer: >60 mL/min (ref >60)
Glucose, Bld: 94 mg/dL (ref 70–99)
Potassium: 4.1 mmol/L (ref 3.5–5.1)
Sodium: 138 mmol/L (ref 135–145)

## 2020-05-09 MED ORDER — METOCLOPRAMIDE HCL 5 MG/ML IJ SOLN
10.0000 mg | Freq: Once | INTRAMUSCULAR | Status: AC
  Administered 2020-05-09: 10 mg via INTRAVENOUS
  Filled 2020-05-09: qty 2

## 2020-05-09 MED ORDER — ACETAMINOPHEN 325 MG PO TABS
650.0000 mg | ORAL_TABLET | Freq: Once | ORAL | Status: AC
  Administered 2020-05-09: 650 mg via ORAL
  Filled 2020-05-09: qty 2

## 2020-05-09 MED ORDER — DIPHENHYDRAMINE HCL 50 MG/ML IJ SOLN
25.0000 mg | Freq: Once | INTRAMUSCULAR | Status: AC
  Administered 2020-05-09: 25 mg via INTRAVENOUS
  Filled 2020-05-09: qty 1

## 2020-05-09 MED ORDER — OXYCODONE-ACETAMINOPHEN 5-325 MG PO TABS
2.0000 | ORAL_TABLET | Freq: Once | ORAL | Status: AC
  Administered 2020-05-09: 2 via ORAL
  Filled 2020-05-09: qty 2
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
## 2020-05-09 NOTE — Discharge Instructions (Signed)
Follow up with neurology given your recurrent headaches.  Return for new or worsening symptoms

## 2020-07-11 ENCOUNTER — Encounter: Payer: Self-pay | Admitting: Neurology

## 2020-07-11 ENCOUNTER — Telehealth: Payer: Self-pay | Admitting: Neurology

## 2020-07-11 ENCOUNTER — Institutional Professional Consult (permissible substitution): Payer: Medicaid Other | Admitting: Neurology

## 2020-07-11 NOTE — Telephone Encounter (Signed)
This is the second revisit no-show for this patient, the first was on the 30 November 2019

## 2020-07-24 ENCOUNTER — Encounter: Payer: Self-pay | Admitting: Neurology

## 2020-07-24 ENCOUNTER — Ambulatory Visit: Payer: Medicaid Other | Admitting: Neurology

## 2020-07-24 ENCOUNTER — Other Ambulatory Visit: Payer: Self-pay

## 2020-07-24 DIAGNOSIS — G4489 Other headache syndrome: Secondary | ICD-10-CM | POA: Diagnosis not present

## 2020-07-24 MED ORDER — GABAPENTIN 300 MG PO CAPS
ORAL_CAPSULE | ORAL | 3 refills | Status: DC
Start: 2020-07-24 — End: 2020-11-05

## 2020-07-24 NOTE — Patient Instructions (Signed)
We will start gabapentin for the headache.  Neurontin (gabapentin) may result in drowsiness, ankle swelling, gait instability, or possibly dizziness. Please contact our office if significant side effects occur with this medication.
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
## 2020-08-22 ENCOUNTER — Ambulatory Visit (HOSPITAL_COMMUNITY)
Admission: EM | Admit: 2020-08-22 | Discharge: 2020-08-22 | Disposition: A | Discharge status: Home / Self Care | Payer: Medicaid Other | Attending: Family Medicine | Admitting: Family Medicine

## 2020-08-22 ENCOUNTER — Other Ambulatory Visit: Payer: Self-pay

## 2020-08-22 ENCOUNTER — Encounter (HOSPITAL_COMMUNITY): Payer: Self-pay | Admitting: Emergency Medicine

## 2020-08-22 DIAGNOSIS — M25562 Pain in left knee: Secondary | ICD-10-CM

## 2020-08-22 MED ORDER — PREDNISONE 20 MG PO TABS
40.0000 mg | ORAL_TABLET | Freq: Every day | ORAL | 0 refills | Status: DC
Start: 2020-08-22 — End: 2022-04-07

## 2020-08-22 MED ORDER — HYDROCODONE-ACETAMINOPHEN 5-325 MG PO TABS: 1.0000 | ORAL_TABLET | Freq: Four times a day (QID) | ORAL | 0 refills | Status: DC | PRN

## 2020-08-22 NOTE — Discharge Instructions (Signed)
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
## 2020-10-25 ENCOUNTER — Encounter (HOSPITAL_COMMUNITY): Payer: Self-pay | Admitting: Emergency Medicine

## 2020-10-25 ENCOUNTER — Emergency Department (HOSPITAL_COMMUNITY)
Admission: EM | Admit: 2020-10-25 | Discharge: 2020-10-26 | Disposition: A | Discharge status: Home / Self Care | Payer: Medicaid Other | Attending: Emergency Medicine | Admitting: Emergency Medicine

## 2020-10-25 ENCOUNTER — Emergency Department (HOSPITAL_COMMUNITY): Payer: Medicaid Other

## 2020-10-25 DIAGNOSIS — Z5321 Procedure and treatment not carried out due to patient leaving prior to being seen by health care provider: Secondary | ICD-10-CM | POA: Diagnosis not present

## 2020-10-25 DIAGNOSIS — R109 Unspecified abdominal pain: Secondary | ICD-10-CM | POA: Insufficient documentation

## 2020-10-25 DIAGNOSIS — W108XXA Fall (on) (from) other stairs and steps, initial encounter: Secondary | ICD-10-CM | POA: Insufficient documentation

## 2020-10-25 NOTE — ED Notes (Signed)
Patient placed on O2 2 L/min via [place] and sats rose to 91%.  Patient placed on O2 3L/min and Sats increased to 93%
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
## 2020-10-26 NOTE — ED Notes (Signed)
Pt states that he is leaving. Does not want to wait to see a provider. Ambulated out of ED in no distress.

## 2020-11-05 ENCOUNTER — Other Ambulatory Visit: Payer: Self-pay

## 2020-11-05 ENCOUNTER — Encounter: Payer: Self-pay | Admitting: Neurology

## 2020-11-05 ENCOUNTER — Ambulatory Visit: Payer: Medicaid Other | Admitting: Neurology

## 2020-11-05 VITALS — BP 120/85 | HR 77 | Ht 76.0 in | Wt 275.0 lb

## 2020-11-05 DIAGNOSIS — G4489 Other headache syndrome: Secondary | ICD-10-CM

## 2020-11-05 NOTE — Patient Instructions (Signed)
Glad your headaches are improved! Remain off the gabapentin Continue follow-up with primary doctor See you back as needed
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
## 2020-11-08 NOTE — Progress Notes (Signed)
I have read the note, and I agree with the clinical assessment and plan.  Charles K Willis   

## 2020-11-26 ENCOUNTER — Other Ambulatory Visit: Payer: Self-pay | Admitting: Internal Medicine

## 2020-11-27 LAB — COMPLETE METABOLIC PANEL WITH GFR
AG Ratio: 1.6 (calc) (ref 1.0–2.5)
ALT: 15 U/L (ref 9–46)
AST: 13 U/L (ref 10–35)
Albumin: 4.2 g/dL (ref 3.6–5.1)
Alkaline phosphatase (APISO): 66 U/L (ref 35–144)
BUN: 11 mg/dL (ref 7–25)
CO2: 25 mmol/L (ref 20–32)
Calcium: 9.1 mg/dL (ref 8.6–10.3)
Chloride: 105 mmol/L (ref 98–110)
Creat: 1.06 mg/dL (ref 0.70–1.25)
GFR, Est African American: 86 mL/min/{1.73_m2} (ref >=60)
GFR, Est Non African American: 74 mL/min/{1.73_m2} (ref >=60)
Globulin: 2.7 g/dL (calc) (ref 1.9–3.7)
Glucose, Bld: 105 mg/dL — ABNORMAL HIGH (ref 65–99)
Potassium: 4.5 mmol/L (ref 3.5–5.3)
Sodium: 138 mmol/L (ref 135–146)
Total Bilirubin: 0.3 mg/dL (ref 0.2–1.2)
Total Protein: 6.9 g/dL (ref 6.1–8.1)

## 2020-11-27 LAB — CBC
HCT: 39.5 % (ref 38.5–50.0)
Hemoglobin: 13.3 g/dL (ref 13.2–17.1)
MCH: 31.6 pg (ref 27.0–33.0)
MCHC: 33.7 g/dL (ref 32.0–36.0)
MCV: 93.8 fL (ref 80.0–100.0)
MPV: 10.1 fL (ref 7.5–12.5)
Platelets: 236 10*3/uL (ref 140–400)
RBC: 4.21 10*6/uL (ref 4.20–5.80)
RDW: 12.8 % (ref 11.0–15.0)
WBC: 4.9 10*3/uL (ref 3.8–10.8)

## 2020-11-27 LAB — LIPID PANEL
Cholesterol: 172 mg/dL (ref <200)
HDL: 46 mg/dL (ref >=40)
LDL Cholesterol (Calc): 103 mg/dL (calc) — ABNORMAL HIGH
Non-HDL Cholesterol (Calc): 126 mg/dL (calc) (ref <130)
Total CHOL/HDL Ratio: 3.7 (calc) (ref <5.0)
Triglycerides: 124 mg/dL (ref <150)

## 2020-11-27 LAB — VITAMIN D 25 HYDROXY (VIT D DEFICIENCY, FRACTURES): Vit D, 25-Hydroxy: 16 ng/mL — ABNORMAL LOW (ref 30–100)

## 2020-11-27 LAB — TSH: TSH: 2.49 mIU/L (ref 0.40–4.50)

## 2020-11-27 LAB — PSA: PSA: 0.1 ng/mL (ref <=4.0)

## 2021-07-02 ENCOUNTER — Encounter: Payer: Self-pay | Admitting: Gastroenterology

## 2021-12-08 ENCOUNTER — Ambulatory Visit (INDEPENDENT_AMBULATORY_CARE_PROVIDER_SITE_OTHER): Payer: Medicaid Other | Admitting: Podiatry

## 2021-12-08 ENCOUNTER — Encounter: Payer: Self-pay | Admitting: Podiatry

## 2021-12-08 ENCOUNTER — Other Ambulatory Visit: Payer: Self-pay

## 2021-12-08 DIAGNOSIS — L84 Corns and callosities: Secondary | ICD-10-CM | POA: Diagnosis not present

## 2021-12-08 DIAGNOSIS — M2041 Other hammer toe(s) (acquired), right foot: Secondary | ICD-10-CM

## 2021-12-08 DIAGNOSIS — M2042 Other hammer toe(s) (acquired), left foot: Secondary | ICD-10-CM

## 2021-12-08 NOTE — Progress Notes (Signed)
°  Subjective:  Patient ID: Brent Byrd, male    DOB: 1956/02/18,   MRN: [id number]  Chief Complaint  Patient presents with   Toe Pain    Left foot 3rd toe possible callus or corn causing pain and discomfort     66 y.o. male presents for calluses on bilateral feet. Particularly the left third toe and the right big toe. Patient relates he has tried trimming them down but they get very painful. Denies diabetes.  . Denies any other pedal complaints. Denies n/v/f/c.   Past Medical History:  Diagnosis Date   Alcohol abuse    Arthritis    Left knee, lower back   Chest pain    hx   COPD (chronic obstructive pulmonary disease) ([place])    "[place] said I don't have this" (10/26/2016)   Depression    no meds   DVT (deep venous thrombosis) ([place]) 2008   Right leg- txed with coumadin    DVT (deep venous thrombosis) ([place]) 2010; 2017   RLE; RLE   Gastroesophageal reflux disease 2014   Hypertension     per patiente-controlled, was taken off BP med   MI, old ~ 2007   Stroke ([place]) 01/2012   denies residual on 10/26/2016   Substance abuse ([place])    Alcohol dependence Hx   Tobacco abuse     Objective:  Physical Exam: Vascular: DP/PT pulses 2/4 bilateral. CFT <3 seconds. Normal hair growth on digits. No edema.  Skin. No lacerations or abrasions bilateral feet. Hyperkeratotic tissue noted sub right hallux and left third digit distally.  Musculoskeletal: MMT 5/5 bilateral lower extremities in DF, PF, Inversion and Eversion. Deceased ROM in DF of ankle joint. Hammered digitis 2-5 bilateral. Left third digit flexible and reducible. Bilateral HAV deformities noted.  Neurological: Sensation intact to light touch.   Assessment:   1. Corns and callosities   2. Hammertoe, bilateral      Plan:  Patient was evaluated and treated and all questions answered. -Discussed corns and calluses with patient and treatment options.  -Hyperkeratotic tissue was debrided with chisel without incident.  -Applied  salycylic acid treatment to area with dressing. Advised to remove bandaging tomorrow.  -Encouraged daily moisturizing -Discussed use of pumice stone -Advised good supportive shoes and inserts -Discussed left foot hammertoes and callus. Discussed trying a flexor tenotomy to reduce callus on one toe. Patient will follow-up in about three weeks for flexor tenotomy of left third digit.  -Patient to return to office as needed or sooner if condition worsens.   Lorenda Peck, DPM

## 2021-12-30 ENCOUNTER — Encounter: Payer: Self-pay | Admitting: Podiatry

## 2021-12-30 ENCOUNTER — Other Ambulatory Visit: Payer: Self-pay

## 2021-12-30 ENCOUNTER — Ambulatory Visit: Payer: Medicaid Other | Admitting: Podiatry

## 2021-12-30 DIAGNOSIS — L84 Corns and callosities: Secondary | ICD-10-CM

## 2021-12-30 DIAGNOSIS — M2041 Other hammer toe(s) (acquired), right foot: Secondary | ICD-10-CM | POA: Diagnosis not present

## 2021-12-30 NOTE — Progress Notes (Signed)
?  Subjective:  ?Patient ID: Brent Byrd, male    DOB: 1956/10/05,   MRN: [id number] ? ?Chief Complaint  ?Patient presents with  ? Callouses  ? ? ?66 y.o. male presents for tenotomy on left third digit due to callus on the end of this digit.  . Denies any other pedal complaints. Denies n/v/f/c.  ? ?Past Medical History:  ?Diagnosis Date  ? Alcohol abuse   ? Arthritis   ? Left knee, lower back  ? Chest pain   ? hx  ? COPD (chronic obstructive pulmonary disease) ([place])   ? "[place] said I don't have this" (10/26/2016)  ? Depression   ? no meds  ? DVT (deep venous thrombosis) ([place]) 2008  ? Right leg- txed with coumadin   ? DVT (deep venous thrombosis) ([place]) 2010; 2017  ? RLE; RLE  ? Gastroesophageal reflux disease 2014  ? Hypertension   ?  per patiente-controlled, was taken off BP med  ? MI, old ~ 2007  ? Stroke [facility]) 01/2012  ? denies residual on 10/26/2016  ? Substance abuse ([place])   ? Alcohol dependence Hx  ? Tobacco abuse   ? ? ?Objective:  ?Physical Exam: ?Vascular: DP/PT pulses 2/4 bilateral. CFT <3 seconds. Normal hair growth on digits. No edema.  ?Skin. No lacerations or abrasions bilateral feet. Hyperkeratotic tissue noted sub right hallux and left third digit distally.  ?Musculoskeletal: MMT 5/5 bilateral lower extremities in DF, PF, Inversion and Eversion. Deceased ROM in DF of ankle joint. Hammered digitis 2-5 bilateral. Left third digit flexible and reducible. Bilateral HAV deformities noted.  ?Neurological: Sensation intact to light touch.  ? ?Assessment:  ? ?1. Hammertoe, bilateral   ?2. Corns and callosities   ? ? ? ? ?Plan:  ?Patient was evaluated and treated and all questions answered. ?-Here today for flexor tenotomy of left third digit due to distal callus.  ?-Procedure below.  ?-Patient to keep dressing intact until follow-up in one week.  ?-Tyelnol or NSAIDS as needed for pain relief.  ?Patient to follow-up in one week  ? ?Procedure: Flexor Tenotomy ?Indication for Procedure: toe with semi-reducible  hammertoe with distal tip ulceration. Flexor tenotomy indicated to alleviate contracture, reduce pressure, and enhance healing of the ulceration. ?Location: left, 3rd toe ?Anesthesia: Lidocaine 1% plain; 32m digital block ?Instrumentation: 18 gauge needle  ?Technique: The toe was anesthetized as above and prepped in the usual fashion. The toe was exanquinated and a tourniquet was secured at the base of the toe. A 18 gauge needle was then used to make a transverse incision over the plantar aspect of the distal interphalangeal joint. The flexor tendon was incised with noted release of the hammertoe deformity. The incision was then irrigated and dressed with sterile dressing. Patient tolerated the procedure well. ?Dressing: Dry, sterile, compression dressing. ?Disposition: Patient tolerated procedure well. Patient to return in 1 week for follow-up. ? ? ? ? ?RLorenda Peck DPM  ? ? ?

## 2022-01-07 ENCOUNTER — Ambulatory Visit (INDEPENDENT_AMBULATORY_CARE_PROVIDER_SITE_OTHER): Payer: Medicaid Other | Admitting: Podiatry

## 2022-01-07 ENCOUNTER — Other Ambulatory Visit: Payer: Self-pay

## 2022-01-07 ENCOUNTER — Encounter: Payer: Self-pay | Admitting: Podiatry

## 2022-01-07 DIAGNOSIS — L84 Corns and callosities: Secondary | ICD-10-CM

## 2022-01-07 DIAGNOSIS — M2042 Other hammer toe(s) (acquired), left foot: Secondary | ICD-10-CM

## 2022-01-07 DIAGNOSIS — M2041 Other hammer toe(s) (acquired), right foot: Secondary | ICD-10-CM

## 2022-01-07 NOTE — Progress Notes (Signed)
?  Subjective:  ?Patient ID: Brent Byrd, male    DOB: October 17, 1956,   MRN: [id number] ? ?Chief Complaint  ?Patient presents with  ? Hammer Toe  ?    1 weeks for procedure-tenotomy POST OP   ? ? ?66 y.o. male presents for follow-up of tenotomy on left third digit due to callus on the end of this digit.  . Denies any other pedal complaints. Denies n/v/f/c.  ? ?Past Medical History:  ?Diagnosis Date  ? Alcohol abuse   ? Arthritis   ? Left knee, lower back  ? Chest pain   ? hx  ? COPD (chronic obstructive pulmonary disease) ([place])   ? "[place] said I don't have this" (10/26/2016)  ? Depression   ? no meds  ? DVT (deep venous thrombosis) ([place]) 2008  ? Right leg- txed with coumadin   ? DVT (deep venous thrombosis) ([place]) 2010; 2017  ? RLE; RLE  ? Gastroesophageal reflux disease 2014  ? Hypertension   ?  per patiente-controlled, was taken off BP med  ? MI, old ~ 2007  ? Stroke [facility]) 01/2012  ? denies residual on 10/26/2016  ? Substance abuse ([place])   ? Alcohol dependence Hx  ? Tobacco abuse   ? ? ?Objective:  ?Physical Exam: ?Vascular: DP/PT pulses 2/4 bilateral. CFT <3 seconds. Normal hair growth on digits. No edema.  ?Skin. No lacerations or abrasions bilateral feet. Hyperkeratotic tissue noted sub right hallux and left third digit distally improved. Plantar third digit incision healed well.  ?Musculoskeletal: MMT 5/5 bilateral lower extremities in DF, PF, Inversion and Eversion. Deceased ROM in DF of ankle joint. Hammered digitis 2-5 bilateral. Left third digit flexible and reducible. Bilateral HAV deformities noted.  ?Neurological: Sensation intact to light touch.  ? ?Assessment:  ? ?1. Corns and callosities   ?2. Hammertoe, bilateral   ? ? ? ? ? ?Plan:  ?Patient was evaluated and treated and all questions answered. ?Toe was evaluated and appears to be healing well.  ?No need to dress.  ?Continue with callus care for other areas.  ?Patient to follow-up as needed.  ? ? ? ? ? ?Lorenda Peck, DPM  ? ? ?

## 2022-02-13 ENCOUNTER — Encounter (HOSPITAL_COMMUNITY): Payer: Self-pay | Admitting: Anesthesiology

## 2022-02-13 NOTE — Progress Notes (Signed)
Patient's chart reviewed with Dr Roanna Banning. Mr Blanch Media will need cardiac  and medical clearances for surgery. Sherri at Dr Luanna Cole office made aware. ?

## 2022-02-24 ENCOUNTER — Ambulatory Visit (HOSPITAL_BASED_OUTPATIENT_CLINIC_OR_DEPARTMENT_OTHER): Admit: 2022-02-24 | Payer: No Typology Code available for payment source | Admitting: Orthopedic Surgery

## 2022-02-24 ENCOUNTER — Encounter (HOSPITAL_BASED_OUTPATIENT_CLINIC_OR_DEPARTMENT_OTHER): Payer: Self-pay

## 2022-02-24 SURGERY — ARTHROSCOPY, KNEE, WITH MEDIAL MENISCECTOMY
Anesthesia: Choice | Site: Knee | Laterality: Left

## 2022-03-26 ENCOUNTER — Encounter: Payer: Self-pay | Admitting: Cardiology

## 2022-03-26 ENCOUNTER — Ambulatory Visit: Payer: Medicaid Other | Admitting: Cardiology

## 2022-03-26 VITALS — BP 118/89 | HR 69 | Temp 97.8°F | Resp 14 | Ht 76.0 in | Wt 303.0 lb

## 2022-03-26 DIAGNOSIS — Z01810 Encounter for preprocedural cardiovascular examination: Secondary | ICD-10-CM

## 2022-03-26 DIAGNOSIS — E782 Mixed hyperlipidemia: Secondary | ICD-10-CM

## 2022-03-26 DIAGNOSIS — R9431 Abnormal electrocardiogram [ECG] [EKG]: Secondary | ICD-10-CM

## 2022-03-26 DIAGNOSIS — F17210 Nicotine dependence, cigarettes, uncomplicated: Secondary | ICD-10-CM

## 2022-03-26 DIAGNOSIS — I10 Essential (primary) hypertension: Secondary | ICD-10-CM

## 2022-03-26 DIAGNOSIS — J449 Chronic obstructive pulmonary disease, unspecified: Secondary | ICD-10-CM

## 2022-03-26 DIAGNOSIS — R0602 Shortness of breath: Secondary | ICD-10-CM

## 2022-03-26 DIAGNOSIS — Z86718 Personal history of other venous thrombosis and embolism: Secondary | ICD-10-CM

## 2022-03-26 DIAGNOSIS — E119 Type 2 diabetes mellitus without complications: Secondary | ICD-10-CM

## 2022-03-26 NOTE — Progress Notes (Signed)
ID:  Brent Byrd, DOB 1956-09-20, MRN [id number]  PCP:  Nolene Ebbs, MD  Cardiologist:  Rex Kras, DO, [facility] (established care 03/26/2022)  REASON FOR CONSULT: Preprocedural risk stratification  REQUESTING PHYSICIAN:  Nolene Ebbs, MD [address] [place],  [place] [address]  Chief Complaint  Patient presents with   Medical Clearance    Left knee   New Patient (Initial Visit)    Referred by Dr. Mardelle Matte    HPI  Brent Byrd is a 66 y.o. African-American male who presents to the clinic for evaluation of preoperative risk stratification at the request of Nolene Ebbs, MD. His past medical history and cardiovascular risk factors include: Hypertension, COPD, hyperlipidemia, smoker, Hx alcohol abuse, history of DVT, history of stroke with left foot drop, non-insulin-dependent diabetes.  Patient is being scheduled for left knee scope, date to be determined, with Dr. Mardelle Matte.  Now referred to cardiology for preoperative risk ratification.  He denies any active chest pain or anginal discomfort.  No known history of valvular heart disease.  He has chronic and stable shortness of breath likely due to prolonged history of smoking and underlying COPD for which he uses an inhaler and no oxygen.  He has non-insulin-dependent diabetes, last A1c 6.5.  And a history of stroke with left foot drop.  Medication list includes sublingual nitroglycerin tablets.  No use of sublingual nitroglycerin tablets in the recent past.  He was initially provided a tablets approximately 5 years ago for chest discomfort and has not needed it to the best of his knowledge.    Recently diagnosed with non-insulin-dependent diabetes, states that he is metformin but does not know the dose, not on statin therapy.  Currently on Eliquis for history of DVT approximately 1 year ago according to the patient, anticoagulation managed by PCP per patient.  FUNCTIONAL STATUS: Before the left knee pain would walk 2 miles w/ his  daughter. Last occurrence atleast 1.5 months ago.    ALLERGIES: Allergies  Allergen Reactions   Iodine Hives and Other (See Comments)   Omnipaque [Iohexol] Other (See Comments)    IV Dye-Sneezing    MEDICATION LIST PRIOR TO VISIT: Current Meds  Medication Sig   albuterol (VENTOLIN HFA) 108 (90 Base) MCG/ACT inhaler Inhale 1-2 puffs into the lungs.   amLODipine (NORVASC) 5 MG tablet Take 5 mg by mouth daily.   ELIQUIS 5 MG TABS tablet Take 5 mg by mouth 2 (two) times daily.   folic acid (FOLVITE) 1 MG tablet Take 1 tablet (1 mg total) by mouth daily.   metFORMIN (GLUCOPHAGE) 500 MG tablet Take by mouth 2 (two) times daily with a meal.   nitroGLYCERIN (NITROSTAT) 0.4 MG SL tablet Place 1 tablet (0.4 mg total) under the tongue every 5 (five) minutes as needed for chest pain. Reported on 02/25/2016     PAST MEDICAL HISTORY: Past Medical History:  Diagnosis Date   Alcohol abuse    Arthritis    Left knee, lower back   Chest pain    hx   COPD (chronic obstructive pulmonary disease) ([place])    "[place] said I don't have this" (10/26/2016)   Depression    no meds   DVT (deep venous thrombosis) ([place]) 2008   Right leg- txed with coumadin    DVT (deep venous thrombosis) ([place]) 2010; 2017   RLE; RLE   Gastroesophageal reflux disease 2014   Hypertension     per patiente-controlled, was taken off BP med   MI, old ~ 2007  Stroke ([place]) 01/2012   denies residual on 10/26/2016   Substance abuse ([place])    Alcohol dependence Hx   Tobacco abuse     PAST SURGICAL HISTORY: Past Surgical History:  Procedure Laterality Date   BACK SURGERY     GANGLION CYST EXCISION Left    HAMMER TOE SURGERY Left 07/2012   2nd toe/08/05/2012   LUMBAR DISC SURGERY  06/2007   Herniated disc/notes 02/25/2011   WISDOM TOOTH EXTRACTION      FAMILY HISTORY: The patient family history includes Asthma in his mother; Cancer in his father and sister; Diabetes in his sister; Thrombosis in his sister.  SOCIAL  HISTORY:  The patient  reports that he has been smoking cigarettes. He has a 22.50 pack-year smoking history. He has never used smokeless tobacco. He reports that he does not currently use drugs after having used the following drugs: "Crack" cocaine and Cocaine.  REVIEW OF SYSTEMS: Review of Systems  Cardiovascular:  Negative for chest pain, dyspnea on exertion, leg swelling, palpitations and syncope.  Musculoskeletal:  Positive for arthritis and joint pain.    PHYSICAL EXAM:    03/26/2022    8:33 AM 11/05/2020    9:24 AM 10/25/2020    2:48 PM  Vitals with BMI  Height 6' 4"  6' 4"  6' 4"   Weight 303 lbs 275 lbs 270 lbs  BMI 36.9 18.56 31.49  Systolic 702 637 858  Diastolic 89 85 88  Pulse 69 77 72    CONSTITUTIONAL: Well-developed and well-nourished. No acute distress.  SKIN: Skin is warm and dry. No rash noted. No cyanosis. No pallor. No jaundice HEAD: Normocephalic and atraumatic.  EYES: No scleral icterus MOUTH/THROAT: Moist oral membranes.  NECK: No JVD present. No thyromegaly noted. No carotid bruits  CHEST Normal respiratory effort. No intercostal retractions  LUNGS: Clear to auscultation bilaterally.  No stridor. No wheezes. No rales.  CARDIOVASCULAR: Regular rate and rhythm, positive S1-S2, no murmurs rubs or gallops appreciated. ABDOMINAL: Obese, soft, nontender, nondistended, positive bowel sounds in all 4 quadrants, no apparent ascites.  EXTREMITIES: Trace bilateral peripheral edema, warm to touch, left knee brace  HEMATOLOGIC: No significant bruising NEUROLOGIC: Oriented to person, place, and time. Nonfocal. Normal muscle tone.  PSYCHIATRIC: Normal mood and affect. Normal behavior. Cooperative  CARDIAC DATABASE: EKG: 03/26/2022: NSR, 61 bpm, consider old anteroseptal infarct, diffuse nonspecific T wave abnormality.   Echocardiogram: No results found for this or any previous visit from the past 1095 days.    Stress Testing: No results found for this or any previous  visit from the past 1095 days.   Heart Catheterization: None  LABORATORY DATA: External Labs: Collected: December 24, 2021 available in Care Everywhere, [facility] health Hemoglobin A1c 6.5 Sodium 137, potassium 4.4, chloride 105, bicarb 27.5. BUN 11, creatinine 0.67. eGFR >60. AST 20, ALT 17, alkaline phosphatase 60 Total cholesterol 175, triglycerides 69, HDL 66, LDL 95, non-HDL 109  IMPRESSION:    ICD-10-CM   1. Preop cardiovascular exam  Z01.810 EKG 12-Lead    PCV ECHOCARDIOGRAM COMPLETE    PCV MYOCARDIAL PERFUSION WITH LEXISCAN    2. Nonspecific abnormal electrocardiogram (ECG) (EKG)  R94.31 PCV ECHOCARDIOGRAM COMPLETE    PCV MYOCARDIAL PERFUSION WITH LEXISCAN    3. Shortness of breath  R06.02 PCV ECHOCARDIOGRAM COMPLETE    PCV MYOCARDIAL PERFUSION WITH LEXISCAN    4. Benign hypertension  I10     5. Non-insulin dependent type 2 diabetes mellitus (HCC)  E11.9 PCV ECHOCARDIOGRAM COMPLETE    6.  Mixed hyperlipidemia  E78.2     7. Cigarette smoker  F17.210     8. History of deep venous thrombosis  Z86.718     9. Chronic obstructive pulmonary disease, unspecified COPD type ([place])  J44.9        RECOMMENDATIONS: Brent Byrd is a 66 y.o. African-American male whose past medical history and cardiac risk factors include: Hypertension, COPD, hyperlipidemia, smoker, Hx alcohol abuse, history of DVT, history of stroke with left foot drop, non-insulin-dependent diabetes.  Preop cardiovascular exam / Nonspecific abnormal electrocardiogram (ECG) (EKG) Denies angina pectoris or heart failure symptoms. Has chronic shortness of breath and audible bleeding on physical examination while at rest. Overall functional capacity has been limited due to left knee pain.  And has multiple cardiovascular risk factors including diabetes, smoking.  Recommend echo and stress test prior to his upcoming elective surgery.  EKG is interpretable but patient is unable to exercise as he has a left knee brace on.   Further recommendations to follow.  Benign hypertension Office blood pressures are well controlled. Currently on amlodipine. Given the diagnosis of diabetes would recommend transitioning him to either ACE or ARB for renal protection. We will defer to PCP  Non-insulin dependent type 2 diabetes mellitus ([place]) Last hemoglobin A1c 6.5. Currently on metformin, according to the patient. Reemphasized importance of glycemic control. Currently managed by primary care provider.  Mixed hyperlipidemia Has a history of hypercholesterolemia. Currently not on statin therapy. Since he is a diabetic would recommend statin therapy as his LDL levels are greater than 70 mg/dL. We will defer to PCP.  Cigarette smoker Tobacco cessation counseling: Currently smoking 1 packs/day   Patient was informed of the dangers of tobacco abuse including stroke, cancer, and MI, as well as benefits of tobacco cessation. Patient is not willing to quit at this time. 5 mins were spent counseling patient cessation techniques. We discussed various methods to help quit smoking, including deciding on a date to quit, joining a support group, pharmacological agents- nicotine gum/patch/lozenges.  I will reassess his progress at the next follow-up visit  History of deep venous thrombosis Currently on Eliquis. Anticoagulation currently being managed by PCP.  Chronic obstructive pulmonary disease, unspecified COPD type ([place]) No history of heavy breathing at rest. Has shortness of breath which is chronic and stable, according to the patient. Is on his inhalers. If clinically warranted would recommend pulmonary evaluation prior to upcoming noncardiac surgery.  FINAL MEDICATION LIST END OF ENCOUNTER: No orders of the defined types were placed in this encounter.   Medications Discontinued During This Encounter  Medication Reason   HYDROcodone-acetaminophen (NORCO/VICODIN) 5-325 MG tablet    omeprazole (PRILOSEC) 20 MG  capsule    ondansetron (ZOFRAN ODT) 4 MG disintegrating tablet    sildenafil (REVATIO) 20 MG tablet    thiamine (VITAMIN B-1) 100 MG tablet      Current Outpatient Medications:    albuterol (VENTOLIN HFA) 108 (90 Base) MCG/ACT inhaler, Inhale 1-2 puffs into the lungs., Disp: , Rfl:    amLODipine (NORVASC) 5 MG tablet, Take 5 mg by mouth daily., Disp: , Rfl:    ELIQUIS 5 MG TABS tablet, Take 5 mg by mouth 2 (two) times daily., Disp: , Rfl:    folic acid (FOLVITE) 1 MG tablet, Take 1 tablet (1 mg total) by mouth daily., Disp: 30 tablet, Rfl: 0   metFORMIN (GLUCOPHAGE) 500 MG tablet, Take by mouth 2 (two) times daily with a meal., Disp: , Rfl:    nitroGLYCERIN (NITROSTAT)  0.4 MG SL tablet, Place 1 tablet (0.4 mg total) under the tongue every 5 (five) minutes as needed for chest pain. Reported on 02/25/2016, Disp: 20 tablet, Rfl: 0   predniSONE (DELTASONE) 20 MG tablet, Take 2 tablets (40 mg total) by mouth daily. (Patient not taking: Reported on 03/26/2022), Disp: 10 tablet, Rfl: 0   SYMBICORT 160-4.5 MCG/ACT inhaler, Inhale 2 puffs into the lungs in the morning and at bedtime.  (Patient not taking: Reported on 03/26/2022), Disp: , Rfl:   Orders Placed This Encounter  Procedures   PCV MYOCARDIAL PERFUSION WITH LEXISCAN   EKG 12-Lead   PCV ECHOCARDIOGRAM COMPLETE    There are no Patient Instructions on file for this visit.   --Continue cardiac medications as reconciled in final medication list. --Return in about 6 months (around 09/25/2022) for Follow up post surgery and review test results. . or sooner if needed. --Continue follow-up with your primary care physician regarding the management of your other chronic comorbid conditions.  Patient's questions and concerns were addressed to his satisfaction. He voices understanding of the instructions provided during this encounter.   This note was created using a voice recognition software as a result there may be grammatical errors inadvertently  enclosed that do not reflect the nature of this encounter. Every attempt is made to correct such errors.  Rex Kras, [place], [facility]  Pager: [phone] Office: [phone]

## 2022-04-02 ENCOUNTER — Other Ambulatory Visit: Payer: Medicaid Other

## 2022-04-06 ENCOUNTER — Ambulatory Visit: Payer: Medicaid Other

## 2022-04-06 DIAGNOSIS — I77810 Thoracic aortic ectasia: Secondary | ICD-10-CM

## 2022-04-06 DIAGNOSIS — E119 Type 2 diabetes mellitus without complications: Secondary | ICD-10-CM

## 2022-04-06 DIAGNOSIS — Z01810 Encounter for preprocedural cardiovascular examination: Secondary | ICD-10-CM

## 2022-04-06 DIAGNOSIS — R9431 Abnormal electrocardiogram [ECG] [EKG]: Secondary | ICD-10-CM

## 2022-04-06 DIAGNOSIS — R0602 Shortness of breath: Secondary | ICD-10-CM

## 2022-04-07 ENCOUNTER — Emergency Department (HOSPITAL_COMMUNITY)
Admission: EM | Admit: 2022-04-07 | Discharge: 2022-04-07 | Discharge status: Against Medical Advice | Payer: Medicaid Other | Attending: Emergency Medicine | Admitting: Emergency Medicine

## 2022-04-07 ENCOUNTER — Ambulatory Visit (HOSPITAL_COMMUNITY)
Admission: EM | Admit: 2022-04-07 | Discharge: 2022-04-07 | Disposition: A | Discharge status: Home / Self Care | Payer: Medicaid Other | Attending: Physician Assistant | Admitting: Physician Assistant

## 2022-04-07 ENCOUNTER — Encounter (HOSPITAL_COMMUNITY): Payer: Self-pay

## 2022-04-07 ENCOUNTER — Encounter (HOSPITAL_COMMUNITY): Payer: Self-pay | Admitting: Emergency Medicine

## 2022-04-07 ENCOUNTER — Other Ambulatory Visit: Payer: Self-pay

## 2022-04-07 DIAGNOSIS — S60941A Unspecified superficial injury of left index finger, initial encounter: Secondary | ICD-10-CM | POA: Diagnosis present

## 2022-04-07 DIAGNOSIS — W312XXA Contact with powered woodworking and forming machines, initial encounter: Secondary | ICD-10-CM | POA: Diagnosis not present

## 2022-04-07 DIAGNOSIS — S61211A Laceration without foreign body of left index finger without damage to nail, initial encounter: Secondary | ICD-10-CM | POA: Insufficient documentation

## 2022-04-07 DIAGNOSIS — Z5321 Procedure and treatment not carried out due to patient leaving prior to being seen by health care provider: Secondary | ICD-10-CM | POA: Insufficient documentation

## 2022-04-07 DIAGNOSIS — Z23 Encounter for immunization: Secondary | ICD-10-CM

## 2022-04-07 MED ORDER — ACETAMINOPHEN 325 MG PO TABS
975.0000 mg | ORAL_TABLET | Freq: Once | ORAL | Status: AC
  Administered 2022-04-07: 975 mg via ORAL

## 2022-04-07 MED ORDER — ACETAMINOPHEN 325 MG PO TABS
ORAL_TABLET | ORAL | Status: AC
  Filled 2022-04-07: qty 3

## 2022-04-07 MED ORDER — TETANUS-DIPHTH-ACELL PERTUSSIS 5-2.5-18.5 LF-MCG/0.5 IM SUSY
0.5000 mL | PREFILLED_SYRINGE | Freq: Once | INTRAMUSCULAR | Status: AC
  Administered 2022-04-07: 0.5 mL via INTRAMUSCULAR

## 2022-04-07 MED ORDER — TETANUS-DIPHTH-ACELL PERTUSSIS 5-2.5-18.5 LF-MCG/0.5 IM SUSY
PREFILLED_SYRINGE | INTRAMUSCULAR | Status: AC
  Filled 2022-04-07: qty 0.5

## 2022-04-07 NOTE — ED Triage Notes (Signed)
Patient arrives POV from home w/ a lac to left pointer finger after cutting a piece of wood with a saw. Bleeding controlled at this time. Patient not UTD on tetanus.

## 2022-04-07 NOTE — ED Notes (Signed)
Pt's dtr decided that she would rather take him to urgent care. She called and feels like he could get seen quicker there.

## 2022-04-07 NOTE — ED Triage Notes (Signed)
Pt states cut the tip of lt index finger with a saw. States is on blood thinners. Bandage noted with no active bleeding at this time. Unknown TDAP shot.

## 2022-04-07 NOTE — Discharge Instructions (Signed)
Your tetanus was updated today.  As we discussed, this will take a while to heal.  Keep it clean and dressed to prevent infection.  Follow-up with your primary care next week.  If you have any worsening symptoms you should call hand specialist as we discussed to schedule an appointment.  Use Tylenol for pain.  If you develop any signs of infection including bleeding, drainage, swelling, increased pain, fever, nausea, vomiting you need to be seen immediately.

## 2022-04-07 NOTE — ED Provider Notes (Signed)
[facility]    CSN: [id number] Arrival date & time: 04/07/22  1756      History   Chief Complaint Chief Complaint  Patient presents with   Laceration    HPI Brent Byrd. is a 66 y.o. male.   Patient presents today with a several hour history of laceration to his left index finger.  Reports that he was using a saw when he caught his finger.  He is on blood thinners but was able to stop bleeding with direct pressure.  He is right-handed.  He does report some numbness/tingling at the distal portion of his left finger.  He reports normal movement.  He is unsure when he is last tetanus was updated.  He is open to updating this today.  He has not taken any medication for pain relief.  Reports pain is rated 10 on a 0-10 pain scale, described as throbbing, no aggravating relieving factors identified.    Past Medical History:  Diagnosis Date   Alcohol abuse    Arthritis    Left knee, lower back   Chest pain    hx   COPD (chronic obstructive pulmonary disease) ([place])    "[place] said I don't have this" (10/26/2016)   Depression    no meds   DVT (deep venous thrombosis) ([place]) 2008   Right leg- txed with coumadin    DVT (deep venous thrombosis) ([place]) 2010; 2017   RLE; RLE   Gastroesophageal reflux disease 2014   Hypertension     per patiente-controlled, was taken off BP med   MI, old ~ 2007   Stroke ([place]) 01/2012   denies residual on 10/26/2016   Substance abuse ([place])    Alcohol dependence Hx   Tobacco abuse     Patient Active Problem List   Diagnosis Date Noted   Headache syndrome 07/24/2020   PNA (pneumonia) 01/04/2020   COVID-19 01/04/2020   Community acquired pneumonia 10/25/2016   Acute respiratory failure with hypoxia ([place]) 10/25/2016   Rhinitis, non-allergic 10/25/2016   SOB (shortness of breath) 04/13/2016   Hypotension 04/13/2016   Acute respiratory distress 04/13/2016   Substance abuse (HCC)    Chest pain    COPD (chronic obstructive pulmonary  disease) ([place])    Gastroesophageal reflux disease    Hypertension    Depression    Tobacco abuse    Alcohol abuse    Cigarette smoker 05/18/2015   Asthmatic bronchitis with acute exacerbation 05/17/2015    Past Surgical History:  Procedure Laterality Date   BACK SURGERY     GANGLION CYST EXCISION Left    HAMMER TOE SURGERY Left 07/2012   2nd toe/08/05/2012   LUMBAR DISC SURGERY  06/2007   Herniated disc/notes 02/25/2011   WISDOM TOOTH EXTRACTION         Home Medications    Prior to Admission medications   Medication Sig Start Date End Date Taking? Authorizing Provider  albuterol (VENTOLIN HFA) 108 (90 Base) MCG/ACT inhaler Inhale 1-2 puffs into the lungs. 02/04/22 02/04/23  [provider]  amLODipine (NORVASC) 5 MG tablet Take 5 mg by mouth daily. 02/17/22   [provider]  ELIQUIS 5 MG TABS tablet Take 5 mg by mouth 2 (two) times daily. 12/24/21   [provider]  folic acid (FOLVITE) 1 MG tablet Take 1 tablet (1 mg total) by mouth daily. 01/08/20   Elgergawy, Silver Huguenin, MD  metFORMIN (GLUCOPHAGE) 500 MG tablet Take by mouth 2 (two) times daily with  a meal.    [provider]  nitroGLYCERIN (NITROSTAT) 0.4 MG SL tablet Place 1 tablet (0.4 mg total) under the tongue every 5 (five) minutes as needed for chest pain. Reported on 02/25/2016 01/08/20   Elgergawy, Silver Huguenin, MD  SYMBICORT 160-4.5 MCG/ACT inhaler Inhale 2 puffs into the lungs in the morning and at bedtime.  Patient not taking: Reported on 03/26/2022 06/10/19   [provider]  famotidine (PEPCID) 20 MG tablet Take 1 tablet (20 mg total) by mouth 2 (two) times daily for 10 days. 01/08/20 08/22/20  Elgergawy, Silver Huguenin, MD    Family History Family History  Problem Relation Age of Onset   Asthma Mother    Cancer Father        Lung   Cancer Sister    Thrombosis Sister    Diabetes Sister    Colon cancer Neg Hx    Rectal cancer Neg Hx    Stomach cancer Neg Hx    Esophageal cancer Neg  Hx     Social History Social History   Tobacco Use   Smoking status: Every Day    Packs/day: 0.50    Years: 45.00    Total pack years: 22.50    Types: Cigarettes   Smokeless tobacco: Never  Vaping Use   Vaping Use: Never used  Substance Use Topics   Alcohol use: Not Currently    Alcohol/week: 1.0 standard drink of alcohol    Types: 1 Cans of beer per week    Comment: quit drinking a year ago   Drug use: Not Currently    Types: "Crack" cocaine, Cocaine    Comment: 10/26/2016 "crack once q 2-3 months"     Allergies   Iodine and Omnipaque [iohexol]   Review of Systems Review of Systems  Constitutional:  Positive for activity change. Negative for appetite change, fatigue and fever.  Skin:  Positive for wound.  Neurological:  Positive for numbness. Negative for weakness.     Physical Exam Triage Vital Signs ED Triage Vitals  Enc Vitals Group     BP 04/07/22 1803 127/83     Pulse Rate 04/07/22 1803 87     Resp 04/07/22 1803 18     Temp 04/07/22 1803 98.6 F (37 C)     Temp Source 04/07/22 1803 Oral     SpO2 04/07/22 1803 94 %     Weight --      Height --      Head Circumference --      Peak Flow --      Pain Score 04/07/22 1804 10     Pain Loc --      Pain Edu? --      Excl. in [place]? --    No data found.  Updated Vital Signs BP 127/83 (BP Location: Right Arm)   Pulse 87   Temp 98.6 F (37 C) (Oral)   Resp 18   SpO2 94%   Visual Acuity Right Eye Distance:   Left Eye Distance:   Bilateral Distance:    Right Eye Near:   Left Eye Near:    Bilateral Near:     Physical Exam Vitals reviewed.  Constitutional:      General: He is awake.     Appearance: Normal appearance. He is well-developed. He is not ill-appearing.     Comments: Very pleasant male appears stated age in no acute distress sitting comfortably in exam room  HENT:     Head: Normocephalic and  atraumatic.     Mouth/Throat:     Pharynx: No oropharyngeal exudate, posterior oropharyngeal  erythema or uvula swelling.  Cardiovascular:     Pulses:          Radial pulses are 2+ on the right side and 2+ on the left side.     Comments: Capillary fill within 2 seconds left index finger. Pulmonary:     Effort: Pulmonary effort is normal. No tachypnea or respiratory distress.  Skin:    Findings: Laceration present.     Comments: 5 cm laceration left index finger pad with irregular and jagged wound edges.  Neurological:     Mental Status: He is alert.     Comments: Decreased sharp dull sensation and two-point discrimination distal to laceration left index finger.  Psychiatric:        Behavior: Behavior is cooperative.      UC Treatments / Results  Labs (all labs ordered are listed, but only abnormal results are displayed) Labs Reviewed - No data to display  EKG   Radiology PCV ECHOCARDIOGRAM COMPLETE  Addendum Date: 04/07/2022   Echocardiogram 04/06/2022: Left ventricle cavity is normal in size. Mild concentric hypertrophy of the left ventricle. Normal global wall motion. Normal LV systolic function with EF 58%. Doppler evidence of grade I (impaired) diastolic dysfunction, normal LAP. The aortic root is dilated, maximal dimension 4.4 cm at sinotubular junction. Structurally normal trileaflet aortic valve.  Mild (Grade I) aortic regurgitation. Mild to moderate mitral regurgitation. Normal right atrial pressure.   Result Date: 04/07/2022 Echocardiogram 04/06/2022: Left ventricle cavity is normal in size. Mild concentric hypertrophy of the left ventricle. Normal global wall motion. Normal LV systolic function with EF 58%. Doppler evidence of grade I (impaired) diastolic dysfunction, normal LAP. The aortic root is dilated, maximal dimension 4.4 cm at sinotubular junction. Structurally normal trileaflet aortic valve.  Mild (Grade I) aortic regurgitation. Mild to moderate mitral regurgitation. Normal right atrial pressure.    Procedures Procedures (including critical care  time)  Medications Ordered in UC Medications  Tdap (BOOSTRIX) injection 0.5 mL (has no administration in time range)  acetaminophen (TYLENOL) tablet 975 mg (975 mg Oral Given 04/07/22 1924)    Initial Impression / Assessment and Plan / UC Course  I have reviewed the triage vital signs and the nursing notes.  Pertinent labs & imaging results that were available during my care of the patient were reviewed by me and considered in my medical decision making (see chart for details).     Wound does not reapproximate well.  We did discuss potential utility of loose closure, however, patient preferred for secondary healing.  Wound was seen with Dr. Mannie Stabile who agreed with treatment plan.  Hand is vascularly intact with decree sensation distally likely related to disruption of fine sensory nerves.  Area was cleaned in clinic and tetanus was updated.  Patient was encouraged to keep area dressed to prevent infection and encourage healing.  Discussed that if he develops any signs/symptoms of infection including drainage, swelling, redness, pain, fever, nausea, vomiting he needs to be seen immediately.  Recommended follow-up with PCP next week.  Can use over-the-counter medications for pain relief.  Final Clinical Impressions(s) / UC Diagnoses   Final diagnoses:  Laceration of left index finger without foreign body without damage to nail, initial encounter     Discharge Instructions      Your tetanus was updated today.  As we discussed, this will take a while to heal.  Keep it clean  and dressed to prevent infection.  Follow-up with your primary care next week.  If you have any worsening symptoms you should call hand specialist as we discussed to schedule an appointment.  Use Tylenol for pain.  If you develop any signs of infection including bleeding, drainage, swelling, increased pain, fever, nausea, vomiting you need to be seen immediately.     ED Prescriptions   None    PDMP not reviewed this  encounter.   Terrilee Croak, PA-C 04/07/22 1924

## 2022-04-07 NOTE — ED Triage Notes (Signed)
Patient does report taking eliquis.

## 2022-04-13 ENCOUNTER — Ambulatory Visit: Payer: Medicaid Other

## 2022-04-13 DIAGNOSIS — R9431 Abnormal electrocardiogram [ECG] [EKG]: Secondary | ICD-10-CM

## 2022-04-13 DIAGNOSIS — R0602 Shortness of breath: Secondary | ICD-10-CM

## 2022-04-13 DIAGNOSIS — Z01810 Encounter for preprocedural cardiovascular examination: Secondary | ICD-10-CM

## 2022-04-30 ENCOUNTER — Ambulatory Visit: Payer: Medicaid Other | Admitting: Cardiology

## 2022-04-30 NOTE — Progress Notes (Deleted)
ID:  Brent Mans., DOB 22-Oct-1956, MRN [id number]  PCP:  Nolene Ebbs, MD  Cardiologist:  Rex Kras, DO, [facility] (established care 03/26/2022)  Date: 04/30/22 Last Office Visit: 03/26/2022  No chief complaint on file.   HPI  Brent Sassano. is a 65 y.o. African-American male who presents to the clinic for evaluation of preoperative risk stratification at the request of Nolene Ebbs, MD. His past medical history and cardiovascular risk factors include: Hypertension, COPD, hyperlipidemia, smoker, Hx alcohol abuse, history of DVT, history of stroke with left foot drop, non-insulin-dependent diabetes.  Patient referred to the practice for preoperative risk stratification for left knee scope with Dr. Mardelle Matte.  He denies anginal discomfort but does have shortness of breath which is likely due to his history of smoking and COPD; however, since his overall functional capacity was limited and multiple cardiovascular risk factors which are not well optimized shared decision was to proceed with echo and stress test.  His nuclear stress test was reported to be high risk and therefore is requested to come in sooner than the scheduled office visit to discuss the findings and symptoms.  ***  FUNCTIONAL STATUS: Before the left knee pain would walk 2 miles w/ his daughter. Last occurrence atleast 1.5 months ago.    ALLERGIES: Allergies  Allergen Reactions   Iodine Hives and Other (See Comments)   Omnipaque [Iohexol] Other (See Comments)    IV Dye-Sneezing    MEDICATION LIST PRIOR TO VISIT: No outpatient medications have been marked as taking for the 04/30/22 encounter (Appointment) with Terri Skains, Sheika Coutts, DO.     PAST MEDICAL HISTORY: Past Medical History:  Diagnosis Date   Alcohol abuse    Arthritis    Left knee, lower back   Chest pain    hx   COPD (chronic obstructive pulmonary disease) ([place])    "[place] said I don't have this" (10/26/2016)   Depression    no meds   DVT (deep venous  thrombosis) ([place]) 2008   Right leg- txed with coumadin    DVT (deep venous thrombosis) ([place]) 2010; 2017   RLE; RLE   Gastroesophageal reflux disease 2014   Hypertension     per patiente-controlled, was taken off BP med   MI, old ~ 2007   Stroke ([place]) 01/2012   denies residual on 10/26/2016   Substance abuse ([place])    Alcohol dependence Hx   Tobacco abuse     PAST SURGICAL HISTORY: Past Surgical History:  Procedure Laterality Date   BACK SURGERY     GANGLION CYST EXCISION Left    HAMMER TOE SURGERY Left 07/2012   2nd toe/08/05/2012   LUMBAR DISC SURGERY  06/2007   Herniated disc/notes 02/25/2011   WISDOM TOOTH EXTRACTION      FAMILY HISTORY: The patient family history includes Asthma in his mother; Cancer in his father and sister; Diabetes in his sister; Thrombosis in his sister.  SOCIAL HISTORY:  The patient  reports that he has been smoking cigarettes. He has a 22.50 pack-year smoking history. He has never used smokeless tobacco. He reports that he does not currently use alcohol after a past usage of about 1.0 standard drink of alcohol per week. He reports that he does not currently use drugs after having used the following drugs: "Crack" cocaine and Cocaine.  REVIEW OF SYSTEMS: Review of Systems  Cardiovascular:  Negative for chest pain, dyspnea on exertion, leg swelling, palpitations and syncope.  Musculoskeletal:  Positive for arthritis and joint pain.  PHYSICAL EXAM:    04/07/2022    6:03 PM 04/07/2022    5:16 PM 04/07/2022    5:15 PM  Vitals with BMI  Height  6' 4"    Weight  305 lbs   BMI  40.98   Systolic 119  147  Diastolic 83  94  Pulse 87  93    CONSTITUTIONAL: Well-developed and well-nourished. No acute distress.  SKIN: Skin is warm and dry. No rash noted. No cyanosis. No pallor. No jaundice HEAD: Normocephalic and atraumatic.  EYES: No scleral icterus MOUTH/THROAT: Moist oral membranes.  NECK: No JVD present. No thyromegaly noted. No carotid bruits   CHEST Normal respiratory effort. No intercostal retractions  LUNGS: Clear to auscultation bilaterally.  No stridor. No wheezes. No rales.  CARDIOVASCULAR: Regular rate and rhythm, positive S1-S2, no murmurs rubs or gallops appreciated. ABDOMINAL: Obese, soft, nontender, nondistended, positive bowel sounds in all 4 quadrants, no apparent ascites.  EXTREMITIES: Trace bilateral peripheral edema, warm to touch, left knee brace  HEMATOLOGIC: No significant bruising NEUROLOGIC: Oriented to person, place, and time. Nonfocal. Normal muscle tone.  PSYCHIATRIC: Normal mood and affect. Normal behavior. Cooperative  CARDIAC DATABASE: EKG: 03/26/2022: NSR, 61 bpm, consider old anteroseptal infarct, diffuse nonspecific T wave abnormality.   Echocardiogram: 04/06/2022:  Left ventricle cavity is normal in size. Mild concentric hypertrophy of the left ventricle. Normal global wall motion. Normal LV systolic function with EF 58%. Doppler evidence of grade I (impaired) diastolic dysfunction, normal LAP.  The aortic root is dilated, maximal dimension 4.4 cm at sinotubular junction.  Structurally normal trileaflet aortic valve.  Mild (Grade I) aortic regurgitation.  Mild to moderate mitral regurgitation.  Normal right atrial pressure.  Stress Testing: Lexiscan Nuclear stress test 04/13/2022: Nondiagnostic ECG stress. The heart rate response was consistent with Regadenoson.  Myocardial perfusion is abnormal.  Mild degree large extent perfusion defect consistent with moderate ischemia located in the apical anterior wall, apical lateral wall, mid inferolateral wall and basal inferolateral wall (Left Anterior Descending Artery and Left Circumflex Artery regions) of left ventricle. LV is dilated in both rest and stress, more dilated in stress although calculated TID index is normal. Overall LV systolic function is abnormal with inferior and inferolateral akinesis and global hypokinesis.   Stress LVEF mildly reduced  at LV EF: 42%.  No previous exam available for comparison. High risk and multivessel disease cannot be excluded.  Heart Catheterization: None  LABORATORY DATA: External Labs: Collected: December 24, 2021 available in Care Everywhere, [facility] health Hemoglobin A1c 6.5 Sodium 137, potassium 4.4, chloride 105, bicarb 27.5. BUN 11, creatinine 0.67. eGFR >60. AST 20, ALT 17, alkaline phosphatase 60 Total cholesterol 175, triglycerides 69, HDL 66, LDL 95, non-HDL 109  IMPRESSION:  No diagnosis found.    RECOMMENDATIONS: Brent Byrd. is a 66 y.o. African-American male whose past medical history and cardiac risk factors include: Hypertension, COPD, hyperlipidemia, smoker, Hx alcohol abuse, history of DVT, history of stroke with left foot drop, non-insulin-dependent diabetes.  Abnormal nuclear stress test MPI performed as part of preoperative risk ratification as he is experiencing shortness of breath at rest and also with effort related activities.  And reduced functional capacity at baseline.  Dyspnea likely secondary to ongoing cigarette smoking versus COPD but ischemic substrate cannot be excluded. Recent MPI notes high risk findings such as reversible ischemia related to coronary distributions, mildly reduced LVEF, regional wall motion abnormalities on SPECT images, and visual transient ischemic dilatation.  Discussed risks, benefits, alternatives and limitations of  both coronary CTA versus invasive angiography.  Patient would like to proceed with left heart catheterization with possible intervention.   ***  Preop cardiovascular exam Being considered for left knee scope with Dr. Mardelle Matte.  Date to be determined. Given multiple cardiovascular risk factors and unknown functional capacity he underwent ischemic work-up with echo and stress test.  Results reviewed with the patient and noted above for further reference. Further recommendations to follow after left heart  catheterization.  Shortness of breath Multifactorial. Recommend pulmonary evaluation prior to his upcoming noncardiac surgery given his history of active cigarette smoking and COPD. Ischemic work-up as outlined above  Aortic root dilatation ([place]) Noted on routine echocardiogram June 2023.  We will recheck echocardiogram in 1 year to reevaluate disease progression. ***  Non-insulin dependent type 2 diabetes mellitus (HCC) Last hemoglobin A1c 6.5. Currently managed by primary care provider.  Mixed hyperlipidemia Recommend statin therapy given his history of non-insulin-dependent diabetes. Currently managed by primary care provider.  Benign hypertension ***  Cigarette smoker ***  History of deep venous thrombosis Currently on Eliquis, anticoagulation managed by PCP (per patient)ds  Chronic obstructive pulmonary disease, unspecified COPD type ([place]) We encouraged the importance of smoking cessation. Patient is encouraged to follow-up with pulmonary medicine -May benefit from inhalers We will defer the need for pulmonary evaluation prior to surgery to primary team.   FINAL MEDICATION LIST END OF ENCOUNTER: No orders of the defined types were placed in this encounter.   There are no discontinued medications.    Current Outpatient Medications:    albuterol (VENTOLIN HFA) 108 (90 Base) MCG/ACT inhaler, Inhale 1-2 puffs into the lungs., Disp: , Rfl:    amLODipine (NORVASC) 5 MG tablet, Take 5 mg by mouth daily., Disp: , Rfl:    ELIQUIS 5 MG TABS tablet, Take 5 mg by mouth 2 (two) times daily., Disp: , Rfl:    folic acid (FOLVITE) 1 MG tablet, Take 1 tablet (1 mg total) by mouth daily., Disp: 30 tablet, Rfl: 0   metFORMIN (GLUCOPHAGE) 500 MG tablet, Take by mouth 2 (two) times daily with a meal., Disp: , Rfl:    nitroGLYCERIN (NITROSTAT) 0.4 MG SL tablet, Place 1 tablet (0.4 mg total) under the tongue every 5 (five) minutes as needed for chest pain. Reported on 02/25/2016, Disp: 20  tablet, Rfl: 0   SYMBICORT 160-4.5 MCG/ACT inhaler, Inhale 2 puffs into the lungs in the morning and at bedtime.  (Patient not taking: Reported on 03/26/2022), Disp: , Rfl:   No orders of the defined types were placed in this encounter.   There are no Patient Instructions on file for this visit.   --Continue cardiac medications as reconciled in final medication list. --No follow-ups on file. or sooner if needed. --Continue follow-up with your primary care physician regarding the management of your other chronic comorbid conditions.  Patient's questions and concerns were addressed to his satisfaction. He voices understanding of the instructions provided during this encounter.   This note was created using a voice recognition software as a result there may be grammatical errors inadvertently enclosed that do not reflect the nature of this encounter. Every attempt is made to correct such errors.  Rex Kras, [place], [facility]  Pager: [phone] Office: [phone]

## 2022-05-08 ENCOUNTER — Encounter: Payer: Self-pay | Admitting: Cardiology

## 2022-05-08 ENCOUNTER — Ambulatory Visit: Payer: Medicaid Other | Admitting: Cardiology

## 2022-05-08 VITALS — BP 128/90 | HR 60 | Temp 97.4°F | Resp 16 | Ht 76.0 in | Wt 306.0 lb

## 2022-05-08 DIAGNOSIS — Z86718 Personal history of other venous thrombosis and embolism: Secondary | ICD-10-CM

## 2022-05-08 DIAGNOSIS — R0602 Shortness of breath: Secondary | ICD-10-CM

## 2022-05-08 DIAGNOSIS — I10 Essential (primary) hypertension: Secondary | ICD-10-CM

## 2022-05-08 DIAGNOSIS — J449 Chronic obstructive pulmonary disease, unspecified: Secondary | ICD-10-CM

## 2022-05-08 DIAGNOSIS — F17210 Nicotine dependence, cigarettes, uncomplicated: Secondary | ICD-10-CM

## 2022-05-08 DIAGNOSIS — Z91041 Radiographic dye allergy status: Secondary | ICD-10-CM

## 2022-05-08 DIAGNOSIS — E782 Mixed hyperlipidemia: Secondary | ICD-10-CM

## 2022-05-08 DIAGNOSIS — R931 Abnormal findings on diagnostic imaging of heart and coronary circulation: Secondary | ICD-10-CM

## 2022-05-08 DIAGNOSIS — I77810 Thoracic aortic ectasia: Secondary | ICD-10-CM

## 2022-05-08 DIAGNOSIS — Z01810 Encounter for preprocedural cardiovascular examination: Secondary | ICD-10-CM

## 2022-05-08 DIAGNOSIS — E119 Type 2 diabetes mellitus without complications: Secondary | ICD-10-CM

## 2022-05-08 MED ORDER — PREDNISONE 50 MG PO TABS: ORAL_TABLET | ORAL | 0 refills | Status: DC

## 2022-05-08 MED ORDER — DIPHENHYDRAMINE HCL 50 MG PO TABS: 50.0000 mg | ORAL_TABLET | Freq: Once | ORAL | 0 refills | Status: DC

## 2022-05-08 MED ORDER — HYDROCHLOROTHIAZIDE 12.5 MG PO CAPS: 12.5000 mg | ORAL_CAPSULE | Freq: Every day | ORAL | 0 refills | Status: DC

## 2022-05-08 MED ORDER — ROSUVASTATIN CALCIUM 20 MG PO TABS: 20.0000 mg | ORAL_TABLET | Freq: Every day | ORAL | 0 refills | Status: DC

## 2022-05-08 MED ORDER — ASPIRIN 81 MG PO TBEC: 81.0000 mg | DELAYED_RELEASE_TABLET | Freq: Every day | ORAL | 12 refills | Status: DC

## 2022-05-08 NOTE — H&P (View-Only) (Signed)
ID:  Brent Mans., DOB August 31, 1956, MRN [id number]  PCP:  Nolene Ebbs, MD  Cardiologist:  Rex Kras, DO, [facility] (established care 03/26/2022)  Date: 05/08/22 Last Office Visit: 03/26/2022  Chief Complaint  Patient presents with   Results   Follow-up   HPI  Brent Caspers. is a 66 y.o. African-American male who presents to the clinic for evaluation of preoperative risk stratification at the request of Nolene Ebbs, MD. His past medical history and cardiovascular risk factors include: Hypertension, COPD, hyperlipidemia, smoker, Hx alcohol abuse, history of DVT, history of stroke with left foot drop, non-insulin-dependent diabetes.  Patient referred to the practice for preoperative risk stratification for left knee scope with Dr. Mardelle Matte.  He denies anginal discomfort but does have shortness of breath which is likely due to his history of smoking and COPD; however, since his overall functional capacity was limited and multiple cardiovascular risk factors which are not well optimized shared decision was to proceed with echo and stress test.  His nuclear stress test was reported to be high risk and therefore is requested to come in sooner than the scheduled office visit to discuss the findings and symptoms.  Clinically denies anginal discomfort but continues to have effort related dyspnea with minimal day-to-day activity.  FUNCTIONAL STATUS: Before the left knee pain would walk 2 miles w/ his daughter. Last occurrence atleast 1.5 months ago.    ALLERGIES: Allergies  Allergen Reactions   Iodine Hives and Other (See Comments)   Omnipaque [Iohexol] Other (See Comments)    IV Dye-Sneezing    MEDICATION LIST PRIOR TO VISIT: Current Meds  Medication Sig   albuterol (VENTOLIN HFA) 108 (90 Base) MCG/ACT inhaler Inhale 1-2 puffs into the lungs.   amLODipine (NORVASC) 5 MG tablet Take 5 mg by mouth daily.   aspirin EC 81 MG tablet Take 1 tablet (81 mg total) by mouth daily. Swallow  whole.   ELIQUIS 5 MG TABS tablet Take 5 mg by mouth 2 (two) times daily.   folic acid (FOLVITE) 1 MG tablet Take 1 tablet (1 mg total) by mouth daily.   hydrochlorothiazide (MICROZIDE) 12.5 MG capsule Take 1 capsule (12.5 mg total) by mouth daily.   metFORMIN (GLUCOPHAGE) 500 MG tablet Take by mouth 2 (two) times daily with a meal.   nitroGLYCERIN (NITROSTAT) 0.4 MG SL tablet Place 1 tablet (0.4 mg total) under the tongue every 5 (five) minutes as needed for chest pain. Reported on 02/25/2016   rosuvastatin (CRESTOR) 20 MG tablet Take 1 tablet (20 mg total) by mouth at bedtime.   SYMBICORT 160-4.5 MCG/ACT inhaler Inhale 2 puffs into the lungs in the morning and at bedtime.   [DISCONTINUED] diphenhydrAMINE (BENADRYL) 50 MG tablet Take 1 tablet (50 mg total) by mouth once for 1 dose. Take one tablet one hour prior to your heart catheterization.   [DISCONTINUED] predniSONE (DELTASONE) 50 MG tablet Take 1 tablet 13 hours prior to your heart catheterization. Take 1 tablet 7 hours prior to heart catheterization. Take 1 tablet 1 hour prior to your heart catheterization.     PAST MEDICAL HISTORY: Past Medical History:  Diagnosis Date   Alcohol abuse    Arthritis    Left knee, lower back   Chest pain    hx   COPD (chronic obstructive pulmonary disease) ([place])    "[place] said I don't have this" (10/26/2016)   Depression    no meds   DVT (deep venous thrombosis) ([place]) 2008   Right leg-  txed with coumadin    DVT (deep venous thrombosis) ([place]) 2010; 2017   RLE; RLE   Gastroesophageal reflux disease 2014   Hypertension     per patiente-controlled, was taken off BP med   MI, old ~ 2007   Stroke ([place]) 01/2012   denies residual on 10/26/2016   Substance abuse ([place])    Alcohol dependence Hx   Tobacco abuse     PAST SURGICAL HISTORY: Past Surgical History:  Procedure Laterality Date   BACK SURGERY     GANGLION CYST EXCISION Left    HAMMER TOE SURGERY Left 07/2012   2nd toe/08/05/2012    LUMBAR DISC SURGERY  06/2007   Herniated disc/notes 02/25/2011   WISDOM TOOTH EXTRACTION      FAMILY HISTORY: The patient family history includes Asthma in his mother; Cancer in his father and sister; Diabetes in his sister; Thrombosis in his sister.  SOCIAL HISTORY:  The patient  reports that he has been smoking cigarettes. He has a 22.50 pack-year smoking history. He has never used smokeless tobacco. He reports that he does not currently use alcohol after a past usage of about 1.0 standard drink of alcohol per week. He reports that he does not currently use drugs after having used the following drugs: "Crack" cocaine and Cocaine.  REVIEW OF SYSTEMS: Review of Systems  Cardiovascular:  Positive for dyspnea on exertion. Negative for chest pain, claudication, irregular heartbeat, leg swelling, near-syncope, orthopnea, palpitations, paroxysmal nocturnal dyspnea and syncope.  Respiratory:  Positive for shortness of breath.   Hematologic/Lymphatic: Negative for bleeding problem.  Musculoskeletal:  Positive for arthritis and joint pain. Negative for muscle cramps and myalgias.  Neurological:  Negative for dizziness and light-headedness.    PHYSICAL EXAM:    05/08/2022   10:49 AM 04/07/2022    6:03 PM 04/07/2022    5:16 PM  Vitals with BMI  Height $Remov'6\' 4"'fKzBxV$   '6\' 4"'$   Weight 306 lbs  305 lbs  BMI 16.60  63.01  Systolic 601 093   Diastolic 90 83   Pulse 60 87    CONSTITUTIONAL: Well-developed and well-nourished. No acute distress.  SKIN: Skin is warm and dry. No rash noted. No cyanosis. No pallor. No jaundice HEAD: Normocephalic and atraumatic.  EYES: No scleral icterus MOUTH/THROAT: Moist oral membranes.  NECK: No JVD present. No thyromegaly noted. No carotid bruits  CHEST Normal respiratory effort. No intercostal retractions  LUNGS: Clear to auscultation bilaterally.  No stridor. No wheezes. No rales.  CARDIOVASCULAR: Regular rate and rhythm, positive S1-S2, no murmurs rubs or gallops  appreciated. ABDOMINAL: Obese, soft, nontender, nondistended, positive bowel sounds in all 4 quadrants, no apparent ascites.  EXTREMITIES: Trace bilateral peripheral edema, warm to touch, left knee brace  HEMATOLOGIC: No significant bruising NEUROLOGIC: Oriented to person, place, and time. Nonfocal. Normal muscle tone.  PSYCHIATRIC: Normal mood and affect. Normal behavior. Cooperative No change in physical examination since last office visit  CARDIAC DATABASE: EKG: 03/26/2022: NSR, 61 bpm, consider old anteroseptal infarct, diffuse nonspecific T wave abnormality.   Echocardiogram: 04/06/2022:  Left ventricle cavity is normal in size. Mild concentric hypertrophy of the left ventricle. Normal global wall motion. Normal LV systolic function with EF 58%. Doppler evidence of grade I (impaired) diastolic dysfunction, normal LAP.  The aortic root is dilated, maximal dimension 4.4 cm at sinotubular junction.  Structurally normal trileaflet aortic valve.  Mild (Grade I) aortic regurgitation.  Mild to moderate mitral regurgitation.  Normal right atrial pressure.  Stress Testing: Occupational psychologist  stress test 04/13/2022: Nondiagnostic ECG stress. The heart rate response was consistent with Regadenoson.  Myocardial perfusion is abnormal.  Mild degree large extent perfusion defect consistent with moderate ischemia located in the apical anterior wall, apical lateral wall, mid inferolateral wall and basal inferolateral wall (Left Anterior Descending Artery and Left Circumflex Artery regions) of left ventricle. LV is dilated in both rest and stress, more dilated in stress although calculated TID index is normal. Overall LV systolic function is abnormal with inferior and inferolateral akinesis and global hypokinesis.   Stress LVEF mildly reduced at LV EF: 42%.  No previous exam available for comparison. High risk and multivessel disease cannot be excluded.  Heart Catheterization: None  LABORATORY  DATA: External Labs: Collected: December 24, 2021 available in Care Everywhere, [facility] health Hemoglobin A1c 6.5 Sodium 137, potassium 4.4, chloride 105, bicarb 27.5. BUN 11, creatinine 0.67. eGFR >60. AST 20, ALT 17, alkaline phosphatase 60 Total cholesterol 175, triglycerides 69, HDL 66, LDL 95, non-HDL 109  IMPRESSION:    ICD-10-CM   1. Abnormal nuclear cardiac imaging test  R93.1 CBC    Basic metabolic panel    aspirin EC 81 MG tablet    rosuvastatin (CRESTOR) 20 MG tablet    2. Shortness of breath  R06.02 hydrochlorothiazide (MICROZIDE) 12.5 MG capsule    B Nat Peptide    3. Preop cardiovascular exam  Z01.810     4. Aortic root dilatation (HCC)  I77.810 PCV ECHOCARDIOGRAM COMPLETE    5. Benign hypertension  I10 hydrochlorothiazide (MICROZIDE) 12.5 MG capsule    6. Non-insulin dependent type 2 diabetes mellitus (HCC)  E11.9 rosuvastatin (CRESTOR) 20 MG tablet    7. Mixed hyperlipidemia  E78.2     8. Cigarette smoker  F17.210     9. History of deep venous thrombosis  Z86.718     10. Chronic obstructive pulmonary disease, unspecified COPD type ([place])  J44.9     11. Contrast media allergy  Z91.041 predniSONE (DELTASONE) 50 MG tablet    diphenhydrAMINE (BENADRYL) 50 MG tablet    DISCONTINUED: predniSONE (DELTASONE) 50 MG tablet    DISCONTINUED: diphenhydrAMINE (BENADRYL) 50 MG tablet        RECOMMENDATIONS: Brent Grosso. is a 66 y.o. African-American male whose past medical history and cardiac risk factors include: Hypertension, COPD, hyperlipidemia, smoker, Hx alcohol abuse, history of DVT, history of stroke with left foot drop, non-insulin-dependent diabetes.  Abnormal nuclear stress test MPI performed as part of preoperative risk stratification as he is experiencing dyspnea at rest and also with effort related activities.  And reduced functional capacity at baseline.    Recent MPI notes high risk findings such as reversible ischemia related to coronary distributions,  mildly reduced LVEF, regional wall motion abnormalities on SPECT images, and visual transient ischemic dilatation.  Discussed risks, benefits, alternatives and limitations of both coronary CTA versus invasive angiography.  Patient would like to proceed with left heart catheterization with possible intervention.   The procedure of left heart catheterization with possible intervention was explained to the patient in detail.  The indication, alternatives, risks and benefits were reviewed.  Complications include but not limited to bleeding, infection, vascular injury, stroke, myocardial infarction, arrhythmia (requiring medical or cardiopulmonary resuscitation), kidney injury (requiring short-term or long-term hemodialysis), radiation-related injury in the case of prolonged fluoroscopy use, emergent cardiac surgery, temporary or permanent pacemaker, and death. The patient understands the risks of serious complication is 1-2 in 3557 with diagnostic cardiac cath and 1-2% or less with angioplasty/stenting.  Patient voices understanding and provides verbal feedback his questions and concerns are addressed to his satisfaction and patient wishes to proceed with coronary angiography with possible PCI.  Start aspirin 81 mg p.o. daily & Crestor 20 mg p.o. nightly.   Check CBC, BMP, and BNP prior to his heart catheterization.  Patient does have contrast allergy: Prednisone 50 mg p.o. x1 17 hours prior to his procedure.  50 mg x 1 7 hours prior to his procedure.  50 mg x 1 one hour prior to the procedure.  Benadryl 50 mg p.o. x 1 hour prior to the procedure.   Preop cardiovascular exam Being considered for left knee scope with Dr. Mardelle Matte.  Date to be determined. Given multiple cardiovascular risk factors and unknown functional capacity he underwent ischemic work-up with echo and stress test.  Results reviewed with the patient and noted above for further reference. Further recommendations to follow after left  heart catheterization.  Shortness of breath Multifactorial. Recommend pulmonary evaluation prior to his upcoming noncardiac surgery given his history of active cigarette smoking and COPD. Ischemic work-up as outlined above  Aortic root dilatation ([place]) Noted on routine echocardiogram June 2023.  We will recheck echocardiogram in 1 year to reevaluate disease progression -order placed.  Non-insulin dependent type 2 diabetes mellitus (HCC) Last hemoglobin A1c 6.5. Currently managed by primary care provider.  Mixed hyperlipidemia Recommend statin therapy given his history of non-insulin-dependent diabetes. Currently managed by primary care provider.  Benign hypertension Office blood pressures are well controlled. Medications reconciled. Currently managed by primary care provider.  Cigarette smoker Tobacco cessation counseling: Currently smoking 1 packs/day   Patient is not willing to quit at this time. 5 mins were spent counseling patient cessation techniques. We discussed various methods to help quit smoking, including deciding on a date to quit, joining a support group, pharmacological agents- nicotine gum/patch/lozenges.  History of deep venous thrombosis Currently on Eliquis, anticoagulation managed by PCP (per patient)ds  Chronic obstructive pulmonary disease, unspecified COPD type ([place]) We encouraged the importance of smoking cessation. Patient is encouraged to follow-up with pulmonary medicine -May benefit from inhalers We will defer the need for pulmonary evaluation prior to surgery to primary team.   FINAL MEDICATION LIST END OF ENCOUNTER: Meds ordered this encounter  Medications   aspirin EC 81 MG tablet    Sig: Take 1 tablet (81 mg total) by mouth daily. Swallow whole.    Dispense:  30 tablet    Refill:  12   rosuvastatin (CRESTOR) 20 MG tablet    Sig: Take 1 tablet (20 mg total) by mouth at bedtime.    Dispense:  90 tablet    Refill:  0   hydrochlorothiazide  (MICROZIDE) 12.5 MG capsule    Sig: Take 1 capsule (12.5 mg total) by mouth daily.    Dispense:  90 capsule    Refill:  0   DISCONTD: predniSONE (DELTASONE) 50 MG tablet    Sig: Take 1 tablet 13 hours prior to your heart catheterization. Take 1 tablet 7 hours prior to heart catheterization. Take 1 tablet 1 hour prior to your heart catheterization.    Dispense:  3 tablet    Refill:  0   DISCONTD: diphenhydrAMINE (BENADRYL) 50 MG tablet    Sig: Take 1 tablet (50 mg total) by mouth once for 1 dose. Take one tablet one hour prior to your heart catheterization.    Dispense:  1 tablet    Refill:  0   predniSONE (DELTASONE) 50 MG  tablet    Sig: Take 1 tablet 13 hours prior to your heart catheterization. Take 1 tablet 7 hours prior to heart catheterization. Take 1 tablet 1 hour prior to your heart catheterization.    Dispense:  3 tablet    Refill:  0   diphenhydrAMINE (BENADRYL) 50 MG tablet    Sig: Take 1 tablet (50 mg total) by mouth once for 1 dose. Take one tablet one hour prior to your heart catheterization.    Dispense:  1 tablet    Refill:  0    Medications Discontinued During This Encounter  Medication Reason   predniSONE (DELTASONE) 50 MG tablet    diphenhydrAMINE (BENADRYL) 50 MG tablet       Current Outpatient Medications:    albuterol (VENTOLIN HFA) 108 (90 Base) MCG/ACT inhaler, Inhale 1-2 puffs into the lungs., Disp: , Rfl:    amLODipine (NORVASC) 5 MG tablet, Take 5 mg by mouth daily., Disp: , Rfl:    aspirin EC 81 MG tablet, Take 1 tablet (81 mg total) by mouth daily. Swallow whole., Disp: 30 tablet, Rfl: 12   ELIQUIS 5 MG TABS tablet, Take 5 mg by mouth 2 (two) times daily., Disp: , Rfl:    folic acid (FOLVITE) 1 MG tablet, Take 1 tablet (1 mg total) by mouth daily., Disp: 30 tablet, Rfl: 0   hydrochlorothiazide (MICROZIDE) 12.5 MG capsule, Take 1 capsule (12.5 mg total) by mouth daily., Disp: 90 capsule, Rfl: 0   metFORMIN (GLUCOPHAGE) 500 MG tablet, Take by mouth 2  (two) times daily with a meal., Disp: , Rfl:    nitroGLYCERIN (NITROSTAT) 0.4 MG SL tablet, Place 1 tablet (0.4 mg total) under the tongue every 5 (five) minutes as needed for chest pain. Reported on 02/25/2016, Disp: 20 tablet, Rfl: 0   rosuvastatin (CRESTOR) 20 MG tablet, Take 1 tablet (20 mg total) by mouth at bedtime., Disp: 90 tablet, Rfl: 0   SYMBICORT 160-4.5 MCG/ACT inhaler, Inhale 2 puffs into the lungs in the morning and at bedtime., Disp: , Rfl:    diphenhydrAMINE (BENADRYL) 50 MG tablet, Take 1 tablet (50 mg total) by mouth once for 1 dose. Take one tablet one hour prior to your heart catheterization., Disp: 1 tablet, Rfl: 0   predniSONE (DELTASONE) 50 MG tablet, Take 1 tablet 13 hours prior to your heart catheterization. Take 1 tablet 7 hours prior to heart catheterization. Take 1 tablet 1 hour prior to your heart catheterization., Disp: 3 tablet, Rfl: 0  Orders Placed This Encounter  Procedures   CBC   Basic metabolic panel   B Nat Peptide   PCV ECHOCARDIOGRAM COMPLETE    Patient Instructions  Prednisone: Take 1 tablet 13 hours prior to your heart catheterization. Take 1 tablet 7 hours prior to heart catheterization. Take 1 tablet 1 hour prior to your heart catheterization.  Benadryl: Take 1 tablet 1 hour prior to the procedure.   --Continue cardiac medications as reconciled in final medication list. --Return in about 3 weeks (around 05/29/2022) for Post heart catheterization. or sooner if needed. --Continue follow-up with your primary care physician regarding the management of your other chronic comorbid conditions.  Patient's questions and concerns were addressed to his satisfaction. He voices understanding of the instructions provided during this encounter.   This note was created using a voice recognition software as a result there may be grammatical errors inadvertently enclosed that do not reflect the nature of this encounter. Every attempt is made to correct such  errors.  Rex Kras, [place], [facility]  Pager: [phone] Office: [phone]

## 2022-05-08 NOTE — Progress Notes (Signed)
ID:  Brent Mans., DOB May 03, 1956, MRN [id number]  PCP:  Brent Ebbs, MD  Cardiologist:  Brent Kras, DO, [facility] (established care 03/26/2022)  Date: 05/08/22 Last Office Visit: 03/26/2022  Chief Complaint  Patient presents with   Results   Follow-up   HPI  Brent Byrd. is a 66 y.o. African-American male who presents to the clinic for evaluation of preoperative risk stratification at the request of Brent Ebbs, MD. His past medical history and cardiovascular risk factors include: Hypertension, COPD, hyperlipidemia, smoker, Hx alcohol abuse, history of DVT, history of stroke with left foot drop, non-insulin-dependent diabetes.  Patient referred to the practice for preoperative risk stratification for left knee scope with Dr. Mardelle Byrd.  He denies anginal discomfort but does have shortness of breath which is likely due to his history of smoking and COPD; however, since his overall functional capacity was limited and multiple cardiovascular risk factors which are not well optimized shared decision was to proceed with echo and stress test.  His nuclear stress test was reported to be high risk and therefore is requested to come in sooner than the scheduled office visit to discuss the findings and symptoms.  Clinically denies anginal discomfort but continues to have effort related dyspnea with minimal day-to-day activity.  FUNCTIONAL STATUS: Before the left knee pain would walk 2 miles w/ his daughter. Last occurrence atleast 1.5 months ago.    ALLERGIES: Allergies  Allergen Reactions   Iodine Hives and Other (See Comments)   Omnipaque [Iohexol] Other (See Comments)    IV Dye-Sneezing    MEDICATION LIST PRIOR TO VISIT: Current Meds  Medication Sig   albuterol (VENTOLIN HFA) 108 (90 Base) MCG/ACT inhaler Inhale 1-2 puffs into the lungs.   amLODipine (NORVASC) 5 MG tablet Take 5 mg by mouth daily.   aspirin EC 81 MG tablet Take 1 tablet (81 mg total) by mouth daily. Swallow  whole.   ELIQUIS 5 MG TABS tablet Take 5 mg by mouth 2 (two) times daily.   folic acid (FOLVITE) 1 MG tablet Take 1 tablet (1 mg total) by mouth daily.   hydrochlorothiazide (MICROZIDE) 12.5 MG capsule Take 1 capsule (12.5 mg total) by mouth daily.   metFORMIN (GLUCOPHAGE) 500 MG tablet Take by mouth 2 (two) times daily with a meal.   nitroGLYCERIN (NITROSTAT) 0.4 MG SL tablet Place 1 tablet (0.4 mg total) under the tongue every 5 (five) minutes as needed for chest pain. Reported on 02/25/2016   rosuvastatin (CRESTOR) 20 MG tablet Take 1 tablet (20 mg total) by mouth at bedtime.   SYMBICORT 160-4.5 MCG/ACT inhaler Inhale 2 puffs into the lungs in the morning and at bedtime.   [DISCONTINUED] diphenhydrAMINE (BENADRYL) 50 MG tablet Take 1 tablet (50 mg total) by mouth once for 1 dose. Take one tablet one hour prior to your heart catheterization.   [DISCONTINUED] predniSONE (DELTASONE) 50 MG tablet Take 1 tablet 13 hours prior to your heart catheterization. Take 1 tablet 7 hours prior to heart catheterization. Take 1 tablet 1 hour prior to your heart catheterization.     PAST MEDICAL HISTORY: Past Medical History:  Diagnosis Date   Alcohol abuse    Arthritis    Left knee, lower back   Chest pain    hx   COPD (chronic obstructive pulmonary disease) ([place])    "[place] said I don't have this" (10/26/2016)   Depression    no meds   DVT (deep venous thrombosis) ([place]) 2008   Right leg-  txed with coumadin    DVT (deep venous thrombosis) ([place]) 2010; 2017   RLE; RLE   Gastroesophageal reflux disease 2014   Hypertension     per patiente-controlled, was taken off BP med   MI, old ~ 2007   Stroke ([place]) 01/2012   denies residual on 10/26/2016   Substance abuse ([place])    Alcohol dependence Hx   Tobacco abuse     PAST SURGICAL HISTORY: Past Surgical History:  Procedure Laterality Date   BACK SURGERY     GANGLION CYST EXCISION Left    HAMMER TOE SURGERY Left 07/2012   2nd toe/08/05/2012    LUMBAR DISC SURGERY  06/2007   Herniated disc/notes 02/25/2011   WISDOM TOOTH EXTRACTION      FAMILY HISTORY: The patient family history includes Asthma in his mother; Cancer in his father and sister; Diabetes in his sister; Thrombosis in his sister.  SOCIAL HISTORY:  The patient  reports that he has been smoking cigarettes. He has a 22.50 pack-year smoking history. He has never used smokeless tobacco. He reports that he does not currently use alcohol after a past usage of about 1.0 standard drink of alcohol per week. He reports that he does not currently use drugs after having used the following drugs: "Crack" cocaine and Cocaine.  REVIEW OF SYSTEMS: Review of Systems  Cardiovascular:  Positive for dyspnea on exertion. Negative for chest pain, claudication, irregular heartbeat, leg swelling, near-syncope, orthopnea, palpitations, paroxysmal nocturnal dyspnea and syncope.  Respiratory:  Positive for shortness of breath.   Hematologic/Lymphatic: Negative for bleeding problem.  Musculoskeletal:  Positive for arthritis and joint pain. Negative for muscle cramps and myalgias.  Neurological:  Negative for dizziness and light-headedness.    PHYSICAL EXAM:    05/08/2022   10:49 AM 04/07/2022    6:03 PM 04/07/2022    5:16 PM  Vitals with BMI  Height $Remov'6\' 4"'MrzvXG$   '6\' 4"'$   Weight 306 lbs  305 lbs  BMI 57.01  77.93  Systolic 903 009   Diastolic 90 83   Pulse 60 87    CONSTITUTIONAL: Well-developed and well-nourished. No acute distress.  SKIN: Skin is warm and dry. No rash noted. No cyanosis. No pallor. No jaundice HEAD: Normocephalic and atraumatic.  EYES: No scleral icterus MOUTH/THROAT: Moist oral membranes.  NECK: No JVD present. No thyromegaly noted. No carotid bruits  CHEST Normal respiratory effort. No intercostal retractions  LUNGS: Clear to auscultation bilaterally.  No stridor. No wheezes. No rales.  CARDIOVASCULAR: Regular rate and rhythm, positive S1-S2, no murmurs rubs or gallops  appreciated. ABDOMINAL: Obese, soft, nontender, nondistended, positive bowel sounds in all 4 quadrants, no apparent ascites.  EXTREMITIES: Trace bilateral peripheral edema, warm to touch, left knee brace  HEMATOLOGIC: No significant bruising NEUROLOGIC: Oriented to person, place, and time. Nonfocal. Normal muscle tone.  PSYCHIATRIC: Normal mood and affect. Normal behavior. Cooperative No change in physical examination since last office visit  CARDIAC DATABASE: EKG: 03/26/2022: NSR, 61 bpm, consider old anteroseptal infarct, diffuse nonspecific T wave abnormality.   Echocardiogram: 04/06/2022:  Left ventricle cavity is normal in size. Mild concentric hypertrophy of the left ventricle. Normal global wall motion. Normal LV systolic function with EF 58%. Doppler evidence of grade I (impaired) diastolic dysfunction, normal LAP.  The aortic root is dilated, maximal dimension 4.4 cm at sinotubular junction.  Structurally normal trileaflet aortic valve.  Mild (Grade I) aortic regurgitation.  Mild to moderate mitral regurgitation.  Normal right atrial pressure.  Stress Testing: Occupational psychologist  stress test 04/13/2022: Nondiagnostic ECG stress. The heart rate response was consistent with Regadenoson.  Myocardial perfusion is abnormal.  Mild degree large extent perfusion defect consistent with moderate ischemia located in the apical anterior wall, apical lateral wall, mid inferolateral wall and basal inferolateral wall (Left Anterior Descending Artery and Left Circumflex Artery regions) of left ventricle. LV is dilated in both rest and stress, more dilated in stress although calculated TID index is normal. Overall LV systolic function is abnormal with inferior and inferolateral akinesis and global hypokinesis.   Stress LVEF mildly reduced at LV EF: 42%.  No previous exam available for comparison. High risk and multivessel disease cannot be excluded.  Heart Catheterization: None  LABORATORY  DATA: External Labs: Collected: December 24, 2021 available in Care Everywhere, [facility] health Hemoglobin A1c 6.5 Sodium 137, potassium 4.4, chloride 105, bicarb 27.5. BUN 11, creatinine 0.67. eGFR >60. AST 20, ALT 17, alkaline phosphatase 60 Total cholesterol 175, triglycerides 69, HDL 66, LDL 95, non-HDL 109  IMPRESSION:    ICD-10-CM   1. Abnormal nuclear cardiac imaging test  R93.1 CBC    Basic metabolic panel    aspirin EC 81 MG tablet    rosuvastatin (CRESTOR) 20 MG tablet    2. Shortness of breath  R06.02 hydrochlorothiazide (MICROZIDE) 12.5 MG capsule    B Nat Peptide    3. Preop cardiovascular exam  Z01.810     4. Aortic root dilatation (HCC)  I77.810 PCV ECHOCARDIOGRAM COMPLETE    5. Benign hypertension  I10 hydrochlorothiazide (MICROZIDE) 12.5 MG capsule    6. Non-insulin dependent type 2 diabetes mellitus (HCC)  E11.9 rosuvastatin (CRESTOR) 20 MG tablet    7. Mixed hyperlipidemia  E78.2     8. Cigarette smoker  F17.210     9. History of deep venous thrombosis  Z86.718     10. Chronic obstructive pulmonary disease, unspecified COPD type ([place])  J44.9     11. Contrast media allergy  Z91.041 predniSONE (DELTASONE) 50 MG tablet    diphenhydrAMINE (BENADRYL) 50 MG tablet    DISCONTINUED: predniSONE (DELTASONE) 50 MG tablet    DISCONTINUED: diphenhydrAMINE (BENADRYL) 50 MG tablet        RECOMMENDATIONS: Brent Byrd. is a 66 y.o. African-American male whose past medical history and cardiac risk factors include: Hypertension, COPD, hyperlipidemia, smoker, Hx alcohol abuse, history of DVT, history of stroke with left foot drop, non-insulin-dependent diabetes.  Abnormal nuclear stress test MPI performed as part of preoperative risk stratification as he is experiencing dyspnea at rest and also with effort related activities.  And reduced functional capacity at baseline.    Recent MPI notes high risk findings such as reversible ischemia related to coronary distributions,  mildly reduced LVEF, regional wall motion abnormalities on SPECT images, and visual transient ischemic dilatation.  Discussed risks, benefits, alternatives and limitations of both coronary CTA versus invasive angiography.  Patient would like to proceed with left heart catheterization with possible intervention.   The procedure of left heart catheterization with possible intervention was explained to the patient in detail.  The indication, alternatives, risks and benefits were reviewed.  Complications include but not limited to bleeding, infection, vascular injury, stroke, myocardial infarction, arrhythmia (requiring medical or cardiopulmonary resuscitation), kidney injury (requiring short-term or long-term hemodialysis), radiation-related injury in the case of prolonged fluoroscopy use, emergent cardiac surgery, temporary or permanent pacemaker, and death. The patient understands the risks of serious complication is 1-2 in 0017 with diagnostic cardiac cath and 1-2% or less with angioplasty/stenting.  Patient voices understanding and provides verbal feedback his questions and concerns are addressed to his satisfaction and patient wishes to proceed with coronary angiography with possible PCI.  Start aspirin 81 mg p.o. daily & Crestor 20 mg p.o. nightly.   Check CBC, BMP, and BNP prior to his heart catheterization.  Patient does have contrast allergy: Prednisone 50 mg p.o. x1 17 hours prior to his procedure.  50 mg x 1 7 hours prior to his procedure.  50 mg x 1 one hour prior to the procedure.  Benadryl 50 mg p.o. x 1 hour prior to the procedure.   Preop cardiovascular exam Being considered for left knee scope with Dr. Mardelle Byrd.  Date to be determined. Given multiple cardiovascular risk factors and unknown functional capacity he underwent ischemic work-up with echo and stress test.  Results reviewed with the patient and noted above for further reference. Further recommendations to follow after left  heart catheterization.  Shortness of breath Multifactorial. Recommend pulmonary evaluation prior to his upcoming noncardiac surgery given his history of active cigarette smoking and COPD. Ischemic work-up as outlined above  Aortic root dilatation ([place]) Noted on routine echocardiogram June 2023.  We will recheck echocardiogram in 1 year to reevaluate disease progression -order placed.  Non-insulin dependent type 2 diabetes mellitus (HCC) Last hemoglobin A1c 6.5. Currently managed by primary care provider.  Mixed hyperlipidemia Recommend statin therapy given his history of non-insulin-dependent diabetes. Currently managed by primary care provider.  Benign hypertension Office blood pressures are well controlled. Medications reconciled. Currently managed by primary care provider.  Cigarette smoker Tobacco cessation counseling: Currently smoking 1 packs/day   Patient is not willing to quit at this time. 5 mins were spent counseling patient cessation techniques. We discussed various methods to help quit smoking, including deciding on a date to quit, joining a support group, pharmacological agents- nicotine gum/patch/lozenges.  History of deep venous thrombosis Currently on Eliquis, anticoagulation managed by PCP (per patient)ds  Chronic obstructive pulmonary disease, unspecified COPD type ([place]) We encouraged the importance of smoking cessation. Patient is encouraged to follow-up with pulmonary medicine -May benefit from inhalers We will defer the need for pulmonary evaluation prior to surgery to primary team.   FINAL MEDICATION LIST END OF ENCOUNTER: Meds ordered this encounter  Medications   aspirin EC 81 MG tablet    Sig: Take 1 tablet (81 mg total) by mouth daily. Swallow whole.    Dispense:  30 tablet    Refill:  12   rosuvastatin (CRESTOR) 20 MG tablet    Sig: Take 1 tablet (20 mg total) by mouth at bedtime.    Dispense:  90 tablet    Refill:  0   hydrochlorothiazide  (MICROZIDE) 12.5 MG capsule    Sig: Take 1 capsule (12.5 mg total) by mouth daily.    Dispense:  90 capsule    Refill:  0   DISCONTD: predniSONE (DELTASONE) 50 MG tablet    Sig: Take 1 tablet 13 hours prior to your heart catheterization. Take 1 tablet 7 hours prior to heart catheterization. Take 1 tablet 1 hour prior to your heart catheterization.    Dispense:  3 tablet    Refill:  0   DISCONTD: diphenhydrAMINE (BENADRYL) 50 MG tablet    Sig: Take 1 tablet (50 mg total) by mouth once for 1 dose. Take one tablet one hour prior to your heart catheterization.    Dispense:  1 tablet    Refill:  0   predniSONE (DELTASONE) 50 MG  tablet    Sig: Take 1 tablet 13 hours prior to your heart catheterization. Take 1 tablet 7 hours prior to heart catheterization. Take 1 tablet 1 hour prior to your heart catheterization.    Dispense:  3 tablet    Refill:  0   diphenhydrAMINE (BENADRYL) 50 MG tablet    Sig: Take 1 tablet (50 mg total) by mouth once for 1 dose. Take one tablet one hour prior to your heart catheterization.    Dispense:  1 tablet    Refill:  0    Medications Discontinued During This Encounter  Medication Reason   predniSONE (DELTASONE) 50 MG tablet    diphenhydrAMINE (BENADRYL) 50 MG tablet       Current Outpatient Medications:    albuterol (VENTOLIN HFA) 108 (90 Base) MCG/ACT inhaler, Inhale 1-2 puffs into the lungs., Disp: , Rfl:    amLODipine (NORVASC) 5 MG tablet, Take 5 mg by mouth daily., Disp: , Rfl:    aspirin EC 81 MG tablet, Take 1 tablet (81 mg total) by mouth daily. Swallow whole., Disp: 30 tablet, Rfl: 12   ELIQUIS 5 MG TABS tablet, Take 5 mg by mouth 2 (two) times daily., Disp: , Rfl:    folic acid (FOLVITE) 1 MG tablet, Take 1 tablet (1 mg total) by mouth daily., Disp: 30 tablet, Rfl: 0   hydrochlorothiazide (MICROZIDE) 12.5 MG capsule, Take 1 capsule (12.5 mg total) by mouth daily., Disp: 90 capsule, Rfl: 0   metFORMIN (GLUCOPHAGE) 500 MG tablet, Take by mouth 2  (two) times daily with a meal., Disp: , Rfl:    nitroGLYCERIN (NITROSTAT) 0.4 MG SL tablet, Place 1 tablet (0.4 mg total) under the tongue every 5 (five) minutes as needed for chest pain. Reported on 02/25/2016, Disp: 20 tablet, Rfl: 0   rosuvastatin (CRESTOR) 20 MG tablet, Take 1 tablet (20 mg total) by mouth at bedtime., Disp: 90 tablet, Rfl: 0   SYMBICORT 160-4.5 MCG/ACT inhaler, Inhale 2 puffs into the lungs in the morning and at bedtime., Disp: , Rfl:    diphenhydrAMINE (BENADRYL) 50 MG tablet, Take 1 tablet (50 mg total) by mouth once for 1 dose. Take one tablet one hour prior to your heart catheterization., Disp: 1 tablet, Rfl: 0   predniSONE (DELTASONE) 50 MG tablet, Take 1 tablet 13 hours prior to your heart catheterization. Take 1 tablet 7 hours prior to heart catheterization. Take 1 tablet 1 hour prior to your heart catheterization., Disp: 3 tablet, Rfl: 0  Orders Placed This Encounter  Procedures   CBC   Basic metabolic panel   B Nat Peptide   PCV ECHOCARDIOGRAM COMPLETE    Patient Instructions  Prednisone: Take 1 tablet 13 hours prior to your heart catheterization. Take 1 tablet 7 hours prior to heart catheterization. Take 1 tablet 1 hour prior to your heart catheterization.  Benadryl: Take 1 tablet 1 hour prior to the procedure.   --Continue cardiac medications as reconciled in final medication list. --Return in about 3 weeks (around 05/29/2022) for Post heart catheterization. or sooner if needed. --Continue follow-up with your primary care physician regarding the management of your other chronic comorbid conditions.  Patient's questions and concerns were addressed to his satisfaction. He voices understanding of the instructions provided during this encounter.   This note was created using a voice recognition software as a result there may be grammatical errors inadvertently enclosed that do not reflect the nature of this encounter. Every attempt is made to correct such  errors.  Brent Byrd, [place], [facility]  Pager: [phone] Office: [phone]

## 2022-05-08 NOTE — Patient Instructions (Addendum)
Prednisone: Take 1 tablet 13 hours prior to your heart catheterization. Take 1 tablet 7 hours prior to heart catheterization. Take 1 tablet 1 hour prior to your heart catheterization.  Benadryl: Take 1 tablet 1 hour prior to the procedure.

## 2022-05-14 LAB — CBC
Hematocrit: 40.9 % (ref 37.5–51.0)
Hemoglobin: 13.9 g/dL (ref 13.0–17.7)
MCH: 30.2 pg (ref 26.6–33.0)
MCHC: 34 g/dL (ref 31.5–35.7)
MCV: 89 fL (ref 79–97)
Platelets: 226 10*3/uL (ref 150–450)
RBC: 4.6 x10E6/uL (ref 4.14–5.80)
RDW: 13.8 % (ref 11.6–15.4)
WBC: 5.8 10*3/uL (ref 3.4–10.8)

## 2022-05-14 LAB — BRAIN NATRIURETIC PEPTIDE: BNP: 32.4 pg/mL (ref 0.0–100.0)

## 2022-05-14 LAB — BASIC METABOLIC PANEL
BUN/Creatinine Ratio: 13 (ref 10–24)
BUN: 13 mg/dL (ref 8–27)
CO2: 21 mmol/L (ref 20–29)
Calcium: 10 mg/dL (ref 8.6–10.2)
Chloride: 97 mmol/L (ref 96–106)
Creatinine, Ser: 0.98 mg/dL (ref 0.76–1.27)
Glucose: 106 mg/dL — ABNORMAL HIGH (ref 70–99)
Potassium: 4.5 mmol/L (ref 3.5–5.2)
Sodium: 135 mmol/L (ref 134–144)
eGFR: 86 mL/min/{1.73_m2} (ref >59)

## 2022-05-19 ENCOUNTER — Other Ambulatory Visit: Payer: Self-pay

## 2022-05-19 ENCOUNTER — Encounter (HOSPITAL_COMMUNITY)
Admission: RE | Disposition: A | Discharge status: Home / Self Care | Payer: Self-pay | Source: Home / Self Care | Attending: Cardiology

## 2022-05-19 ENCOUNTER — Ambulatory Visit (HOSPITAL_COMMUNITY)
Admission: RE | Admit: 2022-05-19 | Discharge: 2022-05-19 | Disposition: A | Discharge status: Home / Self Care | Payer: Medicaid Other | Attending: Cardiology | Admitting: Cardiology

## 2022-05-19 ENCOUNTER — Encounter (HOSPITAL_COMMUNITY): Payer: Self-pay | Admitting: Cardiology

## 2022-05-19 DIAGNOSIS — Z7901 Long term (current) use of anticoagulants: Secondary | ICD-10-CM | POA: Insufficient documentation

## 2022-05-19 DIAGNOSIS — F17210 Nicotine dependence, cigarettes, uncomplicated: Secondary | ICD-10-CM | POA: Diagnosis not present

## 2022-05-19 DIAGNOSIS — R931 Abnormal findings on diagnostic imaging of heart and coronary circulation: Secondary | ICD-10-CM | POA: Diagnosis present

## 2022-05-19 DIAGNOSIS — E782 Mixed hyperlipidemia: Secondary | ICD-10-CM | POA: Insufficient documentation

## 2022-05-19 DIAGNOSIS — E119 Type 2 diabetes mellitus without complications: Secondary | ICD-10-CM | POA: Diagnosis not present

## 2022-05-19 DIAGNOSIS — Z91041 Radiographic dye allergy status: Secondary | ICD-10-CM | POA: Diagnosis not present

## 2022-05-19 DIAGNOSIS — R0602 Shortness of breath: Secondary | ICD-10-CM | POA: Insufficient documentation

## 2022-05-19 DIAGNOSIS — I77819 Aortic ectasia, unspecified site: Secondary | ICD-10-CM | POA: Diagnosis not present

## 2022-05-19 DIAGNOSIS — R9439 Abnormal result of other cardiovascular function study: Secondary | ICD-10-CM

## 2022-05-19 DIAGNOSIS — J449 Chronic obstructive pulmonary disease, unspecified: Secondary | ICD-10-CM | POA: Insufficient documentation

## 2022-05-19 DIAGNOSIS — Z86718 Personal history of other venous thrombosis and embolism: Secondary | ICD-10-CM | POA: Insufficient documentation

## 2022-05-19 DIAGNOSIS — R0609 Other forms of dyspnea: Secondary | ICD-10-CM

## 2022-05-19 DIAGNOSIS — I10 Essential (primary) hypertension: Secondary | ICD-10-CM | POA: Insufficient documentation

## 2022-05-19 HISTORY — PX: LEFT HEART CATH AND CORONARY ANGIOGRAPHY: CATH118249

## 2022-05-19 SURGERY — LEFT HEART CATH AND CORONARY ANGIOGRAPHY
Anesthesia: LOCAL

## 2022-05-19 MED ORDER — ASPIRIN 81 MG PO TBEC
DELAYED_RELEASE_TABLET | ORAL | Status: AC
  Filled 2022-05-19: qty 1

## 2022-05-19 MED ORDER — SODIUM CHLORIDE 0.9 % IV SOLN: 250.0000 mL | INTRAVENOUS | Status: DC | PRN

## 2022-05-19 MED ORDER — FENTANYL CITRATE (PF) 100 MCG/2ML IJ SOLN
INTRAMUSCULAR | Status: AC
  Filled 2022-05-19: qty 2

## 2022-05-19 MED ORDER — VERAPAMIL HCL 2.5 MG/ML IV SOLN
INTRAVENOUS | Status: AC
  Filled 2022-05-19: qty 2

## 2022-05-19 MED ORDER — SODIUM CHLORIDE 0.9% FLUSH
3.0000 mL | Freq: Two times a day (BID) | INTRAVENOUS | Status: DC
Start: 2022-05-19 — End: 2022-05-19

## 2022-05-19 MED ORDER — MIDAZOLAM HCL 2 MG/2ML IJ SOLN
INTRAMUSCULAR | Status: AC
  Filled 2022-05-19: qty 2

## 2022-05-19 MED ORDER — DIPHENHYDRAMINE HCL 50 MG/ML IJ SOLN: 25.0000 mg | Freq: Once | INTRAMUSCULAR | Status: DC

## 2022-05-19 MED ORDER — SODIUM CHLORIDE 0.9 % WEIGHT BASED INFUSION
3.0000 mL/kg/h | INTRAVENOUS | Status: DC
Start: 2022-05-20 — End: 2022-05-19
  Administered 2022-05-19: 3 mL/kg/h via INTRAVENOUS

## 2022-05-19 MED ORDER — LIDOCAINE HCL (PF) 1 % IJ SOLN
INTRAMUSCULAR | Status: AC
  Filled 2022-05-19: qty 30

## 2022-05-19 MED ORDER — ASPIRIN 81 MG PO CHEW
81.0000 mg | CHEWABLE_TABLET | ORAL | Status: AC
  Administered 2022-05-19: 81 mg via ORAL

## 2022-05-19 MED ORDER — HEPARIN SODIUM (PORCINE) 1000 UNIT/ML IJ SOLN
INTRAMUSCULAR | Status: DC | PRN
  Administered 2022-05-19: 5000 [IU] via INTRAVENOUS

## 2022-05-19 MED ORDER — HEPARIN SODIUM (PORCINE) 1000 UNIT/ML IJ SOLN
INTRAMUSCULAR | Status: AC
  Filled 2022-05-19: qty 10

## 2022-05-19 MED ORDER — LIDOCAINE HCL (PF) 1 % IJ SOLN
INTRAMUSCULAR | Status: DC | PRN
  Administered 2022-05-19: 2 mL

## 2022-05-19 MED ORDER — SODIUM CHLORIDE 0.9 % WEIGHT BASED INFUSION: 1.0000 mL/kg/h | INTRAVENOUS | Status: DC

## 2022-05-19 MED ORDER — IOHEXOL 350 MG/ML SOLN
INTRAVENOUS | Status: DC | PRN
  Administered 2022-05-19: 120 mL

## 2022-05-19 MED ORDER — VERAPAMIL HCL 2.5 MG/ML IV SOLN
INTRAVENOUS | Status: DC | PRN
  Administered 2022-05-19 (×2): 5 mL via INTRA_ARTERIAL

## 2022-05-19 MED ORDER — HEPARIN (PORCINE) IN NACL 1000-0.9 UT/500ML-% IV SOLN
INTRAVENOUS | Status: AC
  Filled 2022-05-19: qty 1000

## 2022-05-19 MED ORDER — MIDAZOLAM HCL 2 MG/2ML IJ SOLN
INTRAMUSCULAR | Status: DC | PRN
  Administered 2022-05-19: 2 mg via INTRAVENOUS

## 2022-05-19 MED ORDER — HEPARIN (PORCINE) IN NACL 1000-0.9 UT/500ML-% IV SOLN
INTRAVENOUS | Status: DC | PRN
  Administered 2022-05-19 (×2): 500 mL

## 2022-05-19 MED ORDER — SODIUM CHLORIDE 0.9% FLUSH: 3.0000 mL | INTRAVENOUS | Status: DC | PRN

## 2022-05-19 MED ORDER — FENTANYL CITRATE (PF) 100 MCG/2ML IJ SOLN
INTRAMUSCULAR | Status: DC | PRN
  Administered 2022-05-19: 25 ug via INTRAVENOUS

## 2022-05-19 SURGICAL SUPPLY — 16 items
BAND CMPR LRG ZPHR (HEMOSTASIS) ×1
BAND ZEPHYR COMPRESS 30 LONG (HEMOSTASIS) ×1 IMPLANT
CATH HEADHUNTER 5F 125CM (CATHETERS) ×1 IMPLANT
CATH INFINITI 5FR AL1 (CATHETERS) ×1 IMPLANT
CATH INFINITI 5FR JK (CATHETERS) ×1 IMPLANT
CATH INFINITI 5FR JR4 125CM (CATHETERS) ×1 IMPLANT
CATH INFINITI JR4 5F (CATHETERS) ×1 IMPLANT
CATH INFINITI VERT 5FR 125CM (CATHETERS) ×1 IMPLANT
CATH OPTITORQUE TIG 4.0 5F (CATHETERS) ×1 IMPLANT
GLIDESHEATH SLEND A-KIT 6F 22G (SHEATH) ×1 IMPLANT
GUIDEWIRE INQWIRE 1.5J.035X260 (WIRE) IMPLANT
INQWIRE 1.5J .035X260CM (WIRE) ×4
KIT HEART LEFT (KITS) ×3 IMPLANT
PACK CARDIAC CATHETERIZATION (CUSTOM PROCEDURE TRAY) ×3 IMPLANT
TRANSDUCER W/STOPCOCK (MISCELLANEOUS) ×3 IMPLANT
TUBING CIL FLEX 10 FLL-RA (TUBING) ×3 IMPLANT

## 2022-05-19 NOTE — Progress Notes (Signed)
Pt told to take prednisone and benadryl PO that he was prescribed prior to procedure. Dr Einar Gip called to verify order and per Dr Einar Gip told to give PO benadryl vs IV benadryl .me

## 2022-05-19 NOTE — Discharge Instructions (Signed)

## 2022-05-19 NOTE — Interval H&P Note (Signed)
History and Physical Interval Note:  05/19/2022 1:34 PM  Brent Byrd.  has presented today for surgery, with the diagnosis of heart failure.  The various methods of treatment have been discussed with the patient and family. After consideration of risks, benefits and other options for treatment, the patient has consented to  Procedure(s): LEFT HEART CATH AND CORONARY ANGIOGRAPHY (N/A) and possible angioplasty as a surgical intervention.  The patient's history has been reviewed, patient examined, no change in status, stable for surgery.  I have reviewed the patient's chart and labs.  Questions were answered to the patient's satisfaction.   Cath Lab Visit (complete for each Cath Lab visit)  Clinical Evaluation Leading to the Procedure:   ACS: No.  Non-ACS:    Anginal Classification: CCS II  Anti-ischemic medical therapy: Minimal Therapy (1 class of medications)  Non-Invasive Test Results: Intermediate-risk stress test findings: cardiac mortality 1-3%/year  Prior CABG: No previous CABG   Brent Byrd

## 2022-06-02 ENCOUNTER — Ambulatory Visit: Payer: Medicaid Other | Admitting: Cardiology

## 2022-06-08 ENCOUNTER — Ambulatory Visit: Payer: Medicaid Other | Admitting: Cardiology

## 2022-06-08 ENCOUNTER — Encounter: Payer: Self-pay | Admitting: Cardiology

## 2022-06-08 VITALS — BP 129/93 | HR 74 | Temp 98.2°F | Resp 16 | Ht 76.0 in | Wt 313.6 lb

## 2022-06-08 DIAGNOSIS — R0602 Shortness of breath: Secondary | ICD-10-CM

## 2022-06-08 DIAGNOSIS — I10 Essential (primary) hypertension: Secondary | ICD-10-CM

## 2022-06-08 DIAGNOSIS — R931 Abnormal findings on diagnostic imaging of heart and coronary circulation: Secondary | ICD-10-CM

## 2022-06-08 DIAGNOSIS — I77810 Thoracic aortic ectasia: Secondary | ICD-10-CM

## 2022-06-08 DIAGNOSIS — Z01810 Encounter for preprocedural cardiovascular examination: Secondary | ICD-10-CM

## 2022-06-08 DIAGNOSIS — Z86718 Personal history of other venous thrombosis and embolism: Secondary | ICD-10-CM

## 2022-06-08 DIAGNOSIS — Z87891 Personal history of nicotine dependence: Secondary | ICD-10-CM

## 2022-06-08 DIAGNOSIS — Z9889 Other specified postprocedural states: Secondary | ICD-10-CM

## 2022-06-08 DIAGNOSIS — E119 Type 2 diabetes mellitus without complications: Secondary | ICD-10-CM

## 2022-06-08 DIAGNOSIS — E782 Mixed hyperlipidemia: Secondary | ICD-10-CM

## 2022-06-08 DIAGNOSIS — J449 Chronic obstructive pulmonary disease, unspecified: Secondary | ICD-10-CM

## 2022-06-08 MED ORDER — NITROGLYCERIN 0.4 MG SL SUBL: 0.4000 mg | SUBLINGUAL_TABLET | SUBLINGUAL | 0 refills | Status: DC | PRN

## 2022-06-08 NOTE — Addendum Note (Signed)
Addended by: Oran Rein on: 06/08/2022 06:18 PM   Modules accepted: Orders

## 2022-06-08 NOTE — Progress Notes (Addendum)
ID:  Brent Mans., DOB 08-31-1956, MRN [id number]  PCP:  Brent Ebbs, MD  Cardiologist:  Brent Kras, DO, [facility] (established care 03/26/2022)  Date: 06/08/22 Last Office Visit: 03/26/2022  Chief Complaint  Patient presents with   Post Heart Catheterization   Follow-up   HPI  Brent Byrd. is a 66 y.o. African-American male who presents to the clinic for evaluation of preoperative risk stratification at the request of Brent Ebbs, MD. His past medical history and cardiovascular risk factors include: Hypertension, COPD, hyperlipidemia, smoker, Hx alcohol abuse, history of DVT, history of stroke with left foot drop, non-insulin-dependent diabetes.  Patient referred to the practice for preoperative risk stratification to undergo left knee scope with Brent Byrd.  Patient was not experiencing anginal discomfort but was having shortness of breath which was multifactorial including prolonged history of smoking, underlying COPD; however, due to reduced functional capacity and multiple cardiovascular risk factors underlying ischemia cannot be ruled out.  He underwent an echocardiogram and nuclear stress test.  Nuclear stress test was reported to be high risk with concerns for multivessel CAD.  Patient was scheduled for left heart catheterization to evaluate for obstructive CAD.  Angiography films reviewed with the patient at today's visit.  Patient is noted to have ectatic vessels along the left coronary distribution and nonselective injection of the RCA noted small and nondominant vessel.  It was felt that the perfusion defects noted on MPI and likely due to endothelial dysfunction due to slow flow.  Since May 29, 2022 patient has stopped smoking.  He is congratulated on his efforts.  He has followed up with pulmonary medicine and plans to undergo sleep study as well as lung cancer screening.  Patient has gained approximately 10 pounds since June 2023 when he established care with  cardiology.  FUNCTIONAL STATUS: Before the left knee pain would walk 2 miles w/ his daughter. Last occurrence atleast 1.5 months ago.    ALLERGIES: Allergies  Allergen Reactions   Iodine Hives and Other (See Comments)   Omnipaque [Iohexol] Other (See Comments)    IV Dye-Sneezing    MEDICATION LIST PRIOR TO VISIT: Current Meds  Medication Sig   amLODipine (NORVASC) 5 MG tablet Take 5 mg by mouth daily.   aspirin EC 81 MG tablet Take 1 tablet (81 mg total) by mouth daily. Swallow whole.   Budeson-Glycopyrrol-Formoterol (BREZTRI AEROSPHERE) 160-9-4.8 MCG/ACT AERO Inhale into the lungs.   ELIQUIS 5 MG TABS tablet Take 5 mg by mouth 2 (two) times daily.   hydrochlorothiazide (MICROZIDE) 12.5 MG capsule Take 1 capsule (12.5 mg total) by mouth daily.   metFORMIN (GLUCOPHAGE) 500 MG tablet Take 500 mg by mouth 2 (two) times daily with a meal.   nitroGLYCERIN (NITROSTAT) 0.4 MG SL tablet Place 1 tablet (0.4 mg total) under the tongue every 5 (five) minutes as needed for chest pain. If you require more than two tablets five minutes apart go to the nearest ER via EMS.   omeprazole (PRILOSEC) 20 MG capsule Take 20 mg by mouth daily.   rosuvastatin (CRESTOR) 20 MG tablet Take 1 tablet (20 mg total) by mouth at bedtime.   varenicline (CHANTIX) 0.5 MG tablet Take 0.5 mg by mouth as directed.   [DISCONTINUED] atorvastatin (LIPITOR) 40 MG tablet Take 40 mg by mouth daily.     PAST MEDICAL HISTORY: Past Medical History:  Diagnosis Date   Alcohol abuse    Arthritis    Left knee, lower back   Chest pain  hx   COPD (chronic obstructive pulmonary disease) ([place])    "[place] said I don't have this" (10/26/2016)   Depression    no meds   DVT (deep venous thrombosis) ([place]) 2008   Right leg- txed with coumadin    DVT (deep venous thrombosis) ([place]) 2010; 2017   RLE; RLE   Gastroesophageal reflux disease 2014   Hypertension     per patiente-controlled, was taken off BP med   MI, old ~ 2007    Stroke ([place]) 01/2012   denies residual on 10/26/2016   Substance abuse ([place])    Alcohol dependence Hx   Tobacco abuse     PAST SURGICAL HISTORY: Past Surgical History:  Procedure Laterality Date   BACK SURGERY     GANGLION CYST EXCISION Left    HAMMER TOE SURGERY Left 07/2012   2nd toe/08/05/2012   LEFT HEART CATH AND CORONARY ANGIOGRAPHY N/A 05/19/2022   Procedure: LEFT HEART CATH AND CORONARY ANGIOGRAPHY;  Surgeon: [name], MD;  Location: [facility];  Service: Cardiovascular;  Laterality: N/A;   LUMBAR DISC SURGERY  06/2007   Herniated disc/notes 02/25/2011   WISDOM TOOTH EXTRACTION      FAMILY HISTORY: The patient family history includes Asthma in his mother; Cancer in his father and sister; Diabetes in his sister; Thrombosis in his sister.  SOCIAL HISTORY:  The patient  reports that he has been smoking cigarettes. He has a 22.50 pack-year smoking history. He has never used smokeless tobacco. He reports that he does not currently use alcohol after a past usage of about 1.0 standard drink of alcohol per week. He reports that he does not currently use drugs after having used the following drugs: "Crack" cocaine and Cocaine.  REVIEW OF SYSTEMS: Review of Systems  Constitutional: Positive for weight gain.  Cardiovascular:  Positive for dyspnea on exertion (Chronic and stable). Negative for chest pain, claudication, irregular heartbeat, leg swelling, near-syncope, orthopnea, palpitations, paroxysmal nocturnal dyspnea and syncope.  Respiratory:  Positive for shortness of breath (Chronic and stable).   Hematologic/Lymphatic: Negative for bleeding problem.  Musculoskeletal:  Positive for arthritis and joint pain. Negative for muscle cramps and myalgias.  Neurological:  Negative for dizziness and light-headedness.    PHYSICAL EXAM:    06/08/2022    3:42 PM 06/08/2022    3:26 PM 05/19/2022    4:49 PM  Vitals with BMI  Height  _0    Weight  313 lbs 10 oz   BMI  02.54    Systolic 270 623 762  Diastolic 93 88 76  Pulse 74 67 75   CONSTITUTIONAL: Appears older than stated age, hemodynamically stable, no acute distress.  SKIN: Skin is warm and dry. No rash noted. No cyanosis. No pallor. No jaundice HEAD: Normocephalic and atraumatic.  EYES: No scleral icterus MOUTH/THROAT: Moist oral membranes.  NECK: No JVD present. No thyromegaly noted. No carotid bruits  CHEST Normal respiratory effort. No intercostal retractions  LUNGS: Clear to auscultation bilaterally.  No stridor. No wheezes. No rales.  CARDIOVASCULAR: Regular rate and rhythm, positive S1-S2, no murmurs rubs or gallops appreciated. ABDOMINAL: Obese, soft, nontender, nondistended, positive bowel sounds in all 4 quadrants, no apparent ascites.  EXTREMITIES: Trace bilateral peripheral edema, warm to touch, left knee brace  HEMATOLOGIC: No significant bruising NEUROLOGIC: Oriented to person, place, and time. Nonfocal. Normal muscle tone.  PSYCHIATRIC: Normal mood and affect. Normal behavior. Cooperative No change in physical examination since last office visit  CARDIAC DATABASE: EKG: 03/26/2022:  NSR, 61 bpm, consider old anteroseptal infarct, diffuse nonspecific T wave abnormality.  06/08/2022: NSR, 61 bpm, nonspecific T wave abnormality, without underlying injury pattern.  Echocardiogram: 04/06/2022:  Left ventricle cavity is normal in size. Mild concentric hypertrophy of the left ventricle. Normal global wall motion. Normal LV systolic function with EF 58%. Doppler evidence of grade I (impaired) diastolic dysfunction, normal LAP.  The aortic root is dilated, maximal dimension 4.4 cm at sinotubular junction.  Structurally normal trileaflet aortic valve.  Mild (Grade I) aortic regurgitation.  Mild to moderate mitral regurgitation.  Normal right atrial pressure.  Stress Testing: Lexiscan Nuclear stress test 04/13/2022: Nondiagnostic ECG stress. The heart rate response was consistent with Regadenoson.   Myocardial perfusion is abnormal.  Mild degree large extent perfusion defect consistent with moderate ischemia located in the apical anterior wall, apical lateral wall, mid inferolateral wall and basal inferolateral wall (Left Anterior Descending Artery and Left Circumflex Artery regions) of left ventricle. LV is dilated in both rest and stress, more dilated in stress although calculated TID index is normal. Overall LV systolic function is abnormal with inferior and inferolateral akinesis and global hypokinesis.   Stress LVEF mildly reduced at LV EF: 42%.  No previous exam available for comparison. High risk and multivessel disease cannot be excluded.  Heart Catheterization: Left Heart Catheterization 05/19/22:  LV: Not performed. LM: Large caliber vessel, bifurcates into very very large circumflex and LAD. CX: Marked generalized arteriomegaly noted, appeared to measure at least 8 to 9 mm in size in the proximal and mid segment with severe tortuosity.  Proximal circumflex has at most a 20 to 30% stenosis.  Slow flow is evident throughout the circumflex coronary artery.  It is a dominant vessel and gives origin to 2 large marginals and PDA branches. LAD: Large vessel.  Generalized arteriomegaly noted again.  Gives origin to large D2.  Small D1.  No significant disease. RCA: Appears very small and nondominant.  Unable to cannulate the vessel in view of large body size of the patient and catheter is unable to reach and also using long catheters, still unable to engage the right coronary artery.  Eventually procedure abandoned due to severe spasm noted in the subclavian and innominate arteries with difficulty to talk to devices.  Recommendation: His nuclear stress is abnormal due to endothelial dysfunction.  There is clearly severe tortuosity of a very large and dominant circumflex coronary artery with slow flow.  Suspect endothelial dysfunction.  If high suspicion for CAD, could consider coronary CTA.   But the right coronary appears small.  Nonselective injection.  Smoking cessation, optimization of medical therapy for hypertension and hyperlipidemia and weight loss would be appropriate.  From surgical standpoint, he can be taken up for the upcoming surgery with low risk.  130 mL contrast utilized.   LABORATORY DATA: External Labs: Collected: December 24, 2021 available in Care Everywhere, [facility] health Hemoglobin A1c 6.5 Sodium 137, potassium 4.4, chloride 105, bicarb 27.5. BUN 11, creatinine 0.67. eGFR >60. AST 20, ALT 17, alkaline phosphatase 60 Total cholesterol 175, triglycerides 69, HDL 66, LDL 95, non-HDL 109  IMPRESSION:    ICD-10-CM   1. Preop cardiovascular exam  Z01.810 EKG 12-Lead    2. Abnormal nuclear cardiac imaging test  R93.1 nitroGLYCERIN (NITROSTAT) 0.4 MG SL tablet    3. Status post left heart catheterization  Z98.890     4. Shortness of breath  R06.02     5. Benign hypertension  I10     6. Aortic  root dilatation (HCC)  I77.810 CT CHEST WO CONTRAST    7. Non-insulin dependent type 2 diabetes mellitus ([place])  E11.9     8. Mixed hyperlipidemia  E78.2     9. History of deep venous thrombosis  Z86.718     10. Former smoker  Z87.891     4. Chronic obstructive pulmonary disease, unspecified COPD type ([place])  J44.9         RECOMMENDATIONS: Brent Quackenbush. is a 66 y.o. African-American male whose past medical history and cardiac risk factors include: Hypertension, COPD, hyperlipidemia, smoker, Hx alcohol abuse, history of DVT, history of stroke with left foot drop, non-insulin-dependent diabetes.  Preop cardiovascular exam Planning to undergo left knee scope with Brent Byrd, date to be determined, type of anesthesia to be determined. Due to poor functional capacity, dyspnea, multiple cardiovascular risk factors including history of stroke and diabetes that shared decision was to proceed with ischemic work-up for further risk stratification. EKG: Sinus rhythm  with diffuse nonspecific T wave abnormality. Echocardiogram: LVEF 32%, grade 1 diastolic impairment, mild AR, mild to moderate MR. MPI: High risk suggestive of reversible ischemia. Left heart catheterization: Ectatic vessels of the left coronary system without any obstructive CAD.  RCA nonselective injection appears to be small and nondominant. From a cardiovascular standpoint patient will be considered low risk for upcoming orthopedic procedure. We will defer anticoagulation management to his prescribing physician. Would also recommend pulmonary clearance prior to upcoming elective procedure.  Abnormal nuclear cardiac imaging test Denies angina pectoris. Continue aspirin and statin therapy. Reemphasized the importance of improving his modifiable cardiovascular risk factors. Congratulated on his efforts of complete alcohol cessation as of 05/29/2022. We will prescribe sublingual nitroglycerin tablets to use on a as needed basis.  Medication profile including the risks/benefits/limitations discussed.  Shortness of breath Likely secondary to 10 pound weight gain since establishing care, strong history of smoking, underlying COPD. Has undergone ischemic work-up as outlined above. We will defer noncardiac causes of dyspnea to PCP for now.  Benign hypertension Office blood pressures are within acceptable limits. Recommended that he take hydrochlorothiazide in the morning and amlodipine at night. Reemphasized the importance of low-salt diet. Rest will defer to PCP  Aortic root dilatation (HCC) Asymptomatic. Aortic root reported to be 44 mm at the sinotubular junction on recent echo from June 2023. Reemphasized importance of blood pressure management. We will order CT of the chest without contrast in 1 year to reevaluate disease progression  Non-insulin dependent type 2 diabetes mellitus ([place]) Reemphasized importance of glycemic control. Recommend initiation of either ACE/ARB in the setting  of diabetes for renal protection-defer to primary team. Continue statin therapy  Mixed hyperlipidemia For reasons unknown patient was on both Lipitor and Crestor. We will continue with Crestor going forward. Does not endorse myalgias.  History of deep venous thrombosis Currently on oral anticoagulation. Currently managed by other providers and his care team  Former smoker Quit 05/29/2022. He is congratulated on his efforts and educated her on the importance of continued complete smoking cessation. Patient scheduled for lung cancer screening as recommended by pulmonary medicine, agree.  Chronic obstructive pulmonary disease, unspecified COPD type ([place]) Management per pulmonary team.   FINAL MEDICATION LIST END OF ENCOUNTER: Meds ordered this encounter  Medications   nitroGLYCERIN (NITROSTAT) 0.4 MG SL tablet    Sig: Place 1 tablet (0.4 mg total) under the tongue every 5 (five) minutes as needed for chest pain. If you require more than two tablets five minutes apart  go to the nearest ER via EMS.    Dispense:  30 tablet    Refill:  0    Medications Discontinued During This Encounter  Medication Reason   albuterol (VENTOLIN HFA) 108 (90 Base) MCG/ACT inhaler Change in therapy   predniSONE (DELTASONE) 50 MG tablet    atorvastatin (LIPITOR) 40 MG tablet Duplicate   nitroGLYCERIN (NITROSTAT) 0.4 MG SL tablet Dose change      Current Outpatient Medications:    amLODipine (NORVASC) 5 MG tablet, Take 5 mg by mouth daily., Disp: , Rfl:    aspirin EC 81 MG tablet, Take 1 tablet (81 mg total) by mouth daily. Swallow whole., Disp: 30 tablet, Rfl: 12   Budeson-Glycopyrrol-Formoterol (BREZTRI AEROSPHERE) 160-9-4.8 MCG/ACT AERO, Inhale into the lungs., Disp: , Rfl:    ELIQUIS 5 MG TABS tablet, Take 5 mg by mouth 2 (two) times daily., Disp: , Rfl:    hydrochlorothiazide (MICROZIDE) 12.5 MG capsule, Take 1 capsule (12.5 mg total) by mouth daily., Disp: 90 capsule, Rfl: 0   metFORMIN  (GLUCOPHAGE) 500 MG tablet, Take 500 mg by mouth 2 (two) times daily with a meal., Disp: , Rfl:    nitroGLYCERIN (NITROSTAT) 0.4 MG SL tablet, Place 1 tablet (0.4 mg total) under the tongue every 5 (five) minutes as needed for chest pain. If you require more than two tablets five minutes apart go to the nearest ER via EMS., Disp: 30 tablet, Rfl: 0   omeprazole (PRILOSEC) 20 MG capsule, Take 20 mg by mouth daily., Disp: , Rfl:    rosuvastatin (CRESTOR) 20 MG tablet, Take 1 tablet (20 mg total) by mouth at bedtime., Disp: 90 tablet, Rfl: 0   varenicline (CHANTIX) 0.5 MG tablet, Take 0.5 mg by mouth as directed., Disp: , Rfl:   Orders Placed This Encounter  Procedures   CT CHEST WO CONTRAST   EKG 12-Lead    There are no Patient Instructions on file for this visit.   --Continue cardiac medications as reconciled in final medication list. --Return in about 6 months (around 12/09/2022) for Follow up. or sooner if needed. --Continue follow-up with your primary care physician regarding the management of your other chronic comorbid conditions.  Patient's questions and concerns were addressed to his satisfaction. He voices understanding of the instructions provided during this encounter.   This note was created using a voice recognition software as a result there may be grammatical errors inadvertently enclosed that do not reflect the nature of this encounter. Every attempt is made to correct such errors.  Brent Byrd, [place], [facility]  Pager: [phone] Office: [phone]

## 2022-06-14 ENCOUNTER — Encounter: Payer: Self-pay | Admitting: Cardiology

## 2022-07-08 ENCOUNTER — Other Ambulatory Visit: Payer: Self-pay | Admitting: Urology

## 2022-07-16 ENCOUNTER — Encounter: Payer: Self-pay | Admitting: Cardiology

## 2022-07-20 NOTE — Patient Instructions (Addendum)
SURGICAL WAITING ROOM VISITATION Patients having surgery or a procedure may have no more than 2 support people in the waiting area - these visitors may rotate in the visitor waiting room.   Children under the age of 76 must have an adult with them who is not the patient. If the patient needs to stay at the hospital during part of their recovery, the visitor guidelines for inpatient rooms apply.  PRE-OP VISITATION  Pre-op nurse will coordinate an appropriate time for 1 support person to accompany the patient in pre-op.  This support person may not rotate.  This visitor will be contacted when the time is appropriate for the visitor to come back in the pre-op area.  Please refer to the [facility] website for the visitor guidelines for Inpatients (after your surgery is over and you are in a regular room).  You are not required to quarantine at this time prior to your surgery. However, you must do this: Hand Hygiene often Do NOT share personal items Notify your provider if you are in close contact with someone who has COVID or you develop fever 100.4 or greater, new onset of sneezing, cough, sore throat, shortness of breath or body aches.   If you received a COVID test during your pre-op visit  it is requested that you wear a mask when out in public, stay away from anyone that may not be feeling well and notify your surgeon if you develop symptoms. If you test positive for Covid or have been in contact with anyone that has tested positive in the last 10 days please notify you surgeon.       Your procedure is scheduled on:  Tuesday August 04, 2022  Report to [facility] Main Entrance.  Report to admitting at: 12:15  PM  +++++Call this number if you have any questions or problems the morning of surgery [phone]  Do not eat food or drink After Midnight the night prior to your surgery/procedure.             FOLLOW BOWEL PREP AND ANY ADDITIONAL PRE OP INSTRUCTIONS YOU RECEIVED  FROM YOUR SURGEON'S OFFICE!!!   Oral Hygiene is also important to reduce your risk of infection.        Remember - BRUSH YOUR TEETH THE MORNING OF SURGERY WITH YOUR REGULAR TOOTHPASTE  Do NOT smoke after Midnight the night before surgery.  METFORMIN-  Take it as usual on the day before surgery (Monday 08-03-22). DO NOT TAKE Metformin on the morning of your surgery.   Take ONLY these medicines the morning of surgery with A SIP OF WATER: amlodipine, omeprazole   Bring CPAP mask and tubing day of surgery.                   You may not have any metal on your body including  jewelry, and body piercing  Do not wear  lotions, powders, cologne, or deodorant     Men may shave face and neck.  Contacts, Hearing Aids, dentures or bridgework may not be worn into surgery.   You may bring a small overnight bag with you on the day of surgery, only pack items that are not valuable .[facility] IS NOT RESPONSIBLE   FOR VALUABLES THAT ARE LOST OR STOLEN.   DO NOT [place]. PHARMACY WILL DISPENSE MEDICATIONS LISTED ON YOUR MEDICATION LIST TO YOU DURING YOUR ADMISSION [facility]!    Special Instructions: Bring a  copy of your healthcare power of attorney and living will documents the day of surgery, if you wish to have them scanned into your [facility] Records- EPIC  Please read over the following fact sheets you were given: IF YOU HAVE QUESTIONS ABOUT YOUR PRE-OP INSTRUCTIONS, PLEASE CALL [phone]  ([place])   [facility] - Preparing for Surgery Before surgery, you can play an important role.  Because skin is not sterile, your skin needs to be as free of germs as possible.  You can reduce the number of germs on your skin by washing with CHG (chlorahexidine gluconate) soap before surgery.  CHG is an antiseptic cleaner which kills germs and bonds with the skin to continue killing germs even after washing. Please DO NOT use if you have an allergy to CHG or  antibacterial soaps.  If your skin becomes reddened/irritated stop using the CHG and inform your nurse when you arrive at Short Stay. Do not shave (including legs and underarms) for at least 48 hours prior to the first CHG shower.  You may shave your face/neck.  Please follow these instructions carefully:  1.  Shower with CHG Soap the night before surgery and the  morning of surgery.  2.  If you choose to wash your hair, wash your hair first as usual with your normal  shampoo.  3.  After you shampoo, rinse your hair and body thoroughly to remove the shampoo.                             4.  Use CHG as you would any other liquid soap.  You can apply chg directly to the skin and wash.  Gently with a scrungie or clean washcloth.  5.  Apply the CHG Soap to your body ONLY FROM THE NECK DOWN.   Do not use on face/ open                           Wound or open sores. Avoid contact with eyes, ears mouth and genitals (private parts).                       Wash face,  Genitals (private parts) with your normal soap.             6.  Wash thoroughly, paying special attention to the area where your  surgery  will be performed.  7.  Thoroughly rinse your body with warm water from the neck down.  8.  DO NOT shower/wash with your normal soap after using and rinsing off the CHG Soap.            9.  Pat yourself dry with a clean towel.            10.  Wear clean pajamas.            11.  Place clean sheets on your bed the night of your first shower and do not  sleep with pets.  ON THE DAY OF SURGERY : Do not apply any lotions/deodorants the morning of surgery.  Please wear clean clothes to the hospital/surgery center.    FAILURE TO FOLLOW THESE INSTRUCTIONS MAY RESULT IN THE CANCELLATION OF YOUR SURGERY  PATIENT SIGNATURE_________________________________  NURSE SIGNATURE__________________________________  ________________________________________________________________________

## 2022-07-20 NOTE — Progress Notes (Signed)
COVID Vaccine received:  '[]'$  No '[x]'$  Yes Date of any COVID positive Test in last 90 days:  PCP - Nolene Ebbs, MD  Cardiologist - [name], MD Pulmonolgy- Durenda Hurt, MD [facility]   [address]   [place], [place] [address]   [phone]    ENT- Unice Bailey, MD [facility]   [address]   [place],  [address]   [phone]    Chest x-ray - 12-2019  Epic EKG -  06-08-2022  Epic Stress Test - 04-13-2022  Epic ECHO - 04-06-2022  Epic Cardiac Cath - 05-19-2022  Epic  Dr. Einar Gip  Pacemaker/ICD device     '[]'$  N/A Spinal Cord Stimulator:'[]'$  No '[]'$  Yes      (Remind patient to bring remote DOS) Other Implants:   Bowel Prep -   History of Sleep Apnea? '[]'$  No '[]'$  Yes   Sleep Study Date:   CPAP used?- '[]'$  No '[]'$  Yes  (Instruct to bring their mask & Tubing)  Does the patient monitor blood sugar? '[]'$  No '[]'$  Yes  '[]'$  N/A Does patient have a [employer] or Dexacom? '[]'$  No '[]'$  Yes   Fasting Blood Sugar Ranges-  Checks Blood Sugar _____ times a day  Blood Thinner Instructions: Aspirin Instructions: Last Dose:  ERAS Protocol Ordered: '[]'$  No  '[]'$  Yes PRE-SURGERY '[]'$  ENSURE  '[]'$  G2   Comments:   Activity level: Patient can / can not climb a flight of stairs without difficulty;  '[]'$  No CP  '[]'$  No SOB,  but would have ______   Anesthesia review:   Patient denies shortness of breath, fever, cough and chest pain at PAT appointment.  Patient verbalized understanding and agreement to the Pre-Surgical Instructions that were given to them at this PAT appointment. Patient was also educated of the need to review these PAT instructions again prior to his/her surgery.I reviewed the appropriate phone numbers to call if they have any and questions or concerns.

## 2022-07-22 ENCOUNTER — Encounter (HOSPITAL_COMMUNITY): Payer: Self-pay

## 2022-07-22 ENCOUNTER — Encounter (HOSPITAL_COMMUNITY)
Admission: RE | Admit: 2022-07-22 | Discharge: 2022-07-22 | Disposition: A | Discharge status: Home / Self Care | Payer: Medicaid Other | Source: Ambulatory Visit | Attending: Anesthesiology | Admitting: Anesthesiology

## 2022-07-22 DIAGNOSIS — R7303 Prediabetes: Secondary | ICD-10-CM

## 2022-07-22 DIAGNOSIS — F1910 Other psychoactive substance abuse, uncomplicated: Secondary | ICD-10-CM

## 2022-07-22 DIAGNOSIS — I10 Essential (primary) hypertension: Secondary | ICD-10-CM

## 2022-08-04 ENCOUNTER — Ambulatory Visit: Admit: 2022-08-04 | Payer: Medicaid Other | Admitting: Urology

## 2022-08-04 ENCOUNTER — Other Ambulatory Visit: Payer: Self-pay | Admitting: Cardiology

## 2022-08-04 DIAGNOSIS — E119 Type 2 diabetes mellitus without complications: Secondary | ICD-10-CM

## 2022-08-04 DIAGNOSIS — R931 Abnormal findings on diagnostic imaging of heart and coronary circulation: Secondary | ICD-10-CM

## 2022-08-04 SURGERY — TURP (TRANSURETHRAL RESECTION OF PROSTATE)
Anesthesia: General

## 2022-08-13 ENCOUNTER — Other Ambulatory Visit: Payer: Self-pay | Admitting: Cardiology

## 2022-08-13 DIAGNOSIS — I10 Essential (primary) hypertension: Secondary | ICD-10-CM

## 2022-08-13 DIAGNOSIS — R0602 Shortness of breath: Secondary | ICD-10-CM

## 2022-08-19 ENCOUNTER — Other Ambulatory Visit: Payer: Self-pay | Admitting: Urology

## 2022-08-28 NOTE — Patient Instructions (Addendum)
DUE TO COVID-19 ONLY TWO VISITORS  (aged 66 and older)  ARE ALLOWED TO COME WITH YOU AND STAY IN THE WAITING ROOM ONLY DURING PRE OP AND PROCEDURE.   **NO VISITORS ARE ALLOWED IN THE SHORT STAY AREA OR RECOVERY ROOM!!**  IF YOU WILL BE ADMITTED INTO THE HOSPITAL YOU ARE ALLOWED ONLY FOUR SUPPORT PEOPLE DURING VISITATION HOURS ONLY (7 AM -8PM)   The support person(s) must pass our screening, gel in and out, and wear a mask at all times, including in the patient's room. Patients must also wear a mask when staff or their support person are in the room. Visitors GUEST BADGE MUST BE WORN VISIBLY  One adult visitor may remain with you overnight and MUST be in the room by 8 P.M.     Your procedure is scheduled on: 09/01/22   Report to [facility] Main Entrance    Report to admitting at 5:15 AM   Call this number if you have problems the morning of surgery [phone]   Do not eat food or drink:After Midnight.          If you have questions, please contact your surgeon's office.     Before surgery.Stop taking __Eliquis_________on __11/4________as instructed by _____________.  Stop taking _ASA ___________as directed by your Surgeon/Cardiologist.        Oral Hygiene is also important to reduce your risk of infection.                                    Remember - BRUSH YOUR TEETH THE MORNING OF SURGERY WITH YOUR REGULAR TOOTHPASTE   Do NOT smoke after Midnight   Take these medicines the morning of surgery with A SIP OF WATER: Amlodipine-Norvasc                                                                                                                           Omeprazole-Prilosec                                                                                                                            Use your inhaler and bring it with you  DO NOT TAKE ANY ORAL DIABETIC MEDICATIONS DAY OF YOUR SURGERY ([place])  You may not have any metal  on your body including  jewelry, and body piercing             Do not wear lotions, powders, cologne, or deodorant                Men may shave face and neck.   Do not bring valuables to the hospital. [place].   Contacts, dentures or bridgework may not be worn into surgery.   Bring small overnight bag day of surgery.   DO NOT [place]. PHARMACY WILL DISPENSE MEDICATIONS LISTED ON YOUR MEDICATION LIST TO YOU DURING YOUR ADMISSION [place]!       Special Instructions: Bring a copy of your healthcare power of attorney and living will documents   the day of surgery if you haven't scanned them before.              Please read over the following fact sheets you were given: IF YOU HAVE QUESTIONS ABOUT YOUR PRE-OP INSTRUCTIONS PLEASE CALL [phone]    [facility] - Preparing for Surgery Before surgery, you can play an important role.  Because skin is not sterile, your skin needs to be as free of germs as possible.  You can reduce the number of germs on your skin by washing with CHG (chlorahexidine gluconate) soap before surgery.  CHG is an antiseptic cleaner which kills germs and bonds with the skin to continue killing germs even after washing. Please DO NOT use if you have an allergy to CHG or antibacterial soaps.  If your skin becomes reddened/irritated stop using the CHG and inform your nurse when you arrive at Short Stay. Do not shave (including legs and underarms) for at least 48 hours prior to the first CHG shower.  You may shave your face/neck. Please follow these instructions carefully:  1.  Shower with CHG Soap the night before surgery and the  morning of Surgery.  2.  If you choose to wash your hair, wash your hair first as usual with your  normal  shampoo.  3.  After you shampoo, rinse your hair and body thoroughly to remove the  shampoo.                            4.  Use CHG as you  would any other liquid soap.  You can apply chg directly  to the skin and wash                       Gently with a scrungie or clean washcloth.  5.  Apply the CHG Soap to your body ONLY FROM THE NECK DOWN.   Do not use on face/ open                           Wound or open sores. Avoid contact with eyes, ears mouth and genitals (private parts).                       Wash face,  Genitals (private parts) with your normal soap.             6.  Wash thoroughly, paying special attention to the area where your surgery  will be  performed.  7.  Thoroughly rinse your body with warm water from the neck down.  8.  DO NOT shower/wash with your normal soap after using and rinsing off  the CHG Soap.                9.  Pat yourself dry with a clean towel.            10.  Wear clean pajamas.            11.  Place clean sheets on your bed the night of your first shower and do not  sleep with pets. Day of Surgery : Do not apply any lotions/deodorants the morning of surgery.  Please wear clean clothes to the hospital/surgery center.  FAILURE TO FOLLOW THESE INSTRUCTIONS MAY RESULT IN THE CANCELLATION OF YOUR SURGERY    ________________________________________________________________________

## 2022-08-31 ENCOUNTER — Encounter (HOSPITAL_COMMUNITY)
Admission: RE | Admit: 2022-08-31 | Discharge: 2022-08-31 | Disposition: A | Discharge status: Home / Self Care | Payer: Medicaid Other | Source: Ambulatory Visit | Attending: Urology | Admitting: Urology

## 2022-08-31 ENCOUNTER — Encounter (HOSPITAL_COMMUNITY): Payer: Self-pay

## 2022-08-31 ENCOUNTER — Other Ambulatory Visit: Payer: Self-pay

## 2022-08-31 ENCOUNTER — Encounter (HOSPITAL_COMMUNITY): Payer: Self-pay | Admitting: Physician Assistant

## 2022-08-31 DIAGNOSIS — I10 Essential (primary) hypertension: Secondary | ICD-10-CM | POA: Diagnosis not present

## 2022-08-31 DIAGNOSIS — N400 Enlarged prostate without lower urinary tract symptoms: Secondary | ICD-10-CM | POA: Insufficient documentation

## 2022-08-31 DIAGNOSIS — Z720 Tobacco use: Secondary | ICD-10-CM | POA: Insufficient documentation

## 2022-08-31 DIAGNOSIS — R7303 Prediabetes: Secondary | ICD-10-CM | POA: Insufficient documentation

## 2022-08-31 DIAGNOSIS — Z01812 Encounter for preprocedural laboratory examination: Secondary | ICD-10-CM | POA: Diagnosis present

## 2022-08-31 DIAGNOSIS — Z8673 Personal history of transient ischemic attack (TIA), and cerebral infarction without residual deficits: Secondary | ICD-10-CM | POA: Diagnosis not present

## 2022-08-31 DIAGNOSIS — J449 Chronic obstructive pulmonary disease, unspecified: Secondary | ICD-10-CM | POA: Diagnosis not present

## 2022-08-31 LAB — BASIC METABOLIC PANEL
Anion gap: 6 (ref 5–15)
BUN: 11 mg/dL (ref 8–23)
CO2: 27 mmol/L (ref 22–32)
Calcium: 9.2 mg/dL (ref 8.9–10.3)
Chloride: 103 mmol/L (ref 98–111)
Creatinine, Ser: 0.96 mg/dL (ref 0.61–1.24)
GFR, Estimated: >60 mL/min (ref >60)
Glucose, Bld: 182 mg/dL — ABNORMAL HIGH (ref 70–99)
Potassium: 3.9 mmol/L (ref 3.5–5.1)
Sodium: 136 mmol/L (ref 135–145)

## 2022-08-31 LAB — CBC
HCT: 39.4 % (ref 39.0–52.0)
Hemoglobin: 12.8 g/dL — ABNORMAL LOW (ref 13.0–17.0)
MCH: 29 pg (ref 26.0–34.0)
MCHC: 32.5 g/dL (ref 30.0–36.0)
MCV: 89.3 fL (ref 80.0–100.0)
Platelets: 248 10*3/uL (ref 150–400)
RBC: 4.41 MIL/uL (ref 4.22–5.81)
RDW: 15 % (ref 11.5–15.5)
WBC: 5.1 10*3/uL (ref 4.0–10.5)
nRBC: 0 % (ref 0.0–0.2)

## 2022-08-31 LAB — GLUCOSE, CAPILLARY: Glucose-Capillary: 195 mg/dL — ABNORMAL HIGH (ref 70–99)

## 2022-08-31 LAB — HEMOGLOBIN A1C
Hgb A1c MFr Bld: 8.8 % — ABNORMAL HIGH (ref 4.8–5.6)
Mean Plasma Glucose: 205.86 mg/dL

## 2022-08-31 NOTE — Progress Notes (Signed)
Anesthesia Chart Review   Case: [id number] Date/Time: 09/01/22 0715   Procedure: TRANSURETHRAL RESECTION OF THE PROSTATE (TURP)   Anesthesia type: General   Pre-op diagnosis: BENIGN PROSTATIC HYPERTROPHY   Location: [facility] ROOM 04 / [facility]   Surgeons: Robley Fries, MD       DISCUSSION:66 y.o. smoker with h/o HTN, COPD, stroke, BPH scheduled for above procedure 09/01/2022 with Dr. Jacalyn Lefevre.   Pt last seen by cardiology 06/08/2022. Per OV note, "Preop cardiovascular exam Planning to undergo left knee scope with Dr. Mardelle Matte, date to be determined, type of anesthesia to be determined. Due to poor functional capacity, dyspnea, multiple cardiovascular risk factors including history of stroke and diabetes that shared decision was to proceed with ischemic work-up for further risk stratification. EKG: Sinus rhythm with diffuse nonspecific T wave abnormality. Echocardiogram: LVEF 41%, grade 1 diastolic impairment, mild AR, mild to moderate MR. MPI: High risk suggestive of reversible ischemia. Left heart catheterization: Ectatic vessels of the left coronary system without any obstructive CAD.  RCA nonselective injection appears to be small and nondominant. From a cardiovascular standpoint patient will be considered low risk for upcoming orthopedic procedure. We will defer anticoagulation management to his prescribing physician. Would also recommend pulmonary clearance prior to upcoming elective procedure."  Pt seen by pulmonology 05/11/2022. Per OV note recommended chest CT, neck CT and LHC prior to surgery.   Chest CT 06/13/2022 1.A 4 mm subpleural nodule in the lingula.  2.Cardiomegaly with pulmonary artery enlargement measuring 3.9 cm in  diameter. Calcifications in the basilar left lower lobe segmental and  subsegmental pulmonary arteries concerning for chronic pulmonary emboli.  Findings could represent chronic thromboembolic pulmonary hypertension or  other types of pulmonary  hypertension.  3.Ascending aorta is dilated measuring 4.7 cm in diameter. Recommend  annual surveillance.   Echo ordered for evaluation of pulmonary HTN, this has not been completed.  He is also scheduled for bronchoscopy and biopsy.   Pt reports he was advised to hold Eliquis 4 days prior to procedure.   Pt seen by ENT 06/23/2022. Per Note, pt has mild to moderate pharyngeal inflammation, mild let vocal fold hypomobility, left vocal fold sulcus, glottic insufficiency. Follow up as needed recommended.   VS: There were no vitals taken for this visit.  PROVIDERS: Nolene Ebbs, MD is PCP   Rex Kras, DO is Cardiologist  LABS: Labs reviewed: Acceptable for surgery. (all labs ordered are listed, but only abnormal results are displayed)  Labs Reviewed - No data to display   IMAGES: CT Chest 06/13/2022 RECOMMENDATIONS:  -Continue annual screening with low dose CT chest in 12 months.  -Cardiomegaly with pulmonary artery enlargement measuring 3.9 cm with  calcifications in the basilar segmental and subsegmental pulmonary  arteries concerning for chronic pulmonary emboli. Findings may represent  chronic thromboembolic pulmonary hypertension or other pulmonary  hypertension.  -Ascending aorta is dilated measuring 4.7 cm in diameter. Recommend annual  surveillance.   EKG:   CV:  Left Heart Catheterization 05/19/22:  LV: Not performed. LM: Large caliber vessel, bifurcates into very very large circumflex and LAD. CX: Marked generalized arteriomegaly noted, appeared to measure at least 8 to 9 mm in size in the proximal and mid segment with severe tortuosity.  Proximal circumflex has at most a 20 to 30% stenosis.  Slow flow is evident throughout the circumflex coronary artery.  It is a dominant vessel and gives origin to 2 large marginals and PDA branches. LAD: Large vessel.  Generalized arteriomegaly noted  again.  Gives origin to large D2.  Small D1.  No significant disease. RCA:  Appears very small and nondominant.  Unable to cannulate the vessel in view of large body size of the patient and catheter is unable to reach and also using long catheters, still unable to engage the right coronary artery.  Eventually procedure abandoned due to severe spasm noted in the subclavian and innominate arteries with difficulty to talk to devices.  Recommendation: His nuclear stress is abnormal due to endothelial dysfunction.  There is clearly severe tortuosity of a very large and dominant circumflex coronary artery with slow flow.  Suspect endothelial dysfunction.  If high suspicion for CAD, could consider coronary CTA.  But the right coronary appears small.  Nonselective injection.  Smoking cessation, optimization of medical therapy for hypertension and hyperlipidemia and weight loss would be appropriate.  From surgical standpoint, he can be taken up for the upcoming surgery with low risk.  130 mL contrast utilized.   Echocardiogram 04/06/2022:  Left ventricle cavity is normal in size. Mild concentric hypertrophy of  the left ventricle. Normal global wall motion. Normal LV systolic function  with EF 58%. Doppler evidence of grade I (impaired) diastolic dysfunction,  normal LAP.  The aortic root is dilated, maximal dimension 4.4 cm at sinotubular  junction.  Structurally normal trileaflet aortic valve.  Mild (Grade I) aortic  regurgitation.  Mild to moderate mitral regurgitation.  Normal right atrial pressure.   Echo 10/26/2016 - Left ventricle: The cavity size was normal. Wall thickness was    increased in a pattern of moderate to severe LVH. Systolic    function was normal. The estimated ejection fraction was in the    range of 55% to 60%. Wall motion was normal; there were no    regional wall motion abnormalities. Doppler parameters are    consistent with abnormal left ventricular relaxation (grade 1    diastolic dysfunction).  - Aortic valve: There was mild regurgitation.  -  Aortic root: The aortic root was moderately dilated.  - Left atrium: The atrium was moderately to severely dilated.  Past Medical History:  Diagnosis Date   Alcohol abuse    Arthritis    Left knee, lower back   Chest pain    hx   COPD (chronic obstructive pulmonary disease) ([place])    "[place] said I don't have this" (10/26/2016)   Depression    no meds   DVT (deep venous thrombosis) ([place]) 2008   Right leg- txed with coumadin    DVT (deep venous thrombosis) ([place]) 2010; 2017   RLE; RLE   Gastroesophageal reflux disease 2014   Hypertension     per patiente-controlled, was taken off BP med   MI, old ~ 2007   Stroke ([place]) 01/2012   denies residual on 10/26/2016   Substance abuse ([place])    Alcohol dependence Hx   Tobacco abuse     Past Surgical History:  Procedure Laterality Date   GANGLION CYST EXCISION Left 2000   wrist   HAMMER TOE SURGERY Left 07/2012   2nd toe/08/05/2012   LEFT HEART CATH AND CORONARY ANGIOGRAPHY N/A 05/19/2022   Procedure: LEFT HEART CATH AND CORONARY ANGIOGRAPHY;  Surgeon: [name], MD;  Location: [facility];  Service: Cardiovascular;  Laterality: N/A;   LUMBAR DISC SURGERY  06/2007   Herniated disc/notes 02/25/2011   WISDOM TOOTH EXTRACTION      MEDICATIONS:  amLODipine (NORVASC) 5 MG tablet   aspirin EC 81  MG tablet   Budeson-Glycopyrrol-Formoterol (BREZTRI AEROSPHERE) 160-9-4.8 MCG/ACT AERO   diclofenac Sodium (VOLTAREN ARTHRITIS PAIN) 1 % GEL   ELIQUIS 5 MG TABS tablet   hydrochlorothiazide (MICROZIDE) 12.5 MG capsule   metFORMIN (GLUCOPHAGE) 500 MG tablet   nitroGLYCERIN (NITROSTAT) 0.4 MG SL tablet   omeprazole (PRILOSEC) 20 MG capsule   rosuvastatin (CRESTOR) 20 MG tablet   No current facility-administered medications for this encounter.    Konrad Felix Ward, PA-C [facility]-Surgical [facility]

## 2022-08-31 NOTE — Progress Notes (Addendum)
Pt missed his PAT visit. He was on his way to another appointment and was in the car. I did the interview but felt that he couldn't concentrate while driving for the instructions. He said he was coming to [place] long for labs and instructions 2 hours later. After 10:30 I started calling him with no answer. I called his daughter's number to leave instructions and left a message on his home phone as well. I repeated a round of calling again at 1:00 PM.  Pt arrived to the hospital for PAT visit in the afternoon.

## 2022-08-31 NOTE — Progress Notes (Addendum)
Anesthesia note:  Bowel prep reminder:  NA  PCP - Dr. Arnaldo Natal Cardiologist -Dr. Christen Butter Other-   Chest x-ray - no EKG - 06/08/22-epic Stress Test - 04/13/22-epic ECHO - 04/06/22-epic Cardiac Cath - 05/19/22-epic CABG-no Pacemaker/ICD device last checked:NA  Sleep Study - no CPAP -    CBG at PAT visit-195 Fasting Blood Sugar at home-126-290 Checks Blood Sugar _QD____  Blood Thinner:Eliquis Blood Thinner Instructions:stop 4 days prior to DOS Aspirin Instructions:stop 4 days prior to DOS Last Dose:11/2  Anesthesia review: Yes  reason: Diabetic and cardiac  Patient denies shortness of breath, fever, cough and chest pain at PAT appointment.Pt reports no SOB .he is a smoker and O2 sat was 92% at PAT visit    Patient verbalized understanding of instructions that were given to them at the PAT appointment. Patient was also instructed that they will need to review over the PAT instructions again at home before surgery.yes

## 2022-09-01 ENCOUNTER — Encounter (HOSPITAL_COMMUNITY): Admission: RE | Payer: Self-pay | Source: Home / Self Care

## 2022-09-01 ENCOUNTER — Ambulatory Visit (HOSPITAL_COMMUNITY): Admission: RE | Admit: 2022-09-01 | Payer: Medicaid Other | Source: Home / Self Care | Admitting: Urology

## 2022-09-01 DIAGNOSIS — R7303 Prediabetes: Secondary | ICD-10-CM

## 2022-09-01 SURGERY — TURP (TRANSURETHRAL RESECTION OF PROSTATE)
Anesthesia: General

## 2022-09-24 NOTE — Progress Notes (Signed)
Sent message, via epic in basket, requesting orders in epic from surgeon.  

## 2022-09-28 ENCOUNTER — Ambulatory Visit: Payer: Medicaid Other | Admitting: Cardiology

## 2022-09-29 ENCOUNTER — Other Ambulatory Visit: Payer: Self-pay | Admitting: Urology

## 2022-09-29 NOTE — Progress Notes (Signed)
Second request for pre op orders: Left a voicemail for [employer].

## 2022-09-30 NOTE — Progress Notes (Signed)
COVID Vaccine Completed: yes  Date of COVID positive in last 90 days:  PCP - Nolene Ebbs, MD Cardiologist - Rex Kras, DO  Chest x-ray - lung scan 06/30/22 [facility] EKG - 06/08/22 Epic Stress Test - 04/13/22 Epic ECHO - 08/31/22 [facility] Cardiac Cath - 05/19/22 Epic Pacemaker/ICD device last checked: Spinal Cord Stimulator:  Bowel Prep -   Sleep Study -  CPAP -   Fasting Blood Sugar -  Checks Blood Sugar _____ times a day  Last dose of GLP1 agonist-  N/A GLP1 instructions:  N/A   Last dose of SGLT-2 inhibitors-  N/A SGLT-2 instructions: N/A   Blood Thinner Instructions: Eliquis Aspirin Instructions: ASA 81 Last Dose:  Activity level:  Can go up a flight of stairs and perform activities of daily living without stopping and without symptoms of chest pain or shortness of breath.  Able to exercise without symptoms  Unable to go up a flight of stairs without symptoms of     Anesthesia review: HTN, COPD, DVT, MI, stroke, DM2, needs cardiac clearance   Patient denies shortness of breath, fever, cough and chest pain at PAT appointment  Patient verbalized understanding of instructions that were given to them at the PAT appointment. Patient was also instructed that they will need to review over the PAT instructions again at home before surgery.

## 2022-09-30 NOTE — Patient Instructions (Signed)
SURGICAL WAITING ROOM VISITATION Patients having surgery or a procedure may have no more than 2 support people in the waiting area - these visitors may rotate.   Children under the age of 72 must have an adult with them who is not the patient. If the patient needs to stay at the hospital during part of their recovery, the visitor guidelines for inpatient rooms apply. Pre-op nurse will coordinate an appropriate time for 1 support person to accompany patient in pre-op.  This support person may not rotate.    Please refer to the [facility] website for the visitor guidelines for Inpatients (after your surgery is over and you are in a regular room).    Your procedure is scheduled on: 10/13/22   Report to [facility] Main Entrance    Report to admitting at 12:15 PM   Call this number if you have problems the morning of surgery [phone]   Do not eat food or drink fluids :After Midnight.          If you have questions, please contact your surgeon's office.   FOLLOW BOWEL PREP AND ANY ADDITIONAL PRE OP INSTRUCTIONS YOU RECEIVED FROM YOUR SURGEON'S OFFICE!!!     Oral Hygiene is also important to reduce your risk of infection.                                    Remember - BRUSH YOUR TEETH THE MORNING OF SURGERY WITH YOUR REGULAR TOOTHPASTE  DENTURES WILL BE REMOVED PRIOR TO SURGERY PLEASE DO NOT APPLY "Poly grip" OR ADHESIVES!!!   Do NOT smoke after Midnight   Take these medicines the morning of surgery with A SIP OF WATER: Inhalers, Amlodipine, Omeprazole  These are anesthesia recommendations for holding your anticoagulants.  Please contact your prescribing physician to confirm IF it is safe to hold your anticoagulants for this length of time.   Eliquis Apixaban   72 hours   Xarelto Rivaroxaban   72 hours  Plavix Clopidogrel   120 hours  Pletal Cilostazol   120 hours    DO NOT TAKE ANY ORAL DIABETIC MEDICATIONS DAY OF YOUR SURGERY  How to Manage Your Diabetes Before  and After Surgery  Why is it important to control my blood sugar before and after surgery? Improving blood sugar levels before and after surgery helps healing and can limit problems. A way of improving blood sugar control is eating a healthy diet by:  Eating less sugar and carbohydrates  Increasing activity/exercise  Talking with your doctor about reaching your blood sugar goals High blood sugars (greater than 180 mg/dL) can raise your risk of infections and slow your recovery, so you will need to focus on controlling your diabetes during the weeks before surgery. Make sure that the doctor who takes care of your diabetes knows about your planned surgery including the date and location.  How do I manage my blood sugar before surgery? Check your blood sugar at least 4 times a day, starting 2 days before surgery, to make sure that the level is not too high or low. Check your blood sugar the morning of your surgery when you wake up and every 2 hours until you get to the Short Stay unit. If your blood sugar is less than 70 mg/dL, you will need to treat for low blood sugar: Do not take insulin. Treat a low blood sugar (less than 70 mg/dL) with  cup of clear juice (cranberry or apple), 4 glucose tablets, OR glucose gel. Recheck blood sugar in 15 minutes after treatment (to make sure it is greater than 70 mg/dL). If your blood sugar is not greater than 70 mg/dL on recheck, call [phone] for further instructions. Report your blood sugar to the short stay nurse when you get to Short Stay.  If you are admitted to the hospital after surgery: Your blood sugar will be checked by the staff and you will probably be given insulin after surgery (instead of oral diabetes medicines) to make sure you have good blood sugar levels. The goal for blood sugar control after surgery is 80-180 mg/dL.   WHAT DO I DO ABOUT MY DIABETES MEDICATION?  Do not take oral diabetes medicines (pills) the morning of  surgery.  THE DAY BEFORE SURGERY, take Metformin as prescribed.      THE MORNING OF SURGERY, do not take Metformin.  DO NOT TAKE THE FOLLOWING 7 DAYS PRIOR TO SURGERY: Ozempic, Wegovy, Rybelsus (Semaglutide), Byetta (exenatide), Bydureon (exenatide ER), Victoza, Saxenda (liraglutide), or Trulicity (dulaglutide) Mounjaro (Tirzepatide) Adlyxin (Lixisenatide), Polyethylene Glycol Loxenatide.  If your CBG is greater than 220 mg/dL, you may take  of your sliding scale  (correction) dose of insulin.  Reviewed and Endorsed by [facility] Patient Education Committee, August 2015  Bring CPAP mask and tubing day of surgery.                              You may not have any metal on your body including jewelry, and body piercing             Do not wear lotions, powders, cologne, or deodorant              Men may shave face and neck.   Do not bring valuables to the hospital. [place].   Contacts, glasses, dentures or bridgework may not be worn into surgery.   Bring small overnight bag day of surgery.   DO NOT [place]. PHARMACY WILL DISPENSE MEDICATIONS LISTED ON YOUR MEDICATION LIST TO YOU DURING YOUR ADMISSION [place]!   Special Instructions: Bring a copy of your healthcare power of attorney and living will documents the day of surgery if you haven't scanned them before.              Please read over the following fact sheets you were given: IF [place] [phone] Schneiders   If you received a COVID test during your pre-op visit  it is requested that you wear a mask when out in public, stay away from anyone that may not be feeling well and notify your surgeon if you develop symptoms. If you test positive for Covid or have been in contact with anyone that has tested positive in the last 10 days please notify you surgeon.    [place] -  Preparing for Surgery Before surgery, you can play an important role.  Because skin is not sterile, your skin needs to be as free of germs as possible.  You can reduce the number of germs on your skin by washing with CHG (chlorahexidine gluconate) soap before surgery.  CHG is an antiseptic cleaner which kills germs and bonds with the skin to continue  killing germs even after washing. Please DO NOT use if you have an allergy to CHG or antibacterial soaps.  If your skin becomes reddened/irritated stop using the CHG and inform your nurse when you arrive at Short Stay. Do not shave (including legs and underarms) for at least 48 hours prior to the first CHG shower.  You may shave your face/neck.  Please follow these instructions carefully:  1.  Shower with CHG Soap the night before surgery and the  morning of surgery.  2.  If you choose to wash your hair, wash your hair first as usual with your normal  shampoo.  3.  After you shampoo, rinse your hair and body thoroughly to remove the shampoo.                             4.  Use CHG as you would any other liquid soap.  You can apply chg directly to the skin and wash.  Gently with a scrungie or clean washcloth.  5.  Apply the CHG Soap to your body ONLY FROM THE NECK DOWN.   Do   not use on face/ open                           Wound or open sores. Avoid contact with eyes, ears mouth and   genitals (private parts).                       Wash face,  Genitals (private parts) with your normal soap.             6.  Wash thoroughly, paying special attention to the area where your    surgery  will be performed.  7.  Thoroughly rinse your body with warm water from the neck down.  8.  DO NOT shower/wash with your normal soap after using and rinsing off the CHG Soap.                9.  Pat yourself dry with a clean towel.            10.  Wear clean pajamas.            11.  Place clean sheets on your bed the night of your first shower and do not  sleep with  pets. Day of Surgery : Do not apply any lotions/deodorants the morning of surgery.  Please wear clean clothes to the hospital/surgery center.  FAILURE TO FOLLOW THESE INSTRUCTIONS MAY RESULT IN THE CANCELLATION OF YOUR SURGERY  PATIENT SIGNATURE_________________________________  NURSE SIGNATURE__________________________________  ________________________________________________________________________

## 2022-10-01 ENCOUNTER — Encounter (HOSPITAL_COMMUNITY): Payer: Self-pay

## 2022-10-01 ENCOUNTER — Encounter (HOSPITAL_COMMUNITY)
Admission: RE | Admit: 2022-10-01 | Discharge: 2022-10-01 | Disposition: A | Discharge status: Home / Self Care | Payer: Medicaid Other | Source: Ambulatory Visit | Attending: Urology | Admitting: Urology

## 2022-10-01 VITALS — BP 133/91 | HR 71 | Temp 98.1°F | Resp 18 | Ht 76.0 in | Wt 301.0 lb

## 2022-10-01 DIAGNOSIS — E119 Type 2 diabetes mellitus without complications: Secondary | ICD-10-CM | POA: Diagnosis not present

## 2022-10-01 DIAGNOSIS — Z86718 Personal history of other venous thrombosis and embolism: Secondary | ICD-10-CM | POA: Insufficient documentation

## 2022-10-01 DIAGNOSIS — Z01812 Encounter for preprocedural laboratory examination: Secondary | ICD-10-CM | POA: Insufficient documentation

## 2022-10-01 DIAGNOSIS — I10 Essential (primary) hypertension: Secondary | ICD-10-CM | POA: Insufficient documentation

## 2022-10-01 DIAGNOSIS — J449 Chronic obstructive pulmonary disease, unspecified: Secondary | ICD-10-CM | POA: Insufficient documentation

## 2022-10-01 DIAGNOSIS — N400 Enlarged prostate without lower urinary tract symptoms: Secondary | ICD-10-CM | POA: Diagnosis not present

## 2022-10-01 DIAGNOSIS — Z7984 Long term (current) use of oral hypoglycemic drugs: Secondary | ICD-10-CM | POA: Insufficient documentation

## 2022-10-01 HISTORY — DX: Prediabetes: R73.03

## 2022-10-01 HISTORY — DX: Pneumonia, unspecified organism: J18.9

## 2022-10-01 LAB — CBC
HCT: 39.5 % (ref 39.0–52.0)
Hemoglobin: 12.7 g/dL — ABNORMAL LOW (ref 13.0–17.0)
MCH: 28.8 pg (ref 26.0–34.0)
MCHC: 32.2 g/dL (ref 30.0–36.0)
MCV: 89.6 fL (ref 80.0–100.0)
Platelets: 232 10*3/uL (ref 150–400)
RBC: 4.41 MIL/uL (ref 4.22–5.81)
RDW: 15.3 % (ref 11.5–15.5)
WBC: 5 10*3/uL (ref 4.0–10.5)
nRBC: 0 % (ref 0.0–0.2)

## 2022-10-01 LAB — BASIC METABOLIC PANEL
Anion gap: 6 (ref 5–15)
BUN: 10 mg/dL (ref 8–23)
CO2: 23 mmol/L (ref 22–32)
Calcium: 9 mg/dL (ref 8.9–10.3)
Chloride: 106 mmol/L (ref 98–111)
Creatinine, Ser: 0.76 mg/dL (ref 0.61–1.24)
GFR, Estimated: >60 mL/min (ref >60)
Glucose, Bld: 158 mg/dL — ABNORMAL HIGH (ref 70–99)
Potassium: 4.1 mmol/L (ref 3.5–5.1)
Sodium: 135 mmol/L (ref 135–145)

## 2022-10-01 LAB — GLUCOSE, CAPILLARY: Glucose-Capillary: 183 mg/dL — ABNORMAL HIGH (ref 70–99)

## 2022-10-02 LAB — HEMOGLOBIN A1C
Hgb A1c MFr Bld: 9.3 % — ABNORMAL HIGH (ref 4.8–5.6)
Mean Plasma Glucose: 220 mg/dL

## 2022-10-02 NOTE — Progress Notes (Signed)
Anesthesia Chart Review   Case: [id number] Date/Time: 10/13/22 1400   Procedure: TRANSURETHRAL RESECTION OF THE PROSTATE (TURP) - 90 MINS   Anesthesia type: General   Pre-op diagnosis: BENIGN PROSTATIC HYPERPLASIA   Location: [facility] ROOM 03 / [facility]   Surgeons: Robley Fries, MD       DISCUSSION:66 y.o. smoker with h/o HTN, COPD, DVT, strok, BPH scheduled for above procedure 10/13/2022 with Dr. Jacalyn Lefevre.   See previous anesthesia chart review note 08/31/2022.   Case previously postponed to complete evaluation of pulmonary HTN and bronchoscopy was pending.   Echo 08/31/2022 with no evidence of pulmonary HTN.    Bronchoscopy completed 09/09/2022. Per pulmonology note 09/14/2022 he can proceed with urology procedure.   Echo 08/31/2022 Summary   1. The left ventricle is mildly dilated in size with upper normal wall  thickness.   2. The left ventricular systolic function is normal, LVEF is visually  estimated at > 55%.    3. The aortic valve is trileaflet with mildly thickened leaflets with normal  excursion.   4. There is mild aortic regurgitation.    5. The ascending aorta is mildly dilated.    6. The left atrium is mildly dilated in size.    7. The right ventricle is normal in size, with normal systolic function.    8. The right atrium is mildly dilated in size.    9. There is no evidence of pulmonary hypertension by Doppler interrogation.  VS: BP (!) 133/91   Pulse 71   Temp 36.7 C (Oral)   Resp 18   Ht '6\' 4"'$  (1.93 m)   Wt (!) 136.5 kg   SpO2 94%   BMI 36.64 kg/m   PROVIDERS: Nolene Ebbs, MD is PCP   Cardiologist - Rex Kras, DO  LABS: Labs reviewed: Acceptable for surgery. (all labs ordered are listed, but only abnormal results are displayed)  Labs Reviewed  HEMOGLOBIN A1C - Abnormal; Notable for the following components:      Result Value   Hgb A1c MFr Bld 9.3 (*)    All other components within normal limits  BASIC METABOLIC PANEL - Abnormal;  Notable for the following components:   Glucose, Bld 158 (*)    All other components within normal limits  CBC - Abnormal; Notable for the following components:   Hemoglobin 12.7 (*)    All other components within normal limits  GLUCOSE, CAPILLARY - Abnormal; Notable for the following components:   Glucose-Capillary 183 (*)    All other components within normal limits     IMAGES:   EKG:   CV: Echocardiogram 04/06/2022:  Left ventricle cavity is normal in size. Mild concentric hypertrophy of  the left ventricle. Normal global wall motion. Normal LV systolic function  with EF 58%. Doppler evidence of grade I (impaired) diastolic dysfunction,  normal LAP.  The aortic root is dilated, maximal dimension 4.4 cm at sinotubular  junction.  Structurally normal trileaflet aortic valve.  Mild (Grade I) aortic  regurgitation.  Mild to moderate mitral regurgitation.  Normal right atrial pressure.   Lexiscan Nuclear stress test 04/13/2022: Nondiagnostic ECG stress. The heart rate response was consistent with Regadenoson.  Myocardial perfusion is abnormal. Mild degree large extent perfusion defect consistent with moderate ischemia located in the apical anterior wall, apical lateral wall, mid inferolateral wall and basal inferolateral wall (Left Anterior Descending Artery and Left Circumflex Artery regions) of left ventricle. LV is dilated in both rest and stress, more dilated in  stress although calculated TID index is normal. Overall LV systolic function is abnormal with inferior and inferolateral akinesis and global hypokinesis.  Stress LVEF mildly reduced at LV EF: 42%.  No previous exam available for comparison. High risk and multivessel disease cannot be excluded.  Past Medical History:  Diagnosis Date   Alcohol abuse    history of   Arthritis    Left knee, lower back   Chest pain    hx   COPD (chronic obstructive pulmonary disease) ([place])    "[place] said I don't have this"  (10/26/2016)   Depression    no meds   DVT (deep venous thrombosis) ([place]) 2008   Right leg- txed with coumadin    DVT (deep venous thrombosis) ([place]) 2010; 2017   RLE; RLE   Gastroesophageal reflux disease 2014   Hypertension    MI, old ~ 2007   Pneumonia    Pre-diabetes    Stroke ([place]) 01/2012   denies residual on 10/26/2016   Substance abuse ([place])    Alcohol dependence Hx   Tobacco abuse     Past Surgical History:  Procedure Laterality Date   GANGLION CYST EXCISION Left 2000   wrist   HAMMER TOE SURGERY Left 07/2012   2nd toe/08/05/2012   LEFT HEART CATH AND CORONARY ANGIOGRAPHY N/A 05/19/2022   Procedure: LEFT HEART CATH AND CORONARY ANGIOGRAPHY;  Surgeon: [name], MD;  Location: [facility];  Service: Cardiovascular;  Laterality: N/A;   LUMBAR DISC SURGERY  06/2007   Herniated disc/notes 02/25/2011   WISDOM TOOTH EXTRACTION      MEDICATIONS:  albuterol (PROVENTIL) (2.5 MG/3ML) 0.083% nebulizer solution   amLODipine (NORVASC) 5 MG tablet   aspirin EC 81 MG tablet   Budeson-Glycopyrrol-Formoterol (BREZTRI AEROSPHERE) 160-9-4.8 MCG/ACT AERO   diclofenac Sodium (VOLTAREN ARTHRITIS PAIN) 1 % GEL   ELIQUIS 5 MG TABS tablet   hydrochlorothiazide (MICROZIDE) 12.5 MG capsule   metFORMIN (GLUCOPHAGE) 500 MG tablet   nitroGLYCERIN (NITROSTAT) 0.4 MG SL tablet   omeprazole (PRILOSEC) 20 MG capsule   rosuvastatin (CRESTOR) 20 MG tablet   No current facility-administered medications for this encounter.     Brent Felix Ward, PA-C [facility]-Surgical Testing [phone]

## 2022-10-13 ENCOUNTER — Other Ambulatory Visit: Payer: Self-pay

## 2022-10-13 ENCOUNTER — Encounter (HOSPITAL_COMMUNITY): Payer: Self-pay | Admitting: Urology

## 2022-10-13 ENCOUNTER — Ambulatory Visit (HOSPITAL_COMMUNITY): Payer: Medicaid Other | Admitting: Physician Assistant

## 2022-10-13 ENCOUNTER — Encounter (HOSPITAL_COMMUNITY)
Admission: RE | Disposition: A | Discharge status: Home / Self Care | Payer: Self-pay | Source: Home / Self Care | Attending: Urology

## 2022-10-13 ENCOUNTER — Ambulatory Visit (HOSPITAL_COMMUNITY)
Admission: RE | Admit: 2022-10-13 | Discharge: 2022-10-14 | Disposition: A | Discharge status: Home / Self Care | Payer: Medicaid Other | Attending: Urology | Admitting: Urology

## 2022-10-13 ENCOUNTER — Ambulatory Visit (HOSPITAL_BASED_OUTPATIENT_CLINIC_OR_DEPARTMENT_OTHER): Payer: Medicaid Other | Admitting: Anesthesiology

## 2022-10-13 DIAGNOSIS — F17210 Nicotine dependence, cigarettes, uncomplicated: Secondary | ICD-10-CM | POA: Diagnosis not present

## 2022-10-13 DIAGNOSIS — J449 Chronic obstructive pulmonary disease, unspecified: Secondary | ICD-10-CM | POA: Insufficient documentation

## 2022-10-13 DIAGNOSIS — Z7901 Long term (current) use of anticoagulants: Secondary | ICD-10-CM | POA: Diagnosis not present

## 2022-10-13 DIAGNOSIS — N320 Bladder-neck obstruction: Secondary | ICD-10-CM | POA: Diagnosis not present

## 2022-10-13 DIAGNOSIS — I10 Essential (primary) hypertension: Secondary | ICD-10-CM | POA: Insufficient documentation

## 2022-10-13 DIAGNOSIS — Z86718 Personal history of other venous thrombosis and embolism: Secondary | ICD-10-CM | POA: Insufficient documentation

## 2022-10-13 DIAGNOSIS — N35912 Unspecified bulbous urethral stricture, male: Secondary | ICD-10-CM | POA: Insufficient documentation

## 2022-10-13 DIAGNOSIS — M1990 Unspecified osteoarthritis, unspecified site: Secondary | ICD-10-CM | POA: Insufficient documentation

## 2022-10-13 DIAGNOSIS — Z8673 Personal history of transient ischemic attack (TIA), and cerebral infarction without residual deficits: Secondary | ICD-10-CM

## 2022-10-13 DIAGNOSIS — Z79899 Other long term (current) drug therapy: Secondary | ICD-10-CM | POA: Diagnosis not present

## 2022-10-13 DIAGNOSIS — N200 Calculus of kidney: Secondary | ICD-10-CM

## 2022-10-13 DIAGNOSIS — K219 Gastro-esophageal reflux disease without esophagitis: Secondary | ICD-10-CM | POA: Diagnosis not present

## 2022-10-13 DIAGNOSIS — E119 Type 2 diabetes mellitus without complications: Secondary | ICD-10-CM

## 2022-10-13 DIAGNOSIS — N138 Other obstructive and reflux uropathy: Secondary | ICD-10-CM | POA: Diagnosis present

## 2022-10-13 DIAGNOSIS — F17200 Nicotine dependence, unspecified, uncomplicated: Secondary | ICD-10-CM | POA: Diagnosis not present

## 2022-10-13 DIAGNOSIS — F32A Depression, unspecified: Secondary | ICD-10-CM | POA: Insufficient documentation

## 2022-10-13 DIAGNOSIS — I252 Old myocardial infarction: Secondary | ICD-10-CM | POA: Diagnosis not present

## 2022-10-13 DIAGNOSIS — N401 Enlarged prostate with lower urinary tract symptoms: Secondary | ICD-10-CM | POA: Diagnosis present

## 2022-10-13 DIAGNOSIS — I2510 Atherosclerotic heart disease of native coronary artery without angina pectoris: Secondary | ICD-10-CM | POA: Insufficient documentation

## 2022-10-13 HISTORY — PX: TRANSURETHRAL RESECTION OF PROSTATE: SHX73

## 2022-10-13 SURGERY — TURP (TRANSURETHRAL RESECTION OF PROSTATE)
Anesthesia: General

## 2022-10-13 MED ORDER — ACETAMINOPHEN 10 MG/ML IV SOLN: 1000.0000 mg | Freq: Once | INTRAVENOUS | Status: DC | PRN

## 2022-10-13 MED ORDER — ONDANSETRON HCL 4 MG/2ML IJ SOLN: 4.0000 mg | Freq: Once | INTRAMUSCULAR | Status: DC | PRN

## 2022-10-13 MED ORDER — ORAL CARE MOUTH RINSE: 15.0000 mL | Freq: Once | OROMUCOSAL | Status: AC

## 2022-10-13 MED ORDER — OXYCODONE HCL 5 MG PO TABS: 5.0000 mg | ORAL_TABLET | ORAL | Status: DC | PRN

## 2022-10-13 MED ORDER — SODIUM CHLORIDE 0.9 % IR SOLN
Status: DC | PRN
  Administered 2022-10-13: 15000 mL

## 2022-10-13 MED ORDER — LACTATED RINGERS IV SOLN: INTRAVENOUS | Status: DC

## 2022-10-13 MED ORDER — DIPHENHYDRAMINE HCL 12.5 MG/5ML PO ELIX: 12.5000 mg | ORAL_SOLUTION | Freq: Four times a day (QID) | ORAL | Status: DC | PRN

## 2022-10-13 MED ORDER — PHENYLEPHRINE 80 MCG/ML (10ML) SYRINGE FOR IV PUSH (FOR BLOOD PRESSURE SUPPORT)
PREFILLED_SYRINGE | INTRAVENOUS | Status: DC | PRN
  Administered 2022-10-13: 160 ug via INTRAVENOUS

## 2022-10-13 MED ORDER — SENNOSIDES-DOCUSATE SODIUM 8.6-50 MG PO TABS
2.0000 | ORAL_TABLET | Freq: Every day | ORAL | Status: DC
  Administered 2022-10-13: 2 via ORAL
  Filled 2022-10-13: qty 2

## 2022-10-13 MED ORDER — KETOROLAC TROMETHAMINE 15 MG/ML IJ SOLN: 15.0000 mg | Freq: Once | INTRAMUSCULAR | Status: DC | PRN

## 2022-10-13 MED ORDER — EPHEDRINE SULFATE-NACL 50-0.9 MG/10ML-% IV SOSY
PREFILLED_SYRINGE | INTRAVENOUS | Status: DC | PRN
  Administered 2022-10-13: 10 mg via INTRAVENOUS

## 2022-10-13 MED ORDER — DIPHENHYDRAMINE HCL 50 MG/ML IJ SOLN: 12.5000 mg | Freq: Four times a day (QID) | INTRAMUSCULAR | Status: DC | PRN

## 2022-10-13 MED ORDER — AMISULPRIDE (ANTIEMETIC) 5 MG/2ML IV SOLN: 10.0000 mg | Freq: Once | INTRAVENOUS | Status: DC | PRN

## 2022-10-13 MED ORDER — FENTANYL CITRATE (PF) 100 MCG/2ML IJ SOLN
INTRAMUSCULAR | Status: DC | PRN
  Administered 2022-10-13 (×2): 50 ug via INTRAVENOUS

## 2022-10-13 MED ORDER — ALBUTEROL SULFATE (2.5 MG/3ML) 0.083% IN NEBU: 2.5000 mg | INHALATION_SOLUTION | Freq: Four times a day (QID) | RESPIRATORY_TRACT | Status: DC | PRN

## 2022-10-13 MED ORDER — CEFAZOLIN SODIUM-DEXTROSE 2-4 GM/100ML-% IV SOLN
2.0000 g | INTRAVENOUS | Status: AC
  Administered 2022-10-13: 2 g via INTRAVENOUS
  Filled 2022-10-13: qty 100

## 2022-10-13 MED ORDER — DEXTROSE-NACL 5-0.45 % IV SOLN: INTRAVENOUS | Status: DC

## 2022-10-13 MED ORDER — MIDAZOLAM HCL 2 MG/2ML IJ SOLN
INTRAMUSCULAR | Status: AC
  Filled 2022-10-13: qty 2

## 2022-10-13 MED ORDER — DEXAMETHASONE SODIUM PHOSPHATE 10 MG/ML IJ SOLN
INTRAMUSCULAR | Status: DC | PRN
  Administered 2022-10-13: 10 mg via INTRAVENOUS

## 2022-10-13 MED ORDER — AMLODIPINE BESYLATE 5 MG PO TABS
5.0000 mg | ORAL_TABLET | Freq: Every day | ORAL | Status: DC
  Administered 2022-10-14: 5 mg via ORAL
  Filled 2022-10-13: qty 1

## 2022-10-13 MED ORDER — ONDANSETRON HCL 4 MG/2ML IJ SOLN
INTRAMUSCULAR | Status: DC | PRN
  Administered 2022-10-13: 4 mg via INTRAVENOUS

## 2022-10-13 MED ORDER — ACETAMINOPHEN 325 MG PO TABS: 650.0000 mg | ORAL_TABLET | ORAL | Status: DC | PRN

## 2022-10-13 MED ORDER — SODIUM CHLORIDE 0.9 % IR SOLN
3000.0000 mL | Status: DC
  Administered 2022-10-13: 3000 mL

## 2022-10-13 MED ORDER — FENTANYL CITRATE (PF) 100 MCG/2ML IJ SOLN
INTRAMUSCULAR | Status: AC
  Filled 2022-10-13: qty 2

## 2022-10-13 MED ORDER — MIDAZOLAM HCL 5 MG/5ML IJ SOLN
INTRAMUSCULAR | Status: DC | PRN
  Administered 2022-10-13: 2 mg via INTRAVENOUS

## 2022-10-13 MED ORDER — TRIPLE ANTIBIOTIC 3.5-400-5000 EX OINT: 1.0000 | TOPICAL_OINTMENT | Freq: Three times a day (TID) | CUTANEOUS | Status: DC | PRN

## 2022-10-13 MED ORDER — SODIUM CHLORIDE 0.9% FLUSH
3.0000 mL | Freq: Two times a day (BID) | INTRAVENOUS | Status: DC
  Administered 2022-10-13: 3 mL via INTRAVENOUS

## 2022-10-13 MED ORDER — SODIUM CHLORIDE 0.9% FLUSH: 3.0000 mL | INTRAVENOUS | Status: DC | PRN

## 2022-10-13 MED ORDER — ROSUVASTATIN CALCIUM 20 MG PO TABS
20.0000 mg | ORAL_TABLET | Freq: Every day | ORAL | Status: DC
  Administered 2022-10-13 – 2022-10-14 (×2): 20 mg via ORAL
  Filled 2022-10-13 (×2): qty 1

## 2022-10-13 MED ORDER — 0.9 % SODIUM CHLORIDE (POUR BTL) OPTIME
TOPICAL | Status: DC | PRN
  Administered 2022-10-13: 1000 mL

## 2022-10-13 MED ORDER — CHLORHEXIDINE GLUCONATE 0.12 % MT SOLN
15.0000 mL | Freq: Once | OROMUCOSAL | Status: AC
  Administered 2022-10-13: 15 mL via OROMUCOSAL

## 2022-10-13 MED ORDER — BUDESON-GLYCOPYRROL-FORMOTEROL 160-9-4.8 MCG/ACT IN AERO: 2.0000 | INHALATION_SPRAY | Freq: Two times a day (BID) | RESPIRATORY_TRACT | Status: DC

## 2022-10-13 MED ORDER — SODIUM CHLORIDE 0.9 % IV SOLN: 250.0000 mL | INTRAVENOUS | Status: DC | PRN

## 2022-10-13 MED ORDER — MORPHINE SULFATE (PF) 2 MG/ML IV SOLN: 2.0000 mg | INTRAVENOUS | Status: DC | PRN

## 2022-10-13 MED ORDER — METFORMIN HCL 500 MG PO TABS
500.0000 mg | ORAL_TABLET | Freq: Two times a day (BID) | ORAL | Status: DC
  Administered 2022-10-14: 500 mg via ORAL
  Filled 2022-10-13: qty 1

## 2022-10-13 MED ORDER — NITROGLYCERIN 0.4 MG SL SUBL: 0.4000 mg | SUBLINGUAL_TABLET | SUBLINGUAL | Status: DC | PRN

## 2022-10-13 MED ORDER — ONDANSETRON HCL 4 MG/2ML IJ SOLN: 4.0000 mg | INTRAMUSCULAR | Status: DC | PRN

## 2022-10-13 MED ORDER — PROPOFOL 10 MG/ML IV BOLUS
INTRAVENOUS | Status: AC
  Filled 2022-10-13: qty 20

## 2022-10-13 MED ORDER — HYDROCHLOROTHIAZIDE 12.5 MG PO TABS
12.5000 mg | ORAL_TABLET | Freq: Every day | ORAL | Status: DC
  Administered 2022-10-13 – 2022-10-14 (×2): 12.5 mg via ORAL
  Filled 2022-10-13 (×2): qty 1

## 2022-10-13 MED ORDER — PANTOPRAZOLE SODIUM 40 MG PO TBEC
40.0000 mg | DELAYED_RELEASE_TABLET | Freq: Every day | ORAL | Status: DC
  Administered 2022-10-14: 40 mg via ORAL
  Filled 2022-10-13: qty 1

## 2022-10-13 MED ORDER — LIDOCAINE 2% (20 MG/ML) 5 ML SYRINGE
INTRAMUSCULAR | Status: DC | PRN
  Administered 2022-10-13: 80 mg via INTRAVENOUS

## 2022-10-13 MED ORDER — FENTANYL CITRATE PF 50 MCG/ML IJ SOSY: 25.0000 ug | PREFILLED_SYRINGE | INTRAMUSCULAR | Status: DC | PRN

## 2022-10-13 MED ORDER — PROPOFOL 10 MG/ML IV BOLUS
INTRAVENOUS | Status: DC | PRN
  Administered 2022-10-13: 200 mg via INTRAVENOUS

## 2022-10-13 SURGICAL SUPPLY — 21 items
BAG DRN RND TRDRP ANRFLXCHMBR (UROLOGICAL SUPPLIES) ×1
BAG URINE DRAIN 2000ML AR STRL (UROLOGICAL SUPPLIES) IMPLANT
BAG URO CATCHER STRL LF (MISCELLANEOUS) ×2 IMPLANT
BALLN OPTILUME DCB 30X5X75 (BALLOONS) ×1
BALLOON OPTILUME DCB 30X5X75 (BALLOONS) IMPLANT
CATH HEMA 3WAY 30CC 22FR COUDE (CATHETERS) IMPLANT
DRAPE FOOT SWITCH (DRAPES) ×2 IMPLANT
GLOVE BIO SURGEON STRL SZ 6.5 (GLOVE) ×2 IMPLANT
GOWN STRL REUS W/ TWL LRG LVL3 (GOWN DISPOSABLE) ×2 IMPLANT
GOWN STRL REUS W/TWL LRG LVL3 (GOWN DISPOSABLE) ×1
GUIDEWIRE STR DUAL SENSOR (WIRE) IMPLANT
HOLDER FOLEY CATH W/STRAP (MISCELLANEOUS) ×2 IMPLANT
LOOP CUT BIPOLAR 24F LRG (ELECTROSURGICAL) ×2 IMPLANT
MANIFOLD NEPTUNE II (INSTRUMENTS) ×2 IMPLANT
PACK CYSTO (CUSTOM PROCEDURE TRAY) ×2 IMPLANT
PLUG CATH AND CAP STER (CATHETERS) ×2 IMPLANT
SYR 30ML LL (SYRINGE) IMPLANT
SYR TOOMEY IRRIG 70ML (MISCELLANEOUS) ×1
SYRINGE TOOMEY IRRIG 70ML (MISCELLANEOUS) ×2 IMPLANT
TUBING CONNECTING 10 (TUBING) ×2 IMPLANT
TUBING UROLOGY SET (TUBING) ×2 IMPLANT

## 2022-10-13 NOTE — Discharge Instructions (Signed)

## 2022-10-13 NOTE — Anesthesia Procedure Notes (Signed)
Procedure Name: LMA Insertion Date/Time: 10/13/2022 4:00 PM  Performed by: Gean Maidens, CRNAPre-anesthesia Checklist: Patient identified, Emergency Drugs available, Suction available, Patient being monitored and Timeout performed Patient Re-evaluated:Patient Re-evaluated prior to induction Oxygen Delivery Method: Circle system utilized Preoxygenation: Pre-oxygenation with 100% oxygen Induction Type: IV induction Ventilation: Mask ventilation without difficulty LMA: LMA inserted LMA Size: 5.0 Number of attempts: 1 Placement Confirmation: positive ETCO2 and breath sounds checked- equal and bilateral Tube secured with: Tape Dental Injury: Teeth and Oropharynx as per pre-operative assessment

## 2022-10-13 NOTE — H&P (Signed)
CC/HPI: cc: LUTs   02/27/22: 66 year old man comes in with lower urinary tract symptoms specifically daytime frequency, weak stream, incomplete emptying and nocturia. He did try tamsulosin 0.4 mg for few weeks but did not have improved results. He has no family history of prostate cancer. He denies gross hematuria, kidney stones or UTIs. He is on Eliquis for DVTs. He drinks a pot of coffee during the day followed by soda.   IPSS: 5, 5, 4, 5, 3, 3, 3=28/35  5/6 unhappy    04/03/2022: 66 year old man with a history of BPH with LUTS here for follow-up after he increased tamsulosin from 0.4 mg to 0.8 mg. He has had no improvement with symptoms. He is also try to cut back on his bladder irritants and now drinks 1 cup of coffee a day followed by propel during the day. He is not drinking any soda. He still having significant nocturia and daytime frequency. PSA was checked and found to be less than 1.   06/26/2022: 66 year old male with a history of BPH with LUTS here for cystoscopy. He has been on Flomax 0.8 mg nightly and was started on finasteride 5 mg daily in June. He does feel that overall symptoms are improving but he is not 100% satisfied. He is also pending left knee surgery.    08/24/2022:  Patient returns to clinic for preop evaluation and clearance for upcoming TURP on 09/01/2022. Patient has been cleared by cardiology. He will hold his Eliquis 2 days prior to surgery. Patient unable to confirm current meds by urology, but believes he continues finasteride only at this time. Since last seen, no interim new medications, diagnoses, or procedures. UA WNL today. He continues with daytime frequency every 30 minutes, nocturia x4, urgency, incomplete emptying, weakened force of stream. He denies leaking, gross hematuria, dysuria, fever/chills, nausea/vomiting, flank pain, shortness of breath.   09/29/2022: Brent Byrd is a 66 year old man who is scheduled to undergo TURP on 10/13/2022. He was previously  scheduled last month but unfortunately had a conflict. He has been cleared by cardiology. He understands to hold his Eliquis 3 days prior to surgery. He continues on finasteride. He has had no new medications since he was last seen. He denies fevers, chills, shortness of breath. He denies chest pain.     ALLERGIES: IVP Dye    MEDICATIONS: Finasteride 5 mg tablet 1 tablet PO Daily  Metformin Hcl 500 mg tablet  Omeprazole 40 mg capsule,delayed release  Tamsulosin Hcl 0.4 mg capsule 2 capsule PO Q HS  Amlodipine Besylate 5 mg tablet  Atorvastatin Calcium 40 mg tablet  Eliquis 5 mg tablet  Gabapentin 300 mg capsule     GU PSH: Complex Uroflow - 06/26/2022 Cystoscopy - 06/26/2022     NON-GU PSH: None   GU PMH: BPH w/LUTS - 08/24/2022, - 06/26/2022, - 04/03/2022, - 02/27/2022 Incomplete bladder emptying - 08/24/2022, - 06/26/2022, - 04/03/2022, - 02/27/2022 Nocturia - 08/24/2022, - 06/26/2022, - 04/03/2022, - 02/27/2022 Urinary Frequency - 08/24/2022, - 04/03/2022, - 02/27/2022 Weak Urinary Stream - 08/24/2022, - 06/26/2022, - 04/03/2022, - 02/27/2022 Encounter for Prostate Cancer screening - 02/27/2022    NON-GU PMH: Arthritis DVT, History GERD Gout Hypertension    FAMILY HISTORY: None   SOCIAL HISTORY: Marital Status: Divorced Preferred Language: English; Ethnicity: Not Hispanic Or Latino; Race: Black or African American Current Smoking Status: Patient does not smoke anymore.   Tobacco Use Assessment Completed: Used Tobacco in last 30 days? Drinks 4+ caffeinated drinks per day.  REVIEW OF SYSTEMS:    GU Review Male:   Patient reports frequent urination, hard to postpone urination, burning/ pain with urination, and get up at night to urinate. Patient denies leakage of urine, stream starts and stops, trouble starting your stream, have to strain to urinate , erection problems, and penile pain.  Gastrointestinal (Upper):   Patient denies nausea, vomiting, and indigestion/ heartburn.  Gastrointestinal  (Lower):   Patient denies diarrhea and constipation.  Constitutional:   Patient denies fever, night sweats, weight loss, and fatigue.  Skin:   Patient denies skin rash/ lesion and itching.  Eyes:   Patient denies blurred vision and double vision.  Ears/ Nose/ Throat:   Patient denies sore throat and sinus problems.  Hematologic/Lymphatic:   Patient denies swollen glands and easy bruising.  Cardiovascular:   Patient denies leg swelling and chest pains.  Respiratory:   Patient denies cough and shortness of breath.  Endocrine:   Patient denies excessive thirst.  Musculoskeletal:   Patient denies back pain and joint pain.  Neurological:   Patient denies headaches and dizziness.  Psychologic:   Patient denies depression and anxiety.   Notes: Updated from previous visit 08/24/2022 with review from patient as noted above.   VITAL SIGNS:      09/29/2022 08:58 AM  BP 119/81 mmHg  Pulse 77 /min  Temperature 97.8 F / 36.5 C   MULTI-SYSTEM PHYSICAL EXAMINATION:    Constitutional: Well-nourished. No physical deformities. Normally developed. Good grooming.  Respiratory: Normal breath sounds. No labored breathing, no use of accessory muscles.   Cardiovascular: Regular rate and rhythm. No murmur, no gallop. Warm extremities. Normal extremity pulses, no swelling, no varicosities.   Skin: No paleness, no jaundice, no cyanosis. No lesion, no ulcer, no rash.  Neurologic / Psychiatric: Oriented to time, oriented to place, oriented to person. No depression, no anxiety, no agitation.  Gastrointestinal: No mass, no tenderness, no rigidity, non obese abdomen.     Complexity of Data:  Source Of History:  Patient  Records Review:   Previous Doctor Records, Previous Patient Records  Urine Test Review:   Urinalysis   02/27/22  PSA  Total PSA 0.13 ng/mL    09/29/22  Urinalysis  Urine Appearance Clear   Urine Color Yellow   Urine Glucose Neg mg/dL  Urine Bilirubin Neg mg/dL  Urine Ketones Neg mg/dL   Urine Specific Gravity 1.010   Urine Blood Neg ery/uL  Urine pH 6.0   Urine Protein Neg mg/dL  Urine Urobilinogen 0.2 mg/dL  Urine Nitrites Neg   Urine Leukocyte Esterase Neg leu/uL   PROCEDURES:          Urinalysis Dipstick Dipstick Cont'd  Color: Yellow Bilirubin: Neg mg/dL  Appearance: Clear Ketones: Neg mg/dL  Specific Gravity: 1.010 Blood: Neg ery/uL  pH: 6.0 Protein: Neg mg/dL  Glucose: Neg mg/dL Urobilinogen: 0.2 mg/dL    Nitrites: Neg    Leukocyte Esterase: Neg leu/uL    ASSESSMENT:      ICD-10 Details  1 GU:   BPH w/LUTS - N40.1 Chronic, Stable   PLAN:           Orders Labs CULTURE, URINE          Schedule Return Visit/Planned Activity: Keep Scheduled Appointment          Document Letter(s):  Created for Patient: Clinical Summary         Notes:   Today, UA WNL. Precautionary culture will be sent for preop clearance, and we will follow-up  with patient as appropriate based on C&S report. Patient has been advised to stop Eliquis 48 hours prior to procedure as per cardiology clearance. He denies any interim changes to his medical history. He also denies shortness of breath, changes to baseline urological function, fever/chills, nausea/vomiting. We reviewed reasons for upcoming procedure, as well as some risks and benefits, per patient request. Continue finasteride. Stop tamsulosin. Keep surgical appointment on 10/13/2022 patient voiced understanding and is amenable to this plan.

## 2022-10-13 NOTE — Interval H&P Note (Signed)
History and Physical Interval Note:  10/13/2022 1:49 PM  Brent Mans.  has presented today for surgery, with the diagnosis of BENIGN PROSTATIC HYPERPLASIA.  The various methods of treatment have been discussed with the patient and family. After consideration of risks, benefits and other options for treatment, the patient has consented to  Procedure(s) with comments: [place] (TURP) (N/A) - 90 MINS as a surgical intervention.  The patient's history has been reviewed, patient examined, no change in status, stable for surgery.  I have reviewed the patient's chart and labs.  Questions were answered to the patient's satisfaction.     Brent Byrd D Brent Byrd

## 2022-10-13 NOTE — Transfer of Care (Signed)
Immediate Anesthesia Transfer of Care Note  Patient: Brent Byrd.  Procedure(s) Performed: TRANSURETHRAL RESECTION OF THE PROSTATE (TURP)  Patient Location: PACU  Anesthesia Type:General  Level of Consciousness: sedated, patient cooperative, and responds to stimulation  Airway & Oxygen Therapy: Patient Spontanous Breathing and Patient connected to face mask oxygen  Post-op Assessment: Report given to RN and Post -op Vital signs reviewed and stable  Post vital signs: Reviewed and stable  Last Vitals:  Vitals Value Taken Time  BP 119/88 10/13/22 1703  Temp    Pulse 71 10/13/22 1704  Resp 22 10/13/22 1704  SpO2 93 % 10/13/22 1704  Vitals shown include unvalidated device data.  Last Pain:  Vitals:   10/13/22 1244  TempSrc: Oral  PainSc: 0-No pain         Complications: No notable events documented.

## 2022-10-13 NOTE — Op Note (Signed)
Preoperative diagnosis: Bladder outlet obstruction secondary to BPH Bulbar urethral stricture  Postoperative diagnosis:  Bladder outlet obstruction secondary to BPH Bulbar urethral stricture  Procedure:  Cystoscopy Transurethral resection of the prostate Urethral dilation  Surgeon: Jacalyn Lefevre, M.D.  Anesthesia: General  Complications: None  EBL: Minimal  FINDINGS: Bulbar urethral stricture Bilobar prostatic hyperplasia No intravesical median lobe Mild trabeculation Bilateral Uos seen at start and end of case  Specimens: Prostate chips  Indication: Brent Byrd. is a patient with bladder outlet obstruction secondary to benign prostatic hyperplasia. After reviewing the management options for treatment, he elected to proceed with the above surgical procedure(s). We have discussed the potential benefits and risks of the procedure, side effects of the proposed treatment, the likelihood of the patient achieving the goals of the procedure, and any potential problems that might occur during the procedure or recuperation. Informed consent has been obtained.  Description of procedure:  The patient was taken to the operating room and general anesthesia was induced.  The patient was placed in the dorsal lithotomy position, prepped and draped in the usual sterile fashion, and preoperative antibiotics were administered. A preoperative time-out was performed.   Cystourethroscopy was performed.  A bulbar urethral stricture was encountered and was too dense to allow the scope to pass.  A 0.38 sensor wire was then used to cannulate this area.  The Optilume urethral balloon dilator was then placed over the wire today of the stricture.  It was inflated to 10 mmHg and kept in place for 5 minutes.  It was then deflated.  Cystoscopy then took place over the wire to the bladder. The bladder was then systematically examined in its entirety. There was no evidence of any bladder tumors, stones, or  other mucosal pathology.  The cystoscope was removed and the resectoscope using the visual obturator was advanced into the bladder.  The visual obturator was removed and the working element assembled with the bipolar loop. The ureteral orifices were identified and marked so as to be avoided during the procedure. The prostate adenoma was then resected utilizing loop cautery resection with the bipolar cutting loop.  The prostate adenoma from the bladder neck back to the verumontanum was resected beginning at the six o'clock position and then extended to include the right and left lobes of the prostate and anterior prostate. Care was taken not to resect distal to the verumontanum.   Hemostasis was then achieved with the cautery and the bladder was emptied and reinspected with no significant bleeding noted at the end of the procedure.    A 22Fr 3- way hgematuria catheter was then placed into the bladder.  The patient appeared to tolerate the procedure well and without complications.  The patient was able to be awakened and transferred to the recovery unit in satisfactory condition.

## 2022-10-13 NOTE — Anesthesia Preprocedure Evaluation (Addendum)
Anesthesia Evaluation  Patient identified by MRN, date of birth, ID band Patient awake    Reviewed: Allergy & Precautions, NPO status , Patient's Chart, lab work & pertinent test results  Airway Mallampati: III  TM Distance: >3 FB Neck ROM: Full    Dental no notable dental hx.    Pulmonary asthma , COPD,  COPD inhaler, Current Smoker and Patient abstained from smoking.   Pulmonary exam normal        Cardiovascular hypertension, Pt. on medications + CAD, + Past MI and + DVT  Normal cardiovascular exam  ECHO: LVEF mildly reduced at LV EF: 42%.    Neuro/Psych  Headaches PSYCHIATRIC DISORDERS  Depression    CVA, No Residual Symptoms    GI/Hepatic ,GERD  Medicated and Controlled,,(+)     substance abuse    Endo/Other  negative endocrine ROS    Renal/GU negative Renal ROS     Musculoskeletal  (+) Arthritis ,    Abdominal  (+) + obese  Peds  Hematology  (+) Blood dyscrasia (On Eliquis)   Anesthesia Other Findings BENIGN PROSTATIC HYPERPLASIA  Reproductive/Obstetrics                             Anesthesia Physical Anesthesia Plan  ASA: 3  Anesthesia Plan: General   Post-op Pain Management:    Induction: Intravenous  PONV Risk Score and Plan: 1 and Ondansetron, Dexamethasone, Midazolam and Treatment may vary due to age or medical condition  Airway Management Planned: LMA  Additional Equipment:   Intra-op Plan:   Post-operative Plan: Extubation in OR  Informed Consent: I have reviewed the patients History and Physical, chart, labs and discussed the procedure including the risks, benefits and alternatives for the proposed anesthesia with the patient or authorized representative who has indicated his/her understanding and acceptance.     Dental advisory given  Plan Discussed with: CRNA  Anesthesia Plan Comments:        Anesthesia Quick Evaluation

## 2022-10-14 ENCOUNTER — Encounter (HOSPITAL_COMMUNITY): Payer: Self-pay | Admitting: Urology

## 2022-10-14 DIAGNOSIS — N401 Enlarged prostate with lower urinary tract symptoms: Secondary | ICD-10-CM | POA: Diagnosis not present

## 2022-10-14 LAB — GLUCOSE, CAPILLARY
Glucose-Capillary: 177 mg/dL — ABNORMAL HIGH (ref 70–99)
Glucose-Capillary: 445 mg/dL — ABNORMAL HIGH (ref 70–99)

## 2022-10-14 MED ORDER — ORAL CARE MOUTH RINSE: 15.0000 mL | OROMUCOSAL | Status: DC | PRN

## 2022-10-14 MED ORDER — HYDROCODONE-ACETAMINOPHEN 5-325 MG PO TABS: 1.0000 | ORAL_TABLET | ORAL | 0 refills | Status: AC | PRN

## 2022-10-14 NOTE — Discharge Summary (Signed)
Date of admission: 10/13/2022  Date of discharge: 10/14/2022  Admission diagnosis: BPH with bladder outlet obstruction, urethral stricture  Discharge diagnosis: BPH with bladder outlet obstruction, urethral stricture  Secondary diagnoses:  Patient Active Problem List   Diagnosis Date Noted   BPH with obstruction/lower urinary tract symptoms 10/13/2022   Abnormal nuclear stress test    Headache syndrome 07/24/2020   PNA (pneumonia) 01/04/2020   COVID-19 01/04/2020   Community acquired pneumonia 10/25/2016   Acute respiratory failure with hypoxia ([place]) 10/25/2016   Rhinitis, non-allergic 10/25/2016   Dyspnea on exertion 04/13/2016   Hypotension 04/13/2016   Acute respiratory distress 04/13/2016   Substance abuse ([place])    Chest pain    COPD (chronic obstructive pulmonary disease) ([place])    Gastroesophageal reflux disease    Hypertension    Depression    Tobacco abuse    Alcohol abuse    Cigarette smoker 05/18/2015   Asthmatic bronchitis with acute exacerbation 05/17/2015    Procedures performed: Procedure(s): TRANSURETHRAL RESECTION OF THE PROSTATE (TURP)  History and Physical: For full details, please see admission history and physical. Briefly, Brent Byrd. is a 66 y.o. year old patient with BPH and bladder outlet obstruction also found to have urethral stricture in the operating room.   NAD, sitting up in bed comfortably Nonlabored respirations 22Fr 3-way foley with clear yellow urine with CBI clamped  Hospital Course: Patient tolerated the procedure well.  He was then transferred to the floor after an uneventful PACU stay.  His hospital course was uncomplicated.  On POD#1 he had met discharge criteria: was eating a regular diet, was up and ambulating independently,  pain was well controlled, catheter was draining well with CBI clamped, and was ready to for discharge.  Plan to discharge home with foley in place.   Laboratory values:  No results for input(s):  "WBC", "HGB", "HCT" in the last 72 hours. No results for input(s): "NA", "K", "CL", "CO2", "GLUCOSE", "BUN", "CREATININE", "CALCIUM" in the last 72 hours. No results for input(s): "LABPT", "INR" in the last 72 hours. No results for input(s): "LABURIN" in the last 72 hours. Results for orders placed or performed during the hospital encounter of 01/04/20  Blood Culture (routine x 2)     Status: None   Collection Time: 01/04/20 11:00 AM   Specimen: BLOOD RIGHT ARM  Result Value Ref Range Status   Specimen Description BLOOD RIGHT ARM  Final   Special Requests   Final    BOTTLES DRAWN AEROBIC AND ANAEROBIC Blood Culture adequate volume   Culture   Final    NO GROWTH 5 DAYS Performed at [facility], [address], [place], [place] [address]    Report Status 01/09/2020 FINAL  Final  Respiratory Panel by RT PCR (Flu A&B, Covid) - Nasopharyngeal Swab     Status: Abnormal   Collection Time: 01/04/20 11:49 AM   Specimen: Nasopharyngeal Swab  Result Value Ref Range Status   SARS Coronavirus 2 by RT PCR POSITIVE (A) NEGATIVE Final    Comment: RESULT CALLED TO, READ BACK BY AND VERIFIED WITH: Merita Norton RN 14:50 01/04/20 ([email or web address]) (NOTE) SARS-CoV-2 target nucleic acids are DETECTED. SARS-CoV-2 RNA is generally detectable in upper respiratory specimens  during the acute phase of infection. Positive results are indicative of the presence of the identified virus, but do not rule out bacterial infection or co-infection with other pathogens not detected by the test. Clinical correlation with patient history and other diagnostic information is necessary  to determine patient infection status. The expected result is Negative. Fact Sheet for Patients:  PinkCheek.be Fact Sheet for Healthcare Providers: GravelBags.it This test is not yet approved or cleared by the [place] FDA and  has been authorized for detection and/or diagnosis of  SARS-CoV-2 by FDA under an Emergency Use Authorization (EUA).  This EUA will remain in effect (meaning this test can be used) fo r the duration of  the COVID-19 declaration under Section 564(b)(1) of the Act, 21 U.S.C. section 360bbb-3(b)(1), unless the authorization is terminated or revoked sooner.    Influenza A by PCR NEGATIVE NEGATIVE Final   Influenza B by PCR NEGATIVE NEGATIVE Final    Comment: (NOTE) The Xpert Xpress SARS-CoV-2/FLU/RSV assay is intended as an aid in  the diagnosis of influenza from Nasopharyngeal swab specimens and  should not be used as a sole basis for treatment. Nasal washings and  aspirates are unacceptable for Xpert Xpress SARS-CoV-2/FLU/RSV  testing. Fact Sheet for Patients: PinkCheek.be Fact Sheet for Healthcare Providers: GravelBags.it This test is not yet approved or cleared by the [place] FDA and  has been authorized for detection and/or diagnosis of SARS-CoV-2 by  FDA under an Emergency Use Authorization (EUA). This EUA will remain  in effect (meaning this test can be used) for the duration of the  Covid-19 declaration under Section 564(b)(1) of the Act, 21  U.S.C. section 360bbb-3(b)(1), unless the authorization is  terminated or revoked. Performed at [facility], [place] [address] [place], [place] [address]   Blood Culture (routine x 2)     Status: None   Collection Time: 01/04/20 12:49 PM   Specimen: BLOOD RIGHT HAND  Result Value Ref Range Status   Specimen Description BLOOD RIGHT HAND  Final   Special Requests   Final    BOTTLES DRAWN AEROBIC AND ANAEROBIC Blood Culture adequate volume   Culture   Final    NO GROWTH 5 DAYS Performed at [facility], [place] [address] [place], [place] [address]    Report Status 01/09/2020 FINAL  Final    Disposition: Home  Discharge instruction: The patient was instructed to be ambulatory but told to refrain from heavy lifting,  strenuous activity, or driving.   Discharge medications:  Allergies as of 10/14/2022       Reactions   Iodine Hives, Other (See Comments)   Omnipaque [iohexol] Other (See Comments)   IV Dye-Sneezing        Medication List     TAKE these medications    albuterol (2.5 MG/3ML) 0.083% nebulizer solution Commonly known as: PROVENTIL Take 2.5 mg by nebulization every 6 (six) hours as needed for wheezing or shortness of breath.   amLODipine 5 MG tablet Commonly known as: NORVASC Take 5 mg by mouth daily.   aspirin EC 81 MG tablet Take 1 tablet (81 mg total) by mouth daily. Swallow whole.   Breztri Aerosphere 160-9-4.8 MCG/ACT Aero Generic drug: Budeson-Glycopyrrol-Formoterol Inhale 2 puffs into the lungs 2 (two) times daily.   Eliquis 5 MG Tabs tablet Generic drug: apixaban Take 5 mg by mouth 2 (two) times daily.   hydrochlorothiazide 12.5 MG capsule Commonly known as: MICROZIDE TAKE 1 CAPSULE BY MOUTH EVERY DAY   HYDROcodone-acetaminophen 5-325 MG tablet Commonly known as: NORCO/VICODIN Take 1 tablet by mouth every 4 (four) hours as needed for moderate pain.   metFORMIN 500 MG tablet Commonly known as: GLUCOPHAGE Take 500 mg by mouth 2 (two) times daily with a meal.  nitroGLYCERIN 0.4 MG SL tablet Commonly known as: Nitrostat Place 1 tablet (0.4 mg total) under the tongue every 5 (five) minutes as needed for chest pain. If you require more than two tablets five minutes apart go to the nearest ER via EMS.   omeprazole 20 MG capsule Commonly known as: PRILOSEC Take 20 mg by mouth daily.   rosuvastatin 20 MG tablet Commonly known as: CRESTOR TAKE 1 TABLET BY MOUTH EVERYDAY AT BEDTIME   Voltaren Arthritis Pain 1 % Gel Generic drug: diclofenac Sodium Apply 2 g topically 4 (four) times daily as needed (pain).        Followup:   Follow-up Information     [facility] Follow up on 10/21/2022.   Why: You will be contacted for an appointment  on 12/27 Contact information: [place] [phone]

## 2022-10-14 NOTE — Anesthesia Postprocedure Evaluation (Signed)
Anesthesia Post Note  Patient: Brent Byrd.  Procedure(s) Performed: TRANSURETHRAL RESECTION OF THE PROSTATE (TURP)     Patient location during evaluation: PACU Anesthesia Type: General Level of consciousness: awake and alert Pain management: pain level controlled Vital Signs Assessment: post-procedure vital signs reviewed and stable Respiratory status: spontaneous breathing, nonlabored ventilation, respiratory function stable and patient connected to nasal cannula oxygen Cardiovascular status: blood pressure returned to baseline and stable Postop Assessment: no apparent nausea or vomiting Anesthetic complications: no   No notable events documented.  Last Vitals:  Vitals:   10/14/22 1009 10/14/22 1029  BP: 112/72 135/75  Pulse: 77   Resp: 18   Temp: 36.4 C   SpO2: 95%     Last Pain:  Vitals:   10/14/22 1009  TempSrc: Oral  PainSc:                  March Rummage [place]

## 2022-10-14 NOTE — TOC Initial Note (Signed)
Transition of Care (TOC) - Initial/Assessment Note    Patient Details  Name: Brent Byrd. MRN: [id number] Date of Birth: 10-17-1956  [facility] CM/SW Contact:    Leeroy Cha, RN Phone Number: 10/14/2022, 9:17 AM  Clinical Narrative:                 Patient admitted and dcd to return home with no toc needs.  Expected Discharge Plan: Home/Self Care Barriers to Discharge: Barriers Resolved   Patient Goals and CMS Choice Patient states their goals for this hospitalization and ongoing recovery are:: i just want to go home CMS Medicare.gov Compare Post Acute Care list provided to:: Patient Choice offered to / list presented to : Patient  Expected Discharge Plan and Services Expected Discharge Plan: Home/Self Care   Discharge Planning Services: CM Consult   Living arrangements for the past 2 months: Single Family Home Expected Discharge Date: 10/14/22                                    Prior Living Arrangements/Services Living arrangements for the past 2 months: Single Family Home Lives with:: Self Patient language and need for interpreter reviewed:: Yes Do you feel safe going back to the place where you live?: Yes            Criminal Activity/Legal Involvement Pertinent to Current Situation/Hospitalization: No - Comment as needed  Activities of Daily Living Home Assistive Devices/Equipment: CBG Meter, Blood pressure cuff, Nebulizer, Dentures (specify type) (partial top and bottom) ADL Screening (condition at time of admission) Patient's cognitive ability adequate to safely complete daily activities?: Yes Is the patient deaf or have difficulty hearing?: No Does the patient have difficulty seeing, even when wearing glasses/contacts?: No Does the patient have difficulty concentrating, remembering, or making decisions?: No Patient able to express need for assistance with ADLs?: Yes Does the patient have difficulty dressing or bathing?:  No Independently performs ADLs?: Yes (appropriate for developmental age) Does the patient have difficulty walking or climbing stairs?: No Weakness of Legs: None Weakness of Arms/Hands: None  Permission Sought/Granted                  Emotional Assessment Appearance:: Appears stated age   Affect (typically observed): Calm Orientation: : Oriented to Self, Oriented to Place, Oriented to  Time, Oriented to Situation Alcohol / Substance Use: Not Applicable Psych Involvement: No (comment)  Admission diagnosis:  BPH with obstruction/lower urinary tract symptoms [N40.1, N13.8] Patient Active Problem List   Diagnosis Date Noted   BPH with obstruction/lower urinary tract symptoms 10/13/2022   Abnormal nuclear stress test    Headache syndrome 07/24/2020   PNA (pneumonia) 01/04/2020   COVID-19 01/04/2020   Community acquired pneumonia 10/25/2016   Acute respiratory failure with hypoxia ([place]) 10/25/2016   Rhinitis, non-allergic 10/25/2016   Dyspnea on exertion 04/13/2016   Hypotension 04/13/2016   Acute respiratory distress 04/13/2016   Substance abuse ([place])    Chest pain    COPD (chronic obstructive pulmonary disease) ([place])    Gastroesophageal reflux disease    Hypertension    Depression    Tobacco abuse    Alcohol abuse    Cigarette smoker 05/18/2015   Asthmatic bronchitis with acute exacerbation 05/17/2015   PCP:  Nolene Ebbs, MD Pharmacy:   [id number] [address]Phone: [phone]  Fax: [place] [address] [place] [address] Phone: [phone] Fax: [phone]     Social Determinants of Health (SDOH) Interventions    Readmission Risk Interventions   No data to display

## 2022-10-15 LAB — SURGICAL PATHOLOGY

## 2022-12-02 ENCOUNTER — Encounter: Payer: Self-pay | Admitting: Internal Medicine

## 2022-12-02 ENCOUNTER — Ambulatory Visit: Payer: Medicaid Other | Admitting: Internal Medicine

## 2022-12-02 VITALS — BP 111/75 | HR 71 | Resp 16 | Ht 76.0 in | Wt 285.0 lb

## 2022-12-02 DIAGNOSIS — Z9889 Other specified postprocedural states: Secondary | ICD-10-CM

## 2022-12-02 DIAGNOSIS — I10 Essential (primary) hypertension: Secondary | ICD-10-CM

## 2022-12-02 NOTE — Progress Notes (Signed)
ID:  [name], DOB 1956-07-12, MRN [id number]  PCP:  Nolene Ebbs, MD  Cardiologist:  Floydene Flock, DO, [facility] (established care 03/26/2022)  Date: 12/02/22 Last Office Visit: 03/26/2022  Chief Complaint  Patient presents with   post surgery    cath   Results   HPI  Brent Clemente. is a 67 y.o. African-American male who presents to the clinic for evaluation of preoperative risk stratification at the request of Nolene Ebbs, MD. His past medical history and cardiovascular risk factors include: Hypertension, COPD, hyperlipidemia, smoker, Hx alcohol abuse, history of DVT, history of stroke with left foot drop, non-insulin-dependent diabetes.  Patient is here for follow-up visit. He has been doing well. No complaints or concerns. He is tolerating his medications without issues. Denies chest pain, shortness of breath, palpitations, diaphoresis, syncope, edema, PND, orthopnea.    FUNCTIONAL STATUS: Before the left knee pain would walk 2 miles w/ his daughter. Last occurrence atleast 1.5 months ago.    ALLERGIES: Allergies  Allergen Reactions   Iodine Hives and Other (See Comments)   Omnipaque [Iohexol] Other (See Comments)    IV Dye-Sneezing    MEDICATION LIST PRIOR TO VISIT: Current Meds  Medication Sig   albuterol (PROVENTIL) (2.5 MG/3ML) 0.083% nebulizer solution Take 2.5 mg by nebulization every 6 (six) hours as needed for wheezing or shortness of breath.   amLODipine (NORVASC) 5 MG tablet Take 5 mg by mouth daily.   aspirin EC 81 MG tablet Take 1 tablet (81 mg total) by mouth daily. Swallow whole.   Budeson-Glycopyrrol-Formoterol (BREZTRI AEROSPHERE) 160-9-4.8 MCG/ACT AERO Inhale 2 puffs into the lungs 2 (two) times daily.   cetirizine (ZYRTEC) 10 MG tablet Take 10 mg by mouth daily as needed.   diclofenac Sodium (VOLTAREN ARTHRITIS PAIN) 1 % GEL Apply 2 g topically 4 (four) times daily as needed (pain).   ELIQUIS 5 MG TABS tablet Take 5 mg by mouth 2 (two) times  daily.   fluticasone (FLONASE) 50 MCG/ACT nasal spray Place 2 sprays into both nostrils daily.   hydrochlorothiazide (MICROZIDE) 12.5 MG capsule TAKE 1 CAPSULE BY MOUTH EVERY DAY   HYDROcodone-acetaminophen (NORCO/VICODIN) 5-325 MG tablet Take 1 tablet by mouth every 4 (four) hours as needed for moderate pain.   JARDIANCE 10 MG TABS tablet Take 10 mg by mouth daily.   metFORMIN (GLUCOPHAGE) 500 MG tablet Take 500 mg by mouth 2 (two) times daily with a meal.   nitroGLYCERIN (NITROSTAT) 0.4 MG SL tablet Place 1 tablet (0.4 mg total) under the tongue every 5 (five) minutes as needed for chest pain. If you require more than two tablets five minutes apart go to the nearest ER via EMS.   omeprazole (PRILOSEC) 20 MG capsule Take 20 mg by mouth daily.   rosuvastatin (CRESTOR) 20 MG tablet TAKE 1 TABLET BY MOUTH EVERYDAY AT BEDTIME     PAST MEDICAL HISTORY: Past Medical History:  Diagnosis Date   Alcohol abuse    history of   Arthritis    Left knee, lower back   Chest pain    hx   COPD (chronic obstructive pulmonary disease) ([place])    "[place] said I don't have this" (10/26/2016)   Depression    no meds   DVT (deep venous thrombosis) ([place]) 2008   Right leg- txed with coumadin    DVT (deep venous thrombosis) ([place]) 2010; 2017   RLE; RLE   Gastroesophageal reflux disease 2014   Hypertension    MI, old ~  2007   Pneumonia    Pre-diabetes    Stroke [facility]) 01/2012   denies residual on 10/26/2016   Substance abuse ([place])    Alcohol dependence Hx   Tobacco abuse     PAST SURGICAL HISTORY: Past Surgical History:  Procedure Laterality Date   GANGLION CYST EXCISION Left 2000   wrist   HAMMER TOE SURGERY Left 07/2012   2nd toe/08/05/2012   LEFT HEART CATH AND CORONARY ANGIOGRAPHY N/A 05/19/2022   Procedure: LEFT HEART CATH AND CORONARY ANGIOGRAPHY;  Surgeon: [name], MD;  Location: [facility];  Service: Cardiovascular;  Laterality: N/A;   LUMBAR DISC SURGERY  06/2007   Herniated  disc/notes 02/25/2011   TRANSURETHRAL RESECTION OF PROSTATE N/A 10/13/2022   Procedure: TRANSURETHRAL RESECTION OF THE PROSTATE (TURP);  Surgeon: Robley Fries, MD;  Location: [facility];  Service: Urology;  Laterality: N/A;  90 MINS   WISDOM TOOTH EXTRACTION      FAMILY HISTORY: The patient family history includes Asthma in his mother; Cancer in his father and sister; Diabetes in his sister; Thrombosis in his sister.  SOCIAL HISTORY:  The patient  reports that he has been smoking cigarettes. He has a 11.25 pack-year smoking history. He has never used smokeless tobacco. He reports that he does not currently use alcohol after a past usage of about 1.0 standard drink of alcohol per week. He reports that he does not currently use drugs after having used the following drugs: "Crack" cocaine and Cocaine.  REVIEW OF SYSTEMS: Review of Systems  Constitutional: Positive for weight gain.  Cardiovascular:  Positive for dyspnea on exertion (Chronic and stable). Negative for chest pain, claudication, irregular heartbeat, leg swelling, near-syncope, orthopnea, palpitations, paroxysmal nocturnal dyspnea and syncope.  Respiratory:  Positive for shortness of breath (Chronic and stable).   Hematologic/Lymphatic: Negative for bleeding problem.  Musculoskeletal:  Positive for arthritis and joint pain. Negative for muscle cramps and myalgias.  Neurological:  Negative for dizziness and light-headedness.    PHYSICAL EXAM:    12/02/2022   10:07 AM 10/14/2022   10:29 AM 10/14/2022   10:09 AM  Vitals with BMI  Height 6' 4"$     Weight 285 lbs    BMI 123456    Systolic [id number]  Diastolic 75 75 72  Pulse 71  77   CONSTITUTIONAL: Appears older than stated age, hemodynamically stable, no acute distress.  SKIN: Skin is warm and dry. No rash noted. No cyanosis. No pallor. No jaundice HEAD: Normocephalic and atraumatic.  EYES: No scleral icterus MOUTH/THROAT: Moist oral membranes.  NECK: No JVD present. No  thyromegaly noted. No carotid bruits  CHEST Normal respiratory effort. No intercostal retractions  LUNGS: Clear to auscultation bilaterally.  No stridor. No wheezes. No rales.  CARDIOVASCULAR: Regular rate and rhythm, positive S1-S2, no murmurs rubs or gallops appreciated. ABDOMINAL: Obese, soft, nontender, nondistended, positive bowel sounds in all 4 quadrants, no apparent ascites.  EXTREMITIES: Trace bilateral peripheral edema, warm to touch, left knee brace  HEMATOLOGIC: No significant bruising NEUROLOGIC: Oriented to person, place, and time. Nonfocal. Normal muscle tone.  PSYCHIATRIC: Normal mood and affect. Normal behavior. Cooperative No change in physical examination since last office visit  CARDIAC DATABASE: EKG: 03/26/2022: NSR, 61 bpm, consider old anteroseptal infarct, diffuse nonspecific T wave abnormality.  06/08/2022: NSR, 61 bpm, nonspecific T wave abnormality, without underlying injury pattern.  Echocardiogram: 04/06/2022:  Left ventricle cavity is normal in size. Mild concentric hypertrophy of the left ventricle. Normal global wall  motion. Normal LV systolic function with EF 58%. Doppler evidence of grade I (impaired) diastolic dysfunction, normal LAP.  The aortic root is dilated, maximal dimension 4.4 cm at sinotubular junction.  Structurally normal trileaflet aortic valve.  Mild (Grade I) aortic regurgitation.  Mild to moderate mitral regurgitation.  Normal right atrial pressure.  Stress Testing: Lexiscan Nuclear stress test 04/13/2022: Nondiagnostic ECG stress. The heart rate response was consistent with Regadenoson.  Myocardial perfusion is abnormal.  Mild degree large extent perfusion defect consistent with moderate ischemia located in the apical anterior wall, apical lateral wall, mid inferolateral wall and basal inferolateral wall (Left Anterior Descending Artery and Left Circumflex Artery regions) of left ventricle. LV is dilated in both rest and stress, more dilated  in stress although calculated TID index is normal. Overall LV systolic function is abnormal with inferior and inferolateral akinesis and global hypokinesis.   Stress LVEF mildly reduced at LV EF: 42%.  No previous exam available for comparison. High risk and multivessel disease cannot be excluded.  Heart Catheterization: Left Heart Catheterization 05/19/22:  LV: Not performed. LM: Large caliber vessel, bifurcates into very very large circumflex and LAD. CX: Marked generalized arteriomegaly noted, appeared to measure at least 8 to 9 mm in size in the proximal and mid segment with severe tortuosity.  Proximal circumflex has at most a 20 to 30% stenosis.  Slow flow is evident throughout the circumflex coronary artery.  It is a dominant vessel and gives origin to 2 large marginals and PDA branches. LAD: Large vessel.  Generalized arteriomegaly noted again.  Gives origin to large D2.  Small D1.  No significant disease. RCA: Appears very small and nondominant.  Unable to cannulate the vessel in view of large body size of the patient and catheter is unable to reach and also using long catheters, still unable to engage the right coronary artery.  Eventually procedure abandoned due to severe spasm noted in the subclavian and innominate arteries with difficulty to talk to devices.  Recommendation: His nuclear stress is abnormal due to endothelial dysfunction.  There is clearly severe tortuosity of a very large and dominant circumflex coronary artery with slow flow.  Suspect endothelial dysfunction.  If high suspicion for CAD, could consider coronary CTA.  But the right coronary appears small.  Nonselective injection.  Smoking cessation, optimization of medical therapy for hypertension and hyperlipidemia and weight loss would be appropriate.  From surgical standpoint, he can be taken up for the upcoming surgery with low risk.  130 mL contrast utilized.   LABORATORY DATA: External Labs: Collected: December 24, 2021 available in Care Everywhere, [facility] health Hemoglobin A1c 6.5 Sodium 137, potassium 4.4, chloride 105, bicarb 27.5. BUN 11, creatinine 0.67. eGFR >60. AST 20, ALT 17, alkaline phosphatase 60 Total cholesterol 175, triglycerides 69, HDL 66, LDL 95, non-HDL 109  IMPRESSION:    ICD-10-CM   1. Status post left heart catheterization  Z98.890 EKG 12-Lead        RECOMMENDATIONS: Brent Pelley. is a 67 y.o. African-American male whose past medical history and cardiac risk factors include: Hypertension, COPD, hyperlipidemia, smoker, Hx alcohol abuse, history of DVT, history of stroke with left foot drop, non-insulin-dependent diabetes.   Hypertension Continue current cardiac medications. Encourage low-sodium diet, less than 2000 mg daily.  Post-op evaluation Patient doing very well Follow-up with Dr Terri Skains   FINAL MEDICATION LIST END OF ENCOUNTER: No orders of the defined types were placed in this encounter.   There are no discontinued medications.  Current Outpatient Medications:    albuterol (PROVENTIL) (2.5 MG/3ML) 0.083% nebulizer solution, Take 2.5 mg by nebulization every 6 (six) hours as needed for wheezing or shortness of breath., Disp: , Rfl:    amLODipine (NORVASC) 5 MG tablet, Take 5 mg by mouth daily., Disp: , Rfl:    aspirin EC 81 MG tablet, Take 1 tablet (81 mg total) by mouth daily. Swallow whole., Disp: 30 tablet, Rfl: 12   Budeson-Glycopyrrol-Formoterol (BREZTRI AEROSPHERE) 160-9-4.8 MCG/ACT AERO, Inhale 2 puffs into the lungs 2 (two) times daily., Disp: , Rfl:    cetirizine (ZYRTEC) 10 MG tablet, Take 10 mg by mouth daily as needed., Disp: , Rfl:    diclofenac Sodium (VOLTAREN ARTHRITIS PAIN) 1 % GEL, Apply 2 g topically 4 (four) times daily as needed (pain)., Disp: , Rfl:    ELIQUIS 5 MG TABS tablet, Take 5 mg by mouth 2 (two) times daily., Disp: , Rfl:    fluticasone (FLONASE) 50 MCG/ACT nasal spray, Place 2 sprays into both nostrils daily., Disp: , Rfl:     hydrochlorothiazide (MICROZIDE) 12.5 MG capsule, TAKE 1 CAPSULE BY MOUTH EVERY DAY, Disp: 90 capsule, Rfl: 0   HYDROcodone-acetaminophen (NORCO/VICODIN) 5-325 MG tablet, Take 1 tablet by mouth every 4 (four) hours as needed for moderate pain., Disp: 20 tablet, Rfl: 0   JARDIANCE 10 MG TABS tablet, Take 10 mg by mouth daily., Disp: , Rfl:    metFORMIN (GLUCOPHAGE) 500 MG tablet, Take 500 mg by mouth 2 (two) times daily with a meal., Disp: , Rfl:    nitroGLYCERIN (NITROSTAT) 0.4 MG SL tablet, Place 1 tablet (0.4 mg total) under the tongue every 5 (five) minutes as needed for chest pain. If you require more than two tablets five minutes apart go to the nearest ER via EMS., Disp: 30 tablet, Rfl: 0   omeprazole (PRILOSEC) 20 MG capsule, Take 20 mg by mouth daily., Disp: , Rfl:    rosuvastatin (CRESTOR) 20 MG tablet, TAKE 1 TABLET BY MOUTH EVERYDAY AT BEDTIME, Disp: 90 tablet, Rfl: 0  Orders Placed This Encounter  Procedures   EKG 12-Lead      Brent Byrd [facility]  Pager: [phone] Office: [phone]

## 2022-12-12 ENCOUNTER — Other Ambulatory Visit: Payer: Self-pay | Admitting: Cardiology

## 2022-12-12 DIAGNOSIS — I10 Essential (primary) hypertension: Secondary | ICD-10-CM

## 2022-12-12 DIAGNOSIS — E119 Type 2 diabetes mellitus without complications: Secondary | ICD-10-CM

## 2022-12-12 DIAGNOSIS — R0602 Shortness of breath: Secondary | ICD-10-CM

## 2022-12-12 DIAGNOSIS — R931 Abnormal findings on diagnostic imaging of heart and coronary circulation: Secondary | ICD-10-CM

## 2022-12-21 ENCOUNTER — Other Ambulatory Visit: Payer: Self-pay | Admitting: Cardiology

## 2022-12-21 DIAGNOSIS — R931 Abnormal findings on diagnostic imaging of heart and coronary circulation: Secondary | ICD-10-CM

## 2022-12-25 ENCOUNTER — Encounter: Payer: Self-pay | Admitting: Gastroenterology

## 2023-02-07 ENCOUNTER — Encounter (HOSPITAL_COMMUNITY): Payer: Self-pay | Admitting: Pharmacy Technician

## 2023-02-07 ENCOUNTER — Other Ambulatory Visit: Payer: Self-pay

## 2023-02-07 ENCOUNTER — Emergency Department (HOSPITAL_COMMUNITY): Payer: Medicaid Other

## 2023-02-07 ENCOUNTER — Emergency Department (HOSPITAL_COMMUNITY)
Admission: EM | Admit: 2023-02-07 | Discharge: 2023-02-07 | Disposition: A | Discharge status: Home / Self Care | Payer: Medicaid Other | Attending: Emergency Medicine | Admitting: Emergency Medicine

## 2023-02-07 DIAGNOSIS — W109XXA Fall (on) (from) unspecified stairs and steps, initial encounter: Secondary | ICD-10-CM | POA: Diagnosis not present

## 2023-02-07 DIAGNOSIS — S82841A Displaced bimalleolar fracture of right lower leg, initial encounter for closed fracture: Secondary | ICD-10-CM | POA: Diagnosis not present

## 2023-02-07 DIAGNOSIS — M79604 Pain in right leg: Secondary | ICD-10-CM | POA: Diagnosis present

## 2023-02-07 MED ORDER — ACETAMINOPHEN 500 MG PO TABS: 500.0000 mg | ORAL_TABLET | Freq: Four times a day (QID) | ORAL | 0 refills | Status: AC | PRN

## 2023-02-07 MED ORDER — ACETAMINOPHEN 500 MG PO TABS
1000.0000 mg | ORAL_TABLET | Freq: Once | ORAL | Status: AC
  Administered 2023-02-07: 1000 mg via ORAL
  Filled 2023-02-07: qty 2

## 2023-02-07 MED ORDER — OXYCODONE HCL 5 MG PO TABS: 5.0000 mg | ORAL_TABLET | Freq: Four times a day (QID) | ORAL | 0 refills | Status: AC | PRN

## 2023-02-07 MED ORDER — OXYCODONE HCL 5 MG PO TABS
5.0000 mg | ORAL_TABLET | Freq: Once | ORAL | Status: AC
  Administered 2023-02-07: 5 mg via ORAL
  Filled 2023-02-07: qty 1

## 2023-02-07 NOTE — ED Provider Notes (Signed)
[place] EMERGENCY DEPARTMENT AT [facility] Provider Note   CSN: [id number] Arrival date & time: 02/07/23  4098     History  Chief Complaint  Patient presents with   Leg Injury    Brent Byrd. is a 67 y.o. male presenting today after falling down the stairs last night.  He reports that he has severe pain in the right ankle and right leg.  No previous surgeries or injuries to this limb.  Did not hit his head.   HPI     Home Medications Prior to Admission medications   Medication Sig Start Date End Date Taking? Authorizing Provider  albuterol (PROVENTIL) (2.5 MG/3ML) 0.083% nebulizer solution Take 2.5 mg by nebulization every 6 (six) hours as needed for wheezing or shortness of breath. 08/25/22   [provider]  amLODipine (NORVASC) 5 MG tablet Take 5 mg by mouth daily.    [provider]  aspirin EC 81 MG tablet Take 1 tablet (81 mg total) by mouth daily. Swallow whole. 05/08/22   Tolia, Sunit, DO  Budeson-Glycopyrrol-Formoterol (BREZTRI AEROSPHERE) 160-9-4.8 MCG/ACT AERO Inhale 2 puffs into the lungs 2 (two) times daily.    [provider]  cetirizine (ZYRTEC) 10 MG tablet Take 10 mg by mouth daily as needed. 11/10/22   [provider]  diclofenac Sodium (VOLTAREN ARTHRITIS PAIN) 1 % GEL Apply 2 g topically 4 (four) times daily as needed (pain).    [provider]  ELIQUIS 5 MG TABS tablet Take 5 mg by mouth 2 (two) times daily. 12/24/21   [provider]  fluticasone (FLONASE) 50 MCG/ACT nasal spray Place 2 sprays into both nostrils daily. 11/10/22   [provider]  hydrochlorothiazide (MICROZIDE) 12.5 MG capsule TAKE 1 CAPSULE BY MOUTH EVERY DAY 12/14/22   Tolia, Sunit, DO  HYDROcodone-acetaminophen (NORCO/VICODIN) 5-325 MG tablet Take 1 tablet by mouth every 4 (four) hours as needed for moderate pain. 10/14/22 10/14/23  Noel Christmas, MD  JARDIANCE 10 MG TABS tablet Take 10 mg by mouth daily.  10/13/22   [provider]  metFORMIN (GLUCOPHAGE) 500 MG tablet Take 500 mg by mouth 2 (two) times daily with a meal.    [provider]  nitroGLYCERIN (NITROSTAT) 0.4 MG SL tablet PLACE 1 TABLET (0.4 MG TOTAL) UNDER THE TONGUE EVERY 5 (FIVE) MINUTES AS NEEDED FOR CHEST PAIN. IF YOU REQUIRE MORE THAN TWO TABLETS FIVE MINUTES APART GO TO THE NEAREST ER VIA EMS. 12/21/22 01/20/23  Tessa Lerner, DO  omeprazole (PRILOSEC) 20 MG capsule Take 20 mg by mouth daily.    [provider]  rosuvastatin (CRESTOR) 20 MG tablet TAKE 1 TABLET BY MOUTH EVERYDAY AT BEDTIME 12/14/22   Tolia, Sunit, DO  famotidine (PEPCID) 20 MG tablet Take 1 tablet (20 mg total) by mouth 2 (two) times daily for 10 days. 01/08/20 08/22/20  Elgergawy, Leana Roe, MD      Allergies    Iodine and Omnipaque [iohexol]    Review of Systems   Review of Systems  Physical Exam Updated Vital Signs BP 128/87   Pulse 78   Temp 98.1 F (36.7 C)   Resp 18   SpO2 92%  Physical Exam Vitals and nursing note reviewed.  Constitutional:      Appearance: Normal appearance.  HENT:     Head: Normocephalic and atraumatic.  Eyes:     General: No scleral icterus.    Conjunctiva/sclera: Conjunctivae normal.  Pulmonary:     Effort: Pulmonary effort  is normal. No respiratory distress.  Musculoskeletal:     Comments: No obvious deformity.  Still able to range the right ankle.  Strong DP pulse.  Full range of motion of the MTPs, no sensation deficit.  No abrasions, lacerations or tenting.  Skin:    Findings: No rash.  Neurological:     Mental Status: He is alert.  Psychiatric:        Mood and Affect: Mood normal.     ED Results / Procedures / Treatments   Labs (all labs ordered are listed, but only abnormal results are displayed) Labs Reviewed - No data to display  EKG None  Radiology CT Ankle Right Wo Contrast  Result Date: 02/07/2023 CLINICAL DATA:  Right ankle injury. EXAM: CT OF THE RIGHT ANKLE WITHOUT  CONTRAST TECHNIQUE: Multidetector CT imaging of the right ankle was performed according to the standard protocol. Multiplanar CT image reconstructions were also generated. RADIATION DOSE REDUCTION: This exam was performed according to the departmental dose-optimization program which includes automated exposure control, adjustment of the mA and/or kV according to patient size and/or use of iterative reconstruction technique. COMPARISON:  Right ankle x-rays from same day. FINDINGS: Bones/Joint/Cartilage Acute minimally displaced transverse fracture through the medial malleolus with up to 4 mm of distraction. Acute oblique fracture of the distal fibular metaphysis with 7 mm posterior displacement and 4 mm lateral displacement. Tiny avulsion fracture of the posterior malleolus (series 8, image 56). Slight lateral subluxation of the talar dome with respect to the tibial plafond. No dislocation. The talar dome is intact. Dorsal degenerative spurring of the talonavicular joint. No joint effusion. Ligaments Ligaments are suboptimally evaluated by CT. Muscles and Tendons Grossly intact. Soft tissue Diffuse soft tissue swelling. No fluid collection or hematoma. No soft tissue mass. IMPRESSION: 1. Acute bimalleolar fracture-subluxation as described above. Additional tiny avulsion fracture of the posterior malleolus. Electronically Signed   By: Obie Dredge M.D.   On: 02/07/2023 12:10   DG Ankle Right Port  Result Date: 02/07/2023 CLINICAL DATA:  67 year old male status post twisting injury and fall last night. Increased pain and swelling. EXAM: PORTABLE RIGHT ANKLE - 2 VIEW COMPARISON:  Right tib fib series today reported separately. Previous right ankle series 09/27/2010. FINDINGS: Oblique fracture of the distal right fibula metadiaphysis extending to the level of the joint space, talar dome. Distal fragment including the lateral malleolus is mildly overriding and posteriorly displaced. Abnormal widening of the  tibiofibular syndesmosis. Evidence of a 6-7 mm fracture or chronic os effect fragment which appears to be in the anterolateral joint space. Superimposed transverse fracture of the medial malleolus, minimally displaced. Abnormal widening of the medial joint space. No definite posterior malleolus fracture. The talar dome and calcaneus also appear intact. Grossly intact visible bones of the right foot. IMPRESSION: 1. Acute right ankle bimalleolar fracture with lateral subluxation. Associated abnormal widening of the tibiofibular syndesmosis. Small acute versus chronic 6-7 mm bone fragment at the anterolateral joint space. 2. No definite posterior malleolus, talus, or calcaneus fracture. Electronically Signed   By: Odessa Fleming M.D.   On: 02/07/2023 11:02   DG Tibia/Fibula Right  Result Date: 02/07/2023 CLINICAL DATA:  67 year old male status post twisting injury and fall last night. Increased pain and swelling. EXAM: RIGHT TIBIA AND FIBULA - 2 VIEW COMPARISON:  Previous right tib fib series 08/17/2016. FINDINGS: Four portable views of the right tibia and fibula. Bimalleolar fractures at the ankle, see dedicated right ankle series today reported separately. The more proximal right  tibia and fibula appears stable since 2017 and intact. Stable alignment at the right knee. Progressed soft tissue calcifications in the thigh and calf might be vascular or dystrophic. No soft tissue gas. Soft tissue swelling most pronounced near the ankle. IMPRESSION: 1. Right ankle fractures, detailed separately. 2. No other acute fracture or dislocation identified about the right tib fib. 3. Progression of probable peripheral vascular calcification since 2017. Electronically Signed   By: Odessa Fleming M.D.   On: 02/07/2023 10:59    Procedures Procedures   Medications Ordered in ED Medications  acetaminophen (TYLENOL) tablet 1,000 mg (1,000 mg Oral Given 02/07/23 1103)  oxyCODONE (Oxy IR/ROXICODONE) immediate release tablet 5 mg (5 mg Oral  Given 02/07/23 1200)    ED Course/ Medical Decision Making/ A&P                             Medical Decision Making Amount and/or Complexity of Data Reviewed Radiology: ordered.  Risk OTC drugs. Prescription drug management.   67 year old presenting with an ankle injury.  Neurovascularly intact.  Did not hit his head, no other injuries.  X-ray ordered, viewed and interpreted by me.  I agree with radiology that there are appears to be a right bimall fracture.  Images very well aligned.  Treatment: Tylenol and subsequently oxycodone after x-ray  Spoke with Dr, Odis Hollingshead with orthopedics.  He recommends a posterior stirrup splint, nonweightbearing, crutches and CT prior to discharge.  Likely will require surgery, patient will make an appointment with them on Monday.  He is agreeable.  Ortho tech at bedside.  Patient will be discharged at this time in neurovascularly intact condition    Final Clinical Impression(s) / ED Diagnoses Final diagnoses:  Closed bimalleolar fracture of right ankle, initial encounter    Rx / DC Orders ED Discharge Orders          Ordered    oxyCODONE (ROXICODONE) 5 MG immediate release tablet  Every 6 hours PRN        02/07/23 1216    acetaminophen (TYLENOL) 500 MG tablet  Every 6 hours PRN        02/07/23 1216           Results and diagnoses were explained to the patient. Return precautions discussed in full. Patient had no additional questions and expressed complete understanding.   This chart was dictated using voice recognition software.  Despite best efforts to proofread,  errors can occur which can change the documentation meaning.    Woodroe Chen 02/07/23 1224    Mardene Sayer, MD 02/07/23 1755

## 2023-02-07 NOTE — ED Triage Notes (Signed)
Pt here with reports of stepping on uneven cinderblock walkway when he twisted his R ankle and fell last night. Denies hitting head. Pt initially able to ambulate last night, but this morning increased pain and swelling to ankle. CNS intact.

## 2023-02-07 NOTE — Progress Notes (Signed)
Orthopedic Tech Progress Note Patient Details:  Brent Byrd. Sep 10, 1956 [id number]  Ortho Devices Type of Ortho Device: Crutches, Short leg splint Ortho Device/Splint Location: RLE Ortho Device/Splint Interventions: Ordered, Application, Adjustment   Post Interventions Patient Tolerated: Well Instructions Provided: Adjustment of device, Care of device, Poper ambulation with device  Brent Byrd L Exilda Wilhite 02/07/2023, 12:25 PM

## 2023-02-07 NOTE — Discharge Instructions (Signed)
You came to the emergency department with an ankle injury.  You did break 2 of the bones in your leg.  You need to make an appointment with orthopedics on Monday.  The number for the physician is on these papers.  I have sent a stronger pain medication to your pharmacy.  Please only take this for very severe pain.  It is addictive, so keep it out of the reach of others.  Do not drink alcohol or operate machinery on this.

## 2023-02-07 NOTE — ED Notes (Signed)
Patient Alert and oriented to baseline. Stable and ambulatory to baseline. Patient verbalized understanding of the discharge instructions.  Patient belongings were taken by the patient.   

## 2023-02-08 NOTE — Anesthesia Preprocedure Evaluation (Addendum)
Anesthesia Evaluation  Patient identified by MRN, date of birth, ID band Patient awake    Reviewed: Allergy & Precautions, NPO status , Patient's Chart, lab work & pertinent test results  History of Anesthesia Complications Negative for: history of anesthetic complications  Airway Mallampati: I      Comment: Previous grade I view with Miller 3, easy mask Dental no notable dental hx. (+) Dental Advisory Given   Pulmonary asthma , COPD,  COPD inhaler, Current Smoker and Patient abstained from smoking.   Pulmonary exam normal breath sounds clear to auscultation       Cardiovascular hypertension (amlodipine, HCTZ), Pt. on medications + Past MI (2007) and + DVT  Normal cardiovascular exam+ Valvular Problems/Murmurs MR  Rhythm:Regular Rate:Normal  Nuclear stress test:  high risk with concerns for multivessel CAD.  Patient was scheduled for left heart catheterization to evaluate for obstructive CAD.   Angiography films reviewed with the patient at today's visit.  Patient is noted to have ectatic vessels along the left coronary distribution and nonselective injection of the RCA noted small and nondominant vessel.  It was felt that the perfusion defects noted on MPI and likely due to endothelial dysfunction due to slow flow.  HLD  LHC 05/19/2022: Left Heart Catheterization 05/19/22:  LV: Not performed. LM: Large caliber vessel, bifurcates into very very large circumflex and LAD. CX: Marked generalized arteriomegaly noted, appeared to measure at least 8 to 9 mm in size in the proximal and mid segment with severe tortuosity.  Proximal circumflex has at most a 20 to 30% stenosis.  Slow flow is evident throughout the circumflex coronary artery.  It is a dominant vessel and gives origin to 2 large marginals and PDA branches. LAD: Large vessel.  Generalized arteriomegaly noted again.  Gives origin to large D2.  Small D1.  No significant  disease. RCA: Appears very small and nondominant.  Unable to cannulate the vessel in view of large body size of the patient and catheter is unable to reach and also using long catheters, still unable to engage the right coronary artery.  Eventually procedure abandoned due to severe spasm noted in the subclavian and innominate arteries with difficulty to talk to devices.  Recommendation: His nuclear stress is abnormal due to endothelial dysfunction.  There is clearly severe tortuosity of a very large and dominant circumflex coronary artery with slow flow.  Suspect endothelial dysfunction.  If high suspicion for CAD, could consider coronary CTA.  But the right coronary appears small.  Nonselective injection.  Smoking cessation, optimization of medical therapy for hypertension and hyperlipidemia and weight loss would be appropriate.  From surgical standpoint, he can be taken up for the upcoming surgery with low risk.  130 mL contrast utilized.  TTE 04/06/2022: Echocardiogram 04/06/2022:  Left ventricle cavity is normal in size. Mild concentric hypertrophy of  the left ventricle. Normal global wall motion. Normal LV systolic function  with EF 58%. Doppler evidence of grade I (impaired) diastolic dysfunction,  normal LAP.  The aortic root is dilated, maximal dimension 4.4 cm at sinotubular  junction.  Structurally normal trileaflet aortic valve.  Mild (Grade I) aortic  regurgitation.  Mild to moderate mitral regurgitation.  Normal right atrial pressure.      Neuro/Psych  Headaches PSYCHIATRIC DISORDERS (h/o alcohol abuse)  Depression    CVA (01/2012), No Residual Symptoms    GI/Hepatic ,GERD  Medicated,,(+)       alcohol use  Endo/Other  diabetes (Hgb A1c 6.2, glu 89), Well Controlled,  Type 2, Oral Hypoglycemic Agents    Renal/GU negative Renal ROS     Musculoskeletal  (+) Arthritis ,    Abdominal  (+) + obese  Peds  Hematology eliquis   Anesthesia Other Findings Last Eliquis:   Reproductive/Obstetrics                             Anesthesia Physical Anesthesia Plan  ASA: 3  Anesthesia Plan: General and Regional   Post-op Pain Management: Tylenol PO (pre-op)* and Regional block*   Induction: Intravenous  PONV Risk Score and Plan: 1 and Ondansetron, Dexamethasone and Treatment may vary due to age or medical condition  Airway Management Planned: LMA  Additional Equipment: None  Intra-op Plan:   Post-operative Plan: Extubation in OR  Informed Consent:      Dental advisory given  Plan Discussed with: CRNA and Anesthesiologist  Anesthesia Plan Comments: (Plan routine monitors, GA with popliteal and adductor canal blocks for post op analgesia  Discussed potential risks of nerve blocks including, but not limited to, infection, bleeding, nerve damage, seizures, pneumothorax, respiratory depression, and potential failure of the block. Alternatives to nerve blocks discussed. All questions answered.  Risks of general anesthesia discussed including, but not limited to, sore throat, hoarse voice, chipped/damaged teeth, injury to vocal cords, nausea and vomiting, allergic reactions, lung infection, heart attack, stroke, and death. All questions answered. )       Anesthesia Quick Evaluation

## 2023-02-08 NOTE — Progress Notes (Signed)
Pt scheduled for surgery on 02-15-2023 by Dr Odis Hollingshead @ [facility].  Chart reviewed by anesthesia, Dr Dot Lanes MDA due to extensive pulmonology/ cardiac medical history.  Dr Okey Dupre MDA stated pt will need to be done at main OR. Left voice mail message for Kenn File, [place] scheduler for Dr Odis Hollingshead, informed her per anesthesia to move pt to main OR.

## 2023-02-10 NOTE — Progress Notes (Signed)
Sent message, via epic in basket, requesting orders in epic from surgeon.  

## 2023-02-10 NOTE — Progress Notes (Addendum)
COVID Vaccine Completed: yes  Date of COVID positive in last 90 days: no  PCP - Fleet Contras, MD Cardiologist - Rozell Searing Custovic, DO LOV 12/02/22  Chest x-ray - n/a EKG - 12/02/22 Epic Stress Test - 04/13/22 Epic ECHO - 04/06/22 Epic Cardiac Cath - 05/19/22 Epic Pacemaker/ICD device last checked: n/a Spinal Cord Stimulator: n/a  Bowel Prep - no  Sleep Study - n/a CPAP -   Fasting Blood Sugar - 90-144 Checks Blood Sugar 1 times a day  Last dose of GLP1 agonist-  N/A GLP1 instructions:  N/A   Last dose of SGLT-2 inhibitors-  N/A SGLT-2 instructions: N/A   Blood Thinner Instructions:  Eliquis, hold 3 days Aspirin Instructions: ASA 81, no instructions per pr Last Dose: 02/11/23 1900  Activity level: Can perform activities of daily living without stopping and without symptoms of chest pain or shortness of breath.  Difficulty with ambulation due to fracture. Getting up with crutches to bathroom  Anesthesia review: HTN, COPD, DVT, pre DM, old MI  Patient denies shortness of breath, fever, cough and chest pain at PAT appointment  Patient verbalized understanding of instructions that were given to them at the PAT appointment. Patient was also instructed that they will need to review over the PAT instructions again at home before surgery.

## 2023-02-11 NOTE — H&P (Addendum)
ORTHOPAEDIC SURGERY H&P  Subjective:  The patient presents with right trimalleolar ankle fracture sustained on 02/06/23 when he fell down the stairs. He denies pain elsewhere. The patient was seen in the ER and splinted. He was discharged for outpatient follow up and scheduled for surgery once swelling permitted. His PMH is notable for EtOH abuse, COPD, smoker, DVT, depression, GERD, HTN, remote MI s/p cath, Pre-diabetes, Prior CVA. Denies numbness/tingling.    Past Medical History:  Diagnosis Date   Alcohol abuse    history of   Arthritis    Left knee, lower back   Chest pain    hx   COPD (chronic obstructive pulmonary disease)    "[place] said I don't have this" (10/26/2016)   Depression    no meds   DVT (deep venous thrombosis) 2008   Right leg- txed with coumadin    DVT (deep venous thrombosis) 2010; 2017   RLE; RLE   Gastroesophageal reflux disease 2014   Hypertension    MI, old ~ 2007   Pneumonia    Pre-diabetes    Stroke 01/2012   denies residual on 10/26/2016   Substance abuse    Alcohol dependence Hx   Tobacco abuse     Past Surgical History:  Procedure Laterality Date   GANGLION CYST EXCISION Left 2000   wrist   HAMMER TOE SURGERY Left 07/2012   2nd toe/08/05/2012   LEFT HEART CATH AND CORONARY ANGIOGRAPHY N/A 05/19/2022   Procedure: LEFT HEART CATH AND CORONARY ANGIOGRAPHY;  Surgeon: Yates Decamp, MD;  Location: [facility] INVASIVE CV LAB;  Service: Cardiovascular;  Laterality: N/A;   LUMBAR DISC SURGERY  06/2007   Herniated disc/notes 02/25/2011   TRANSURETHRAL RESECTION OF PROSTATE N/A 10/13/2022   Procedure: TRANSURETHRAL RESECTION OF THE PROSTATE (TURP);  Surgeon: Noel Christmas, MD;  Location: [facility] ORS;  Service: Urology;  Laterality: N/A;  90 MINS   WISDOM TOOTH EXTRACTION       (Not in an outpatient encounter)    Allergies  Allergen Reactions   Iodine Hives and Other (See Comments)   Omnipaque [Iohexol] Other (See Comments)    IV Dye-Sneezing     Social History   Socioeconomic History   Marital status: Divorced    Spouse name: Not on file   Number of children: 8   Years of education: Not on file   Highest education level: GED or equivalent  Occupational History   Occupation: Journalist, newspaper  Tobacco Use   Smoking status: Every Day    Packs/day: 0.25    Years: 45.00    Additional pack years: 0.00    Total pack years: 11.25    Types: Cigarettes   Smokeless tobacco: Never   Tobacco comments:    Pt reports a pack lasts a month  Vaping Use   Vaping Use: Never used  Substance and Sexual Activity   Alcohol use: Not Currently    Alcohol/week: 1.0 standard drink of alcohol    Types: 1 Cans of beer per week   Drug use: Not Currently    Types: "Crack" cocaine, Cocaine   Sexual activity: Yes  Other Topics Concern   Not on file  Social History Narrative   Lives with friend   Drink 1 cup of caffeine daily   Right Handed   Social Determinants of Health   Financial Resource Strain: Not on file  Food Insecurity: No Food Insecurity (10/13/2022)   Hunger Vital Sign    Worried About Running Out of Food  in the Last Year: Never true    Ran Out of Food in the Last Year: Never true  Transportation Needs: No Transportation Needs (10/13/2022)   PRAPARE - Administrator, Civil Service (Medical): No    Lack of Transportation (Non-Medical): No  Physical Activity: Not on file  Stress: Not on file  Social Connections: Not on file  Intimate Partner Violence: Not At Risk (10/13/2022)   Humiliation, Afraid, Rape, and Kick questionnaire    Fear of Current or Ex-Partner: No    Emotionally Abused: No    Physically Abused: No    Sexually Abused: No     History reviewed. No pertinent family history.   Review of Systems Pertinent items are noted in HPI.  Objective: Vital signs in last 24 hours:    02/07/2023   12:20 PM 02/07/2023    9:52 AM 12/02/2022   10:07 AM  Vitals with BMI  Height     Weight   285 lbs   BMI   34.71  Systolic 132 128 034  Diastolic 79 87 75  Pulse 83 78 71      EXAM: General: Well nourished, well developed. Awake, alert and oriented to time, place, person. Normal mood and affect. No apparent distress. Breathing room air.  Right Lower Extremity: Alignment - Neutral Deformity - None Skin intact, mild swelling Tenderness to palpation - globally around right ankle 5/5 TA, PT, GS, Per, EHL, FHL Sensation intact to light touch throughout Palpable DP and PT pulses Special testing: None  The contralateral foot/ankle was examined for comparison and noted to be neurovascularly intact with no localized deformity, swelling, or tenderness.  Imaging Review All images taken were independently reviewed by me.  Non weightbearing x-rays and CT taken previously demonstrate displaced comminuted trimalleolar right ankle fracture.  Assessment/Plan: The clinical and radiographic findings were reviewed and discussed at length with the patient.  The patient has right trimalleolar ankle fracture.  We spoke at length about the natural course of these findings. We discussed nonoperative and operative treatment options in detail.  The risks and benefits were presented and reviewed. The risks due to implant failure/irritation, infection, stiffness, nerve/vessel/tendon injury, wound healing issues, failure of this surgery, need for further surgery, thromboembolic events, amputation, death among others were discussed. The patient acknowledged the explanation and agreed to proceed with the plan.  Netta Cedars  Orthopaedic Surgery [facility]

## 2023-02-11 NOTE — Patient Instructions (Addendum)
SURGICAL WAITING ROOM VISITATION  Patients having surgery or a procedure may have no more than 2 support people in the waiting area - these visitors may rotate.    Children under the age of 76 must have an adult with them who is not the patient.  Due to an increase in RSV and influenza rates and associated hospitalizations, children ages 84 and under may not visit patients in [facility] hospitals.  If the patient needs to stay at the hospital during part of their recovery, the visitor guidelines for inpatient rooms apply. Pre-op nurse will coordinate an appropriate time for 1 support person to accompany patient in pre-op.  This support person may not rotate.    Please refer to the [facility] website for the visitor guidelines for Inpatients (after your surgery is over and you are in a regular room).    Your procedure is scheduled on: 02/15/23   Report to [facility] Main Entrance    Report to admitting at 8:45 AM   Call this number if you have problems the morning of surgery [phone]   Do not eat food or drink liquids :After Midnight.          If you have questions, please contact your surgeon's office.   FOLLOW BOWEL PREP AND ANY ADDITIONAL PRE OP INSTRUCTIONS YOU RECEIVED FROM YOUR SURGEON'S OFFICE!!!     Oral Hygiene is also important to reduce your risk of infection.                                    Remember - BRUSH YOUR TEETH THE MORNING OF SURGERY WITH YOUR REGULAR TOOTHPASTE  DENTURES WILL BE REMOVED PRIOR TO SURGERY PLEASE DO NOT APPLY "Poly grip" OR ADHESIVES!!!   Do NOT smoke after Midnight   Take these medicines the morning of surgery with A SIP OF WATER: Inhalers, Amlodipine, Zyrtec, Flonase, Omeprazole, Oxycodone   These are anesthesia recommendations for holding your anticoagulants.  Please contact your prescribing physician to confirm IF it is safe to hold your anticoagulants for this length of time.   Eliquis Apixaban   72 hours   Xarelto  Rivaroxaban   72 hours  Plavix Clopidogrel   120 hours  Pletal Cilostazol   120 hours    DO NOT TAKE ANY ORAL DIABETIC MEDICATIONS DAY OF YOUR SURGERY  How to Manage Your Diabetes Before and After Surgery  Why is it important to control my blood sugar before and after surgery? Improving blood sugar levels before and after surgery helps healing and can limit problems. A way of improving blood sugar control is eating a healthy diet by:  Eating less sugar and carbohydrates  Increasing activity/exercise  Talking with your doctor about reaching your blood sugar goals High blood sugars (greater than 180 mg/dL) can raise your risk of infections and slow your recovery, so you will need to focus on controlling your diabetes during the weeks before surgery. Make sure that the doctor who takes care of your diabetes knows about your planned surgery including the date and location.  How do I manage my blood sugar before surgery? Check your blood sugar at least 4 times a day, starting 2 days before surgery, to make sure that the level is not too high or low. Check your blood sugar the morning of your surgery when you wake up and every 2 hours until you get to the Short Stay  unit. If your blood sugar is less than 70 mg/dL, you will need to treat for low blood sugar: Do not take insulin. Treat a low blood sugar (less than 70 mg/dL) with  cup of clear juice (cranberry or apple), 4 glucose tablets, OR glucose gel. Recheck blood sugar in 15 minutes after treatment (to make sure it is greater than 70 mg/dL). If your blood sugar is not greater than 70 mg/dL on recheck, call [phone] for further instructions. Report your blood sugar to the short stay nurse when you get to Short Stay.  If you are admitted to the hospital after surgery: Your blood sugar will be checked by the staff and you will probably be given insulin after surgery (instead of oral diabetes medicines) to make sure you have good blood  sugar levels. The goal for blood sugar control after surgery is 80-180 mg/dL.   WHAT DO I DO ABOUT MY DIABETES MEDICATION?  Do not take oral diabetes medicines (pills) the morning of surgery.  THE DAY BEFORE SURGERY, take Januvia and Metformin as prescribed.       THE MORNING OF SURGERY, do not take Metformin or Januvia.  Reviewed and Endorsed by [facility] Patient Education Committee, August 2015  Bring CPAP mask and tubing day of surgery.                              You may not have any metal on your body including jewelry, and body piercing             Do not wear lotions, powders, cologne, or deodorant  Do not shave  48 hours prior to surgery.               Men may shave face and neck.   Do not bring valuables to the hospital. [facility] IS NOT             RESPONSIBLE   FOR VALUABLES.   Contacts, glasses, dentures or bridgework may not be worn into surgery.  DO NOT BRING YOUR HOME MEDICATIONS TO THE HOSPITAL. PHARMACY WILL DISPENSE MEDICATIONS LISTED ON YOUR MEDICATION LIST TO YOU DURING YOUR ADMISSION IN THE HOSPITAL!    Patients discharged on the day of surgery will not be allowed to drive home.  Someone NEEDS to stay with you for the first 24 hours after anesthesia.   Special Instructions: Bring a copy of your healthcare power of attorney and living will documents the day of surgery if you haven't scanned them before.              Please read over the following fact sheets you were given: IF YOU HAVE QUESTIONS ABOUT YOUR PRE-OP INSTRUCTIONS PLEASE CALL [phone] Contras    If you received a COVID test during your pre-op visit  it is requested that you wear a mask when out in public, stay away from anyone that may not be feeling well and notify your surgeon if you develop symptoms. If you test positive for Covid or have been in contact with anyone that has tested positive in the last 10 days please notify you surgeon.    [facility] - Preparing for Surgery Before  surgery, you can play an important role.  Because skin is not sterile, your skin needs to be as free of germs as possible.  You can reduce the number of germs on your skin by washing with CHG (chlorahexidine gluconate) soap before  surgery.  CHG is an antiseptic cleaner which kills germs and bonds with the skin to continue killing germs even after washing. Please DO NOT use if you have an allergy to CHG or antibacterial soaps.  If your skin becomes reddened/irritated stop using the CHG and inform your nurse when you arrive at Short Stay. Do not shave (including legs and underarms) for at least 48 hours prior to the first CHG shower.  You may shave your face/neck.  Please follow these instructions carefully:  1.  Shower with CHG Soap the night before surgery and the  morning of surgery.  2.  If you choose to wash your hair, wash your hair first as usual with your normal  shampoo.  3.  After you shampoo, rinse your hair and body thoroughly to remove the shampoo.                             4.  Use CHG as you would any other liquid soap.  You can apply chg directly to the skin and wash.  Gently with a scrungie or clean washcloth.  5.  Apply the CHG Soap to your body ONLY FROM THE NECK DOWN.   Do   not use on face/ open                           Wound or open sores. Avoid contact with eyes, ears mouth and   genitals (private parts).                       Wash face,  Genitals (private parts) with your normal soap.             6.  Wash thoroughly, paying special attention to the area where your    surgery  will be performed.  7.  Thoroughly rinse your body with warm water from the neck down.  8.  DO NOT shower/wash with your normal soap after using and rinsing off the CHG Soap.                9.  Pat yourself dry with a clean towel.            10.  Wear clean pajamas.            11.  Place clean sheets on your bed the night of your first shower and do not  sleep with pets. Day of Surgery : Do not apply  any lotions/deodorants the morning of surgery.  Please wear clean clothes to the hospital/surgery center.  FAILURE TO FOLLOW THESE INSTRUCTIONS MAY RESULT IN THE CANCELLATION OF YOUR SURGERY  PATIENT SIGNATURE_________________________________  NURSE SIGNATURE__________________________________  ________________________________________________________________________  Rogelia Mire  An incentive spirometer is a tool that can help keep your lungs clear and active. This tool measures how well you are filling your lungs with each breath. Taking long deep breaths may help reverse or decrease the chance of developing breathing (pulmonary) problems (especially infection) following: A long period of time when you are unable to move or be active. BEFORE THE PROCEDURE  If the spirometer includes an indicator to show your best effort, your nurse or respiratory therapist will set it to a desired goal. If possible, sit up straight or lean slightly forward. Try not to slouch. Hold the incentive spirometer in an upright position. INSTRUCTIONS FOR USE  Sit on the edge of  your bed if possible, or sit up as far as you can in bed or on a chair. Hold the incentive spirometer in an upright position. Breathe out normally. Place the mouthpiece in your mouth and seal your lips tightly around it. Breathe in slowly and as deeply as possible, raising the piston or the ball toward the top of the column. Hold your breath for 3-5 seconds or for as long as possible. Allow the piston or ball to fall to the bottom of the column. Remove the mouthpiece from your mouth and breathe out normally. Rest for a few seconds and repeat Steps 1 through 7 at least 10 times every 1-2 hours when you are awake. Take your time and take a few normal breaths between deep breaths. The spirometer may include an indicator to show your best effort. Use the indicator as a goal to work toward during each repetition. After each set of 10  deep breaths, practice coughing to be sure your lungs are clear. If you have an incision (the cut made at the time of surgery), support your incision when coughing by placing a pillow or rolled up towels firmly against it. Once you are able to get out of bed, walk around indoors and cough well. You may stop using the incentive spirometer when instructed by your caregiver.  RISKS AND COMPLICATIONS Take your time so you do not get dizzy or light-headed. If you are in pain, you may need to take or ask for pain medication before doing incentive spirometry. It is harder to take a deep breath if you are having pain. AFTER USE Rest and breathe slowly and easily. It can be helpful to keep track of a log of your progress. Your caregiver can provide you with a simple table to help with this. If you are using the spirometer at home, follow these instructions: SEEK MEDICAL CARE IF:  You are having difficultly using the spirometer. You have trouble using the spirometer as often as instructed. Your pain medication is not giving enough relief while using the spirometer. You develop fever of 100.5 F (38.1 C) or higher. SEEK IMMEDIATE MEDICAL CARE IF:  You cough up bloody sputum that had not been present before. You develop fever of 102 F (38.9 C) or greater. You develop worsening pain at or near the incision site. MAKE SURE YOU:  Understand these instructions. Will watch your condition. Will get help right away if you are not doing well or get worse. Document Released: 02/22/2007 Document Revised: 01/04/2012 Document Reviewed: 04/25/2007 [facility] Patient Information 2014 [place], [place].   ________________________________________________________________________

## 2023-02-11 NOTE — Discharge Instructions (Addendum)
Netta Cedars, MD [facility]  Please read the following information regarding your care after surgery.  Medications  You only need a prescription for the narcotic pain medicine (ex. oxycodone, Percocet, Norco).  All of the other medicines listed below are available over the counter. ? Aleve 2 pills twice a day for the first 3 days after surgery. ? acetominophen (Tylenol) 650 mg every 4-6 hours as you need for minor to moderate pain ? oxycodone as prescribed for severe pain  ? To help prevent blood clots, starting 24 hours after surgery, restart your Eliquis 48 hours after surgery or as directed by your cardiologist. You should also get up every hour while you are awake to move around.  Weight Bearing ? Do NOT bear any weight on the operated leg or foot. This means do NOT touch your surgical leg to the ground!  Cast / Splint / Dressing ? If you have a splint, do NOT remove this. Keep your splint, cast or dressing clean and dry.  Don't put anything (coat hanger, pencil, etc) down inside of it.  If it gets wet, call the office immediately to schedule an appointment for a cast change.  Swelling IMPORTANT: It is normal for you to have swelling where you had surgery. To reduce swelling and pain, keep at least 3 pillows under your leg so that your toes are above your nose and your heel is above the level of your hip.  It may be necessary to keep your foot or leg elevated for several weeks.  This is critical to helping your incisions heal and your pain to feel better.  Follow Up Call my office at [phone] when you are discharged from the hospital or surgery center to schedule an appointment to be seen 7-10 days after surgery.  Call my office at [phone] if you develop a fever >101.5 F, nausea, vomiting, bleeding from the surgical site or severe pain.

## 2023-02-12 ENCOUNTER — Encounter (HOSPITAL_COMMUNITY): Payer: Self-pay

## 2023-02-12 ENCOUNTER — Encounter (HOSPITAL_COMMUNITY)
Admission: RE | Admit: 2023-02-12 | Discharge: 2023-02-12 | Disposition: A | Discharge status: Home / Self Care | Payer: Medicaid Other | Source: Ambulatory Visit | Attending: Orthopaedic Surgery | Admitting: Orthopaedic Surgery

## 2023-02-12 ENCOUNTER — Other Ambulatory Visit: Payer: Self-pay

## 2023-02-12 VITALS — BP 118/90 | HR 83 | Temp 98.3°F | Resp 16 | Ht 72.0 in | Wt 280.0 lb

## 2023-02-12 DIAGNOSIS — I2510 Atherosclerotic heart disease of native coronary artery without angina pectoris: Secondary | ICD-10-CM | POA: Diagnosis not present

## 2023-02-12 DIAGNOSIS — X58XXXA Exposure to other specified factors, initial encounter: Secondary | ICD-10-CM | POA: Insufficient documentation

## 2023-02-12 DIAGNOSIS — S82851A Displaced trimalleolar fracture of right lower leg, initial encounter for closed fracture: Secondary | ICD-10-CM | POA: Insufficient documentation

## 2023-02-12 DIAGNOSIS — J449 Chronic obstructive pulmonary disease, unspecified: Secondary | ICD-10-CM | POA: Diagnosis not present

## 2023-02-12 DIAGNOSIS — Z01812 Encounter for preprocedural laboratory examination: Secondary | ICD-10-CM | POA: Insufficient documentation

## 2023-02-12 DIAGNOSIS — Z01818 Encounter for other preprocedural examination: Secondary | ICD-10-CM

## 2023-02-12 DIAGNOSIS — I10 Essential (primary) hypertension: Secondary | ICD-10-CM

## 2023-02-12 DIAGNOSIS — F17200 Nicotine dependence, unspecified, uncomplicated: Secondary | ICD-10-CM | POA: Diagnosis not present

## 2023-02-12 DIAGNOSIS — Z86718 Personal history of other venous thrombosis and embolism: Secondary | ICD-10-CM | POA: Insufficient documentation

## 2023-02-12 DIAGNOSIS — Z8673 Personal history of transient ischemic attack (TIA), and cerebral infarction without residual deficits: Secondary | ICD-10-CM | POA: Insufficient documentation

## 2023-02-12 DIAGNOSIS — E119 Type 2 diabetes mellitus without complications: Secondary | ICD-10-CM | POA: Diagnosis not present

## 2023-02-12 DIAGNOSIS — I252 Old myocardial infarction: Secondary | ICD-10-CM | POA: Insufficient documentation

## 2023-02-12 HISTORY — DX: Type 2 diabetes mellitus without complications: E11.9

## 2023-02-12 LAB — SURGICAL PCR SCREEN
MRSA, PCR: NEGATIVE
Staphylococcus aureus: NEGATIVE

## 2023-02-12 LAB — CBC
HCT: 39.7 % (ref 39.0–52.0)
Hemoglobin: 12.8 g/dL — ABNORMAL LOW (ref 13.0–17.0)
MCH: 27.8 pg (ref 26.0–34.0)
MCHC: 32.2 g/dL (ref 30.0–36.0)
MCV: 86.3 fL (ref 80.0–100.0)
Platelets: 207 10*3/uL (ref 150–400)
RBC: 4.6 MIL/uL (ref 4.22–5.81)
RDW: 18.5 % — ABNORMAL HIGH (ref 11.5–15.5)
WBC: 5.4 10*3/uL (ref 4.0–10.5)
nRBC: 0 % (ref 0.0–0.2)

## 2023-02-12 LAB — BASIC METABOLIC PANEL
Anion gap: 10 (ref 5–15)
BUN: 12 mg/dL (ref 8–23)
CO2: 22 mmol/L (ref 22–32)
Calcium: 9 mg/dL (ref 8.9–10.3)
Chloride: 99 mmol/L (ref 98–111)
Creatinine, Ser: 0.97 mg/dL (ref 0.61–1.24)
GFR, Estimated: >60 mL/min (ref >60)
Glucose, Bld: 104 mg/dL — ABNORMAL HIGH (ref 70–99)
Potassium: 4 mmol/L (ref 3.5–5.1)
Sodium: 131 mmol/L — ABNORMAL LOW (ref 135–145)

## 2023-02-12 LAB — HEMOGLOBIN A1C
Hgb A1c MFr Bld: 6.2 % — ABNORMAL HIGH (ref 4.8–5.6)
Mean Plasma Glucose: 131.24 mg/dL

## 2023-02-12 LAB — GLUCOSE, CAPILLARY: Glucose-Capillary: 117 mg/dL — ABNORMAL HIGH (ref 70–99)

## 2023-02-12 NOTE — Progress Notes (Signed)
Anesthesia Chart Review   Case: [id number] Date/Time: 02/15/23 1045   Procedure: Right ankle trimalleolar open reduction internal fixation, possible syndesmosis and/or deltoid fixation, possible allograft, possible external fixation (Right: Ankle)   Anesthesia type: Choice   Pre-op diagnosis: Closed trimalleolar fracture of right ankle   Location: [facility] ROOM 06 / [facility]   Surgeons: Netta Cedars, MD       DISCUSSION:67 y.o. smoker with h/o stroke with left food drop, COPD, DM II, CAD (MI 2007), Mild to moderate mitral regurgitation, DVT, substance abuse, closed trimalleolar fracture right ankle scheduled for above procedure 02/15/23 with Dr. Netta Cedars.   Pt last seen by cardiology 12/02/2022. Stable at this visit with no changes to treatment, asymptomatic.  VS: BP (!) 118/90   Pulse 83   Temp 36.8 C (Oral)   Resp 16   Ht 6' (1.829 m)   Wt 127 kg   SpO2 95%   BMI 37.97 kg/m   PROVIDERS: Fleet Contras, MD is PCP   Clotilde Dieter, MD is Cardiologist  LABS: Labs reviewed: Acceptable for surgery. (all labs ordered are listed, but only abnormal results are displayed)  Labs Reviewed  CBC - Abnormal; Notable for the following components:      Result Value   Hemoglobin 12.8 (*)    RDW 18.5 (*)    All other components within normal limits  GLUCOSE, CAPILLARY - Abnormal; Notable for the following components:   Glucose-Capillary 117 (*)    All other components within normal limits  SURGICAL PCR SCREEN  HEMOGLOBIN A1C  BASIC METABOLIC PANEL     IMAGES:   EKG:   CV: Echocardiogram 04/06/2022:  Left ventricle cavity is normal in size. Mild concentric hypertrophy of  the left ventricle. Normal global wall motion. Normal LV systolic function  with EF 58%. Doppler evidence of grade I (impaired) diastolic dysfunction,  normal LAP.  The aortic root is dilated, maximal dimension 4.4 cm at sinotubular  junction.  Structurally normal trileaflet aortic valve.  Mild  (Grade I) aortic  regurgitation.  Mild to moderate mitral regurgitation.  Normal right atrial pressure.   Left Heart Catheterization 05/19/22:  LV: Not performed. LM: Large caliber vessel, bifurcates into very very large circumflex and LAD. CX: Marked generalized arteriomegaly noted, appeared to measure at least 8 to 9 mm in size in the proximal and mid segment with severe tortuosity.  Proximal circumflex has at most a 20 to 30% stenosis.  Slow flow is evident throughout the circumflex coronary artery.  It is a dominant vessel and gives origin to 2 large marginals and PDA branches. LAD: Large vessel.  Generalized arteriomegaly noted again.  Gives origin to large D2.  Small D1.  No significant disease. RCA: Appears very small and nondominant.  Unable to cannulate the vessel in view of large body size of the patient and catheter is unable to reach and also using long catheters, still unable to engage the right coronary artery.  Eventually procedure abandoned due to severe spasm noted in the subclavian and innominate arteries with difficulty to talk to devices.  Recommendation: His nuclear stress is abnormal due to endothelial dysfunction.  There is clearly severe tortuosity of a very large and dominant circumflex coronary artery with slow flow.  Suspect endothelial dysfunction.  If high suspicion for CAD, could consider coronary CTA.  But the right coronary appears small.  Nonselective injection.  Echo 10/26/2016 - Left ventricle: The cavity size was normal. Wall thickness was    increased in a  pattern of moderate to severe LVH. Systolic    function was normal. The estimated ejection fraction was in the    range of 55% to 60%. Wall motion was normal; there were no    regional wall motion abnormalities. Doppler parameters are    consistent with abnormal left ventricular relaxation (grade 1    diastolic dysfunction).  - Aortic valve: There was mild regurgitation.  - Aortic root: The aortic root was  moderately dilated.  - Left atrium: The atrium was moderately to severely dilated.  Past Medical History:  Diagnosis Date   Alcohol abuse    history of   Arthritis    Left knee, lower back   Chest pain    hx   COPD (chronic obstructive pulmonary disease)    "[place] said I don't have this" (10/26/2016)   Depression    no meds   Diabetes mellitus without complication    DVT (deep venous thrombosis) 2008   Right leg- txed with coumadin    DVT (deep venous thrombosis) 2010; 2017   RLE; RLE   Gastroesophageal reflux disease 2014   Hypertension    MI, old ~ 2007   Pneumonia    Pre-diabetes    Stroke 01/2012   denies residual on 10/26/2016   Substance abuse    Alcohol dependence Hx   Tobacco abuse     Past Surgical History:  Procedure Laterality Date   GANGLION CYST EXCISION Left 2000   wrist   HAMMER TOE SURGERY Left 07/2012   2nd toe/08/05/2012   LEFT HEART CATH AND CORONARY ANGIOGRAPHY N/A 05/19/2022   Procedure: LEFT HEART CATH AND CORONARY ANGIOGRAPHY;  Surgeon: Yates Decamp, MD;  Location: [facility];  Service: Cardiovascular;  Laterality: N/A;   LUMBAR DISC SURGERY  06/2007   Herniated disc/notes 02/25/2011   TRANSURETHRAL RESECTION OF PROSTATE N/A 10/13/2022   Procedure: TRANSURETHRAL RESECTION OF THE PROSTATE (TURP);  Surgeon: Noel Christmas, MD;  Location: [facility];  Service: Urology;  Laterality: N/A;  90 MINS   WISDOM TOOTH EXTRACTION      MEDICATIONS:  acetaminophen (TYLENOL) 500 MG tablet   albuterol (PROVENTIL) (2.5 MG/3ML) 0.083% nebulizer solution   albuterol (VENTOLIN HFA) 108 (90 Base) MCG/ACT inhaler   amLODipine (NORVASC) 5 MG tablet   aspirin EC 81 MG tablet   Budeson-Glycopyrrol-Formoterol (BREZTRI AEROSPHERE) 160-9-4.8 MCG/ACT AERO   cetirizine (ZYRTEC) 10 MG tablet   diclofenac Sodium (VOLTAREN ARTHRITIS PAIN) 1 % GEL   ELIQUIS 5 MG TABS tablet   fluticasone (FLONASE) 50 MCG/ACT nasal spray   hydrochlorothiazide (MICROZIDE) 12.5 MG  capsule   HYDROcodone-acetaminophen (NORCO/VICODIN) 5-325 MG tablet   JARDIANCE 10 MG TABS tablet   metFORMIN (GLUCOPHAGE) 500 MG tablet   nitroGLYCERIN (NITROSTAT) 0.4 MG SL tablet   omeprazole (PRILOSEC) 20 MG capsule   rosuvastatin (CRESTOR) 20 MG tablet   Vitamin D, Ergocalciferol, (DRISDOL) 1.25 MG (50000 UNIT) CAPS capsule   No current facility-administered medications for this encounter.     Jodell Cipro Ward, PA-C [facility] [phone]

## 2023-02-14 ENCOUNTER — Encounter: Payer: Self-pay | Admitting: Cardiology

## 2023-02-15 ENCOUNTER — Other Ambulatory Visit: Payer: Self-pay

## 2023-02-15 ENCOUNTER — Ambulatory Visit (HOSPITAL_COMMUNITY): Payer: Medicaid Other

## 2023-02-15 ENCOUNTER — Ambulatory Visit (HOSPITAL_COMMUNITY): Payer: Medicaid Other | Admitting: Anesthesiology

## 2023-02-15 ENCOUNTER — Encounter (HOSPITAL_COMMUNITY): Payer: Self-pay | Admitting: Orthopaedic Surgery

## 2023-02-15 ENCOUNTER — Encounter (HOSPITAL_COMMUNITY)
Admission: RE | Disposition: A | Discharge status: Home / Self Care | Payer: Self-pay | Source: Ambulatory Visit | Attending: Orthopaedic Surgery

## 2023-02-15 ENCOUNTER — Observation Stay (HOSPITAL_COMMUNITY)
Admission: RE | Admit: 2023-02-15 | Discharge: 2023-02-17 | Disposition: A | Discharge status: Home / Self Care | Payer: Medicaid Other | Source: Ambulatory Visit | Attending: Orthopaedic Surgery | Admitting: Orthopaedic Surgery

## 2023-02-15 DIAGNOSIS — Z8673 Personal history of transient ischemic attack (TIA), and cerebral infarction without residual deficits: Secondary | ICD-10-CM | POA: Insufficient documentation

## 2023-02-15 DIAGNOSIS — Z01818 Encounter for other preprocedural examination: Secondary | ICD-10-CM

## 2023-02-15 DIAGNOSIS — J449 Chronic obstructive pulmonary disease, unspecified: Secondary | ICD-10-CM | POA: Insufficient documentation

## 2023-02-15 DIAGNOSIS — W109XXA Fall (on) (from) unspecified stairs and steps, initial encounter: Secondary | ICD-10-CM | POA: Diagnosis not present

## 2023-02-15 DIAGNOSIS — F17200 Nicotine dependence, unspecified, uncomplicated: Secondary | ICD-10-CM

## 2023-02-15 DIAGNOSIS — S82851A Displaced trimalleolar fracture of right lower leg, initial encounter for closed fracture: Secondary | ICD-10-CM | POA: Diagnosis present

## 2023-02-15 DIAGNOSIS — Z7984 Long term (current) use of oral hypoglycemic drugs: Secondary | ICD-10-CM | POA: Diagnosis not present

## 2023-02-15 DIAGNOSIS — Z86718 Personal history of other venous thrombosis and embolism: Secondary | ICD-10-CM | POA: Insufficient documentation

## 2023-02-15 DIAGNOSIS — E119 Type 2 diabetes mellitus without complications: Secondary | ICD-10-CM

## 2023-02-15 DIAGNOSIS — Z7901 Long term (current) use of anticoagulants: Secondary | ICD-10-CM | POA: Diagnosis not present

## 2023-02-15 DIAGNOSIS — I10 Essential (primary) hypertension: Secondary | ICD-10-CM

## 2023-02-15 DIAGNOSIS — Z9889 Other specified postprocedural states: Secondary | ICD-10-CM

## 2023-02-15 DIAGNOSIS — I252 Old myocardial infarction: Secondary | ICD-10-CM | POA: Diagnosis not present

## 2023-02-15 DIAGNOSIS — F17210 Nicotine dependence, cigarettes, uncomplicated: Secondary | ICD-10-CM | POA: Diagnosis not present

## 2023-02-15 HISTORY — PX: ORIF ANKLE FRACTURE: SHX5408

## 2023-02-15 LAB — GLUCOSE, CAPILLARY
Glucose-Capillary: 89 mg/dL (ref 70–99)
Glucose-Capillary: 97 mg/dL (ref 70–99)
Glucose-Capillary: 69 mg/dL — ABNORMAL LOW (ref 70–99)
Glucose-Capillary: 86 mg/dL (ref 70–99)
Glucose-Capillary: 95 mg/dL (ref 70–99)

## 2023-02-15 SURGERY — OPEN REDUCTION INTERNAL FIXATION (ORIF) ANKLE FRACTURE
Anesthesia: Regional | Site: Ankle | Laterality: Right

## 2023-02-15 MED ORDER — BUPIVACAINE HCL (PF) 0.5 % IJ SOLN
INTRAMUSCULAR | Status: DC | PRN
  Administered 2023-02-15 (×2): 20 mL via PERINEURAL

## 2023-02-15 MED ORDER — MIDAZOLAM HCL 2 MG/2ML IJ SOLN
INTRAMUSCULAR | Status: AC
  Filled 2023-02-15: qty 2

## 2023-02-15 MED ORDER — POLYETHYLENE GLYCOL 3350 17 G PO PACK: 17.0000 g | PACK | Freq: Every day | ORAL | Status: DC | PRN

## 2023-02-15 MED ORDER — BUPIVACAINE LIPOSOME 1.3 % IJ SUSP
INTRAMUSCULAR | Status: DC | PRN
  Administered 2023-02-15: 10 mL via PERINEURAL

## 2023-02-15 MED ORDER — ACETAMINOPHEN 325 MG PO TABS: 325.0000 mg | ORAL_TABLET | Freq: Four times a day (QID) | ORAL | Status: DC | PRN

## 2023-02-15 MED ORDER — CEFAZOLIN SODIUM-DEXTROSE 2-4 GM/100ML-% IV SOLN
2.0000 g | Freq: Three times a day (TID) | INTRAVENOUS | Status: AC
  Administered 2023-02-15 – 2023-02-16 (×2): 2 g via INTRAVENOUS
  Filled 2023-02-15 (×2): qty 100

## 2023-02-15 MED ORDER — HYDROCHLOROTHIAZIDE 12.5 MG PO CAPS: 12.5000 mg | ORAL_CAPSULE | Freq: Every day | ORAL | Status: DC

## 2023-02-15 MED ORDER — PROPOFOL 10 MG/ML IV BOLUS
INTRAVENOUS | Status: AC
  Filled 2023-02-15: qty 20

## 2023-02-15 MED ORDER — LACTATED RINGERS IV SOLN: INTRAVENOUS | Status: DC

## 2023-02-15 MED ORDER — FENTANYL CITRATE (PF) 100 MCG/2ML IJ SOLN
INTRAMUSCULAR | Status: DC | PRN
  Administered 2023-02-15 (×2): 25 ug via INTRAVENOUS
  Administered 2023-02-15: 50 ug via INTRAVENOUS
  Administered 2023-02-15 (×3): 25 ug via INTRAVENOUS
  Administered 2023-02-15: 50 ug via INTRAVENOUS
  Administered 2023-02-15: 25 ug via INTRAVENOUS

## 2023-02-15 MED ORDER — FENTANYL CITRATE (PF) 100 MCG/2ML IJ SOLN
INTRAMUSCULAR | Status: AC
  Filled 2023-02-15: qty 2

## 2023-02-15 MED ORDER — CHLORHEXIDINE GLUCONATE 0.12 % MT SOLN
15.0000 mL | Freq: Once | OROMUCOSAL | Status: AC
  Administered 2023-02-15: 15 mL via OROMUCOSAL

## 2023-02-15 MED ORDER — ONDANSETRON HCL 4 MG/2ML IJ SOLN
INTRAMUSCULAR | Status: AC
  Filled 2023-02-15: qty 2

## 2023-02-15 MED ORDER — VANCOMYCIN HCL 1000 MG IV SOLR
INTRAVENOUS | Status: DC | PRN
  Administered 2023-02-15: 1000 mg via TOPICAL

## 2023-02-15 MED ORDER — HYDROCODONE-ACETAMINOPHEN 5-325 MG PO TABS
1.0000 | ORAL_TABLET | ORAL | Status: DC | PRN
  Administered 2023-02-16 (×3): 2 via ORAL
  Filled 2023-02-15 (×3): qty 2

## 2023-02-15 MED ORDER — PHENYLEPHRINE 80 MCG/ML (10ML) SYRINGE FOR IV PUSH (FOR BLOOD PRESSURE SUPPORT)
PREFILLED_SYRINGE | INTRAVENOUS | Status: DC | PRN
  Administered 2023-02-15: 80 ug via INTRAVENOUS

## 2023-02-15 MED ORDER — FENTANYL CITRATE PF 50 MCG/ML IJ SOSY: 25.0000 ug | PREFILLED_SYRINGE | INTRAMUSCULAR | Status: DC | PRN

## 2023-02-15 MED ORDER — 0.9 % SODIUM CHLORIDE (POUR BTL) OPTIME
TOPICAL | Status: DC | PRN
  Administered 2023-02-15: 1000 mL

## 2023-02-15 MED ORDER — MIDAZOLAM HCL 2 MG/2ML IJ SOLN
1.0000 mg | INTRAMUSCULAR | Status: DC
  Administered 2023-02-15 (×2): 1 mg via INTRAVENOUS
  Filled 2023-02-15: qty 2

## 2023-02-15 MED ORDER — ACETAMINOPHEN 500 MG PO TABS
500.0000 mg | ORAL_TABLET | Freq: Four times a day (QID) | ORAL | Status: AC
  Administered 2023-02-15 – 2023-02-16 (×3): 500 mg via ORAL
  Filled 2023-02-15 (×3): qty 1

## 2023-02-15 MED ORDER — FENTANYL CITRATE PF 50 MCG/ML IJ SOSY
50.0000 ug | PREFILLED_SYRINGE | INTRAMUSCULAR | Status: DC
  Administered 2023-02-15 (×2): 50 ug via INTRAVENOUS
  Filled 2023-02-15: qty 2

## 2023-02-15 MED ORDER — OXYCODONE HCL 5 MG/5ML PO SOLN: 5.0000 mg | Freq: Once | ORAL | Status: DC | PRN

## 2023-02-15 MED ORDER — AMISULPRIDE (ANTIEMETIC) 5 MG/2ML IV SOLN: 10.0000 mg | Freq: Once | INTRAVENOUS | Status: DC | PRN

## 2023-02-15 MED ORDER — ACETAMINOPHEN 500 MG PO TABS
1000.0000 mg | ORAL_TABLET | Freq: Once | ORAL | Status: AC
  Administered 2023-02-15: 1000 mg via ORAL
  Filled 2023-02-15: qty 2

## 2023-02-15 MED ORDER — METOCLOPRAMIDE HCL 5 MG/ML IJ SOLN: 5.0000 mg | Freq: Three times a day (TID) | INTRAMUSCULAR | Status: DC | PRN

## 2023-02-15 MED ORDER — HYDROCODONE-ACETAMINOPHEN 7.5-325 MG PO TABS
1.0000 | ORAL_TABLET | ORAL | Status: DC | PRN
  Administered 2023-02-16 – 2023-02-17 (×4): 2 via ORAL
  Filled 2023-02-15 (×4): qty 2

## 2023-02-15 MED ORDER — ROSUVASTATIN CALCIUM 20 MG PO TABS
20.0000 mg | ORAL_TABLET | Freq: Every day | ORAL | Status: DC
  Administered 2023-02-16 – 2023-02-17 (×2): 20 mg via ORAL
  Filled 2023-02-15 (×2): qty 1

## 2023-02-15 MED ORDER — MORPHINE SULFATE (PF) 2 MG/ML IV SOLN
0.5000 mg | INTRAVENOUS | Status: DC | PRN
  Administered 2023-02-16: 1 mg via INTRAVENOUS
  Filled 2023-02-15: qty 1

## 2023-02-15 MED ORDER — ORAL CARE MOUTH RINSE: 15.0000 mL | Freq: Once | OROMUCOSAL | Status: AC

## 2023-02-15 MED ORDER — HYDROCHLOROTHIAZIDE 12.5 MG PO TABS
12.5000 mg | ORAL_TABLET | Freq: Every day | ORAL | Status: DC
  Administered 2023-02-16 – 2023-02-17 (×2): 12.5 mg via ORAL
  Filled 2023-02-15 (×2): qty 1

## 2023-02-15 MED ORDER — AMLODIPINE BESYLATE 5 MG PO TABS
5.0000 mg | ORAL_TABLET | Freq: Every day | ORAL | Status: DC
  Administered 2023-02-16 – 2023-02-17 (×2): 5 mg via ORAL
  Filled 2023-02-15 (×2): qty 1

## 2023-02-15 MED ORDER — ONDANSETRON HCL 4 MG PO TABS: 4.0000 mg | ORAL_TABLET | Freq: Four times a day (QID) | ORAL | Status: DC | PRN

## 2023-02-15 MED ORDER — ONDANSETRON HCL 4 MG/2ML IJ SOLN: 4.0000 mg | Freq: Four times a day (QID) | INTRAMUSCULAR | Status: DC | PRN

## 2023-02-15 MED ORDER — VANCOMYCIN HCL 1000 MG IV SOLR
INTRAVENOUS | Status: AC
  Filled 2023-02-15: qty 20

## 2023-02-15 MED ORDER — BUPIVACAINE HCL 0.25 % IJ SOLN
INTRAMUSCULAR | Status: AC
  Filled 2023-02-15: qty 1

## 2023-02-15 MED ORDER — ONDANSETRON HCL 4 MG/2ML IJ SOLN
INTRAMUSCULAR | Status: DC | PRN
  Administered 2023-02-15: 4 mg via INTRAVENOUS

## 2023-02-15 MED ORDER — CEFAZOLIN IN SODIUM CHLORIDE 3-0.9 GM/100ML-% IV SOLN
3.0000 g | INTRAVENOUS | Status: AC
  Administered 2023-02-15: 3 g via INTRAVENOUS
  Filled 2023-02-15: qty 100

## 2023-02-15 MED ORDER — INSULIN ASPART 100 UNIT/ML IJ SOLN: 0.0000 [IU] | Freq: Every day | INTRAMUSCULAR | Status: DC

## 2023-02-15 MED ORDER — METOCLOPRAMIDE HCL 5 MG PO TABS: 5.0000 mg | ORAL_TABLET | Freq: Three times a day (TID) | ORAL | Status: DC | PRN

## 2023-02-15 MED ORDER — PROPOFOL 10 MG/ML IV BOLUS
INTRAVENOUS | Status: DC | PRN
  Administered 2023-02-15: 30 mg via INTRAVENOUS
  Administered 2023-02-15: 170 mg via INTRAVENOUS
  Administered 2023-02-15: 30 mg via INTRAVENOUS

## 2023-02-15 MED ORDER — OXYCODONE HCL 5 MG PO TABS: 5.0000 mg | ORAL_TABLET | Freq: Once | ORAL | Status: DC | PRN

## 2023-02-15 MED ORDER — LIDOCAINE HCL (PF) 2 % IJ SOLN
INTRAMUSCULAR | Status: AC
  Filled 2023-02-15: qty 5

## 2023-02-15 MED ORDER — LIDOCAINE HCL (CARDIAC) PF 100 MG/5ML IV SOSY
PREFILLED_SYRINGE | INTRAVENOUS | Status: DC | PRN
  Administered 2023-02-15: 80 mg via INTRAVENOUS

## 2023-02-15 MED ORDER — INSULIN ASPART 100 UNIT/ML IJ SOLN
0.0000 [IU] | Freq: Three times a day (TID) | INTRAMUSCULAR | Status: DC
  Administered 2023-02-16 (×2): 3 [IU] via SUBCUTANEOUS
  Administered 2023-02-17: 2 [IU] via SUBCUTANEOUS

## 2023-02-15 MED ORDER — DOCUSATE SODIUM 100 MG PO CAPS
100.0000 mg | ORAL_CAPSULE | Freq: Two times a day (BID) | ORAL | Status: DC
  Administered 2023-02-15 – 2023-02-17 (×4): 100 mg via ORAL
  Filled 2023-02-15 (×4): qty 1

## 2023-02-15 MED ORDER — METHOCARBAMOL 1000 MG/10ML IJ SOLN: 500.0000 mg | Freq: Four times a day (QID) | INTRAVENOUS | Status: DC | PRN

## 2023-02-15 MED ORDER — METHOCARBAMOL 500 MG PO TABS
500.0000 mg | ORAL_TABLET | Freq: Four times a day (QID) | ORAL | Status: DC | PRN
  Administered 2023-02-16 (×2): 500 mg via ORAL
  Filled 2023-02-15 (×2): qty 1

## 2023-02-15 MED ORDER — CHLORHEXIDINE GLUCONATE 4 % EX SOLN: 60.0000 mL | Freq: Once | CUTANEOUS | Status: DC

## 2023-02-15 MED ORDER — METFORMIN HCL 500 MG PO TABS
500.0000 mg | ORAL_TABLET | Freq: Two times a day (BID) | ORAL | Status: DC
  Administered 2023-02-15 – 2023-02-17 (×5): 500 mg via ORAL
  Filled 2023-02-15 (×5): qty 1

## 2023-02-15 SURGICAL SUPPLY — 75 items
APL PRP STRL LF DISP 70% ISPRP (MISCELLANEOUS) ×2
BAG COUNTER SPONGE SURGICOUNT (BAG) IMPLANT
BAG SPEC THK2 15X12 ZIP CLS (MISCELLANEOUS) ×1
BAG SPNG CNTER NS LX DISP (BAG)
BAG ZIPLOCK 12X15 (MISCELLANEOUS) ×2 IMPLANT
BANDAGE ESMARK 6X9 LF (GAUZE/BANDAGES/DRESSINGS) IMPLANT
BIT DRILL 2.4X140 LONG SOLID (BIT) IMPLANT
BIT DRILL 2.5X2.75 QC CALB (BIT) IMPLANT
BIT DRILL CALIBRATED 2.7 (BIT) IMPLANT
BLADE SURG 15 STRL LF DISP TIS (BLADE) ×4 IMPLANT
BLADE SURG 15 STRL SS (BLADE) ×2
BNDG CMPR 5X4 KNIT ELC UNQ LF (GAUZE/BANDAGES/DRESSINGS) ×1
BNDG CMPR 5X6 CHSV STRCH STRL (GAUZE/BANDAGES/DRESSINGS) ×1
BNDG CMPR 5X62 HK CLSR LF (GAUZE/BANDAGES/DRESSINGS) ×1
BNDG CMPR 9X6 STRL LF SNTH (GAUZE/BANDAGES/DRESSINGS) ×1
BNDG CMPR MD 5X2 ELC HKLP STRL (GAUZE/BANDAGES/DRESSINGS) ×2
BNDG CMPR MED 10X6 ELC LF (GAUZE/BANDAGES/DRESSINGS) ×2
BNDG COHESIVE 6X5 TAN ST LF (GAUZE/BANDAGES/DRESSINGS) IMPLANT
BNDG ELASTIC 2 VLCR STRL LF (GAUZE/BANDAGES/DRESSINGS) IMPLANT
BNDG ELASTIC 4INX 5YD STR LF (GAUZE/BANDAGES/DRESSINGS) ×2 IMPLANT
BNDG ELASTIC 6INX 5YD STR LF (GAUZE/BANDAGES/DRESSINGS) ×2 IMPLANT
BNDG ELASTIC 6X10 VLCR STRL LF (GAUZE/BANDAGES/DRESSINGS) IMPLANT
BNDG ESMARK 6X9 LF (GAUZE/BANDAGES/DRESSINGS) ×1
BNDG GAUZE DERMACEA FLUFF 4 (GAUZE/BANDAGES/DRESSINGS) ×2 IMPLANT
BNDG GZE DERMACEA 4 6PLY (GAUZE/BANDAGES/DRESSINGS) ×1
CHLORAPREP W/TINT 26 (MISCELLANEOUS) ×4 IMPLANT
COVER SURGICAL LIGHT HANDLE (MISCELLANEOUS) ×2 IMPLANT
CUFF TOURN SGL QUICK 34 (TOURNIQUET CUFF) ×1
CUFF TRNQT CYL 34X4.125X (TOURNIQUET CUFF) ×2 IMPLANT
DRAPE C-ARM 42X120 X-RAY (DRAPES) IMPLANT
DRAPE C-ARMOR (DRAPES) ×2 IMPLANT
DRAPE EXTREMITY T 121X128X90 (DISPOSABLE) ×2 IMPLANT
DRAPE U-SHAPE 47X51 STRL (DRAPES) ×2 IMPLANT
DRSG MEPILEX POST OP 4X12 (GAUZE/BANDAGES/DRESSINGS) IMPLANT
ELECT REM PT RETURN 15FT ADLT (MISCELLANEOUS) ×2 IMPLANT
FIXATION ZIPTIGHT ANKLE SNDSMS (Ankle) IMPLANT
GAUZE PAD ABD 8X10 STRL (GAUZE/BANDAGES/DRESSINGS) ×10 IMPLANT
GAUZE SPONGE 4X4 12PLY STRL (GAUZE/BANDAGES/DRESSINGS) ×4 IMPLANT
GAUZE XEROFORM 1X8 LF (GAUZE/BANDAGES/DRESSINGS) ×2 IMPLANT
GLOVE BIOGEL PI IND STRL 7.5 (GLOVE) ×4 IMPLANT
GLOVE INDICATOR 7.5 STRL GRN (GLOVE) ×2 IMPLANT
GOWN STRL REUS W/ TWL LRG LVL3 (GOWN DISPOSABLE) ×2 IMPLANT
GOWN STRL REUS W/TWL LRG LVL3 (GOWN DISPOSABLE) ×1
K-WIRE ACE 1.6X6 (WIRE) ×4
KIT BASIN OR (CUSTOM PROCEDURE TRAY) ×2 IMPLANT
KIT TURNOVER KIT A (KITS) IMPLANT
KWIRE ACE 1.6X6 (WIRE) IMPLANT
NS IRRIG 1000ML POUR BTL (IV SOLUTION) ×2 IMPLANT
PACK TOTAL JOINT (CUSTOM PROCEDURE TRAY) ×2 IMPLANT
PAD CAST 3X4 CTTN HI CHSV (CAST SUPPLIES) IMPLANT
PADDING CAST COTTON 3X4 STRL (CAST SUPPLIES) ×4
PADDING CAST COTTON 6X4 STRL (CAST SUPPLIES) IMPLANT
PLATE LOCK 8H 103 BILAT FIB (Plate) IMPLANT
PLATE MEDIAL MALLEOLUS 2H RT (Plate) IMPLANT
PLATE MEDIAL MALLEOLUS 4H HOOK (Plate) IMPLANT
PROTECTOR NERVE ULNAR (MISCELLANEOUS) ×2 IMPLANT
SCREW CANN NL 3.5X40 (Screw) IMPLANT
SCREW LOCK 3.5X16 DIST TIB (Screw) IMPLANT
SCREW LOCK CORT STAR 3.5X12 (Screw) IMPLANT
SCREW LOCK CORT STAR 3.5X16 (Screw) IMPLANT
SCREW LP 3.5X65MM (Screw) IMPLANT
SCREW NON LOCKING LP 3.5 14MM (Screw) IMPLANT
SCREW RE3CON NL 3.5X30 (Screw) IMPLANT
SPLINT PLASTER CAST XFAST 5X30 (CAST SUPPLIES) IMPLANT
STOCKINETTE 4X48 STRL (DRAPES) ×2 IMPLANT
SUCTION FRAZIER HANDLE 10FR (MISCELLANEOUS) ×1
SUCTION TUBE FRAZIER 10FR DISP (MISCELLANEOUS) ×2 IMPLANT
SUT ETHILON 3 0 PS 1 (SUTURE) ×4 IMPLANT
SUT VIC AB 2-0 SH 27 (SUTURE) ×2
SUT VIC AB 2-0 SH 27X BRD (SUTURE) IMPLANT
SUT VIC AB 2-0 SH 27XBRD (SUTURE) ×2 IMPLANT
SUT VIC AB 3-0 SH 27 (SUTURE) ×2
SUT VIC AB 3-0 SH 27X BRD (SUTURE) ×4 IMPLANT
WATER STERILE IRR 1000ML POUR (IV SOLUTION) ×2 IMPLANT
ZIPTIGHT ANKLE SYNODESMOSS FIX (Ankle) ×1 IMPLANT

## 2023-02-15 NOTE — Anesthesia Procedure Notes (Signed)
Anesthesia Regional Block: Adductor canal block   Pre-Anesthetic Checklist: , timeout performed,  Correct Patient, Correct Site, Correct Laterality,  Correct Procedure, Correct Position, site marked,  Risks and benefits discussed,  Surgical consent,  Pre-op evaluation,  At surgeon's request and post-op pain management  Laterality: Right  Prep: chloraprep       Needles:  Injection technique: Single-shot  Needle Type: Echogenic Stimulator Needle     Needle Length: 10cm  Needle Gauge: 21   Needle insertion depth: 7 cm   Additional Needles:   Procedures:,,,, ultrasound used (permanent image in chart),,    Narrative:  Start time: 02/15/2023 11:17 AM End time: 02/15/2023 11:22 AM Injection made incrementally with aspirations every 5 mL.  Performed by: Other  Anesthesiologist: Mal Amabile, MD  Additional Notes: Timeout performed. Patient sedated. Relevant anatomy ID'd using [place]. Incremental 2-92ml injection of LA with frequent aspiration. Patient tolerated procedure well.

## 2023-02-15 NOTE — H&P (Signed)
H&P Update:  -History and Physical Reviewed  -Patient has been re-examined  -No change in the plan of care  -The risks and benefits were presented and reviewed. The risks due to hardware/suture failure and/or irritation, new/persistent infection, stiffness, nerve/vessel/tendon injury or rerupture of repaired tendon, nonunion/malunion, allograft usage, wound healing issues, development of arthritis, failure of this surgery, possibility of external fixation with delayed definitive surgery, need for further surgery, thromboembolic events, anesthesia/medical complications, amputation, death among others were discussed. The patient acknowledged the explanation, agreed to proceed with the plan and a consent was signed.  Brent Byrd  

## 2023-02-15 NOTE — Op Note (Signed)
02/15/2023  3:31 PM   PATIENT: Brent Byrd.  67 y.o. male  MRN: [id number]   PRE-OPERATIVE DIAGNOSIS:   Closed trimalleolar fracture of right ankle   POST-OPERATIVE DIAGNOSIS:   Closed trimalleolar fracture of right ankle   PROCEDURE: 1] Right ankle trimalleolar open reduction internal fixation without fixation of posterior malleolus 2] Right ankle syndesmosis open reduction internal fixation   SURGEON:  Netta Cedars, MD   ASSISTANT: None   ANESTHESIA: General, regional   EBL: Minimal   TOURNIQUET:    Total Tourniquet Time Documented: Thigh (Right) - 136 minutes Total: Thigh (Right) - 136 minutes    COMPLICATIONS: None apparent   DISPOSITION: Extubated, awake and stable to recovery.   INDICATION FOR PROCEDURE: The patient presented with above diagnosis.  We discussed the diagnosis, alternative treatment options, risks and benefits of the above surgical intervention, as well as alternative non-operative treatments. All questions/concerns were addressed and the patient/family demonstrated appropriate understanding of the diagnosis, the procedure, the postoperative course, and overall prognosis. The patient wished to proceed with surgical intervention and signed an informed surgical consent as such, in each others presence prior to surgery.   PROCEDURE IN DETAIL: After preoperative consent was obtained and the correct operative site was identified, the patient was brought to the operating room supine on stretcher and transferred onto operating table. General anesthesia was induced. Preoperative antibiotics were administered. Surgical timeout was taken. The patient was then positioned supine with an ipsilateral hip bump. The operative lower extremity was prepped and draped in standard sterile fashion with a tourniquet around the thigh. The extremity was exsanguinated and the tourniquet was inflated to 275 mmHg.  A standard lateral incision was made over  the distal fibula. Dissection was carried down to the level of the fibula and the fracture site identified. The superficial peroneal nerve was identified and protected throughout the procedure. The fibula was noted to be shortened with interposed periosteum. The fibula was brought out to length. The fibula fracture was debrided and the edges defined to achieve cortical read. Reduction maneuver was performed using pointed reduction forceps and lobster forceps. In this manner, the fibula length was restored and fracture reduced. A lag screw was not placed given the orientation of fracture lines and comminution. Due to poor bone quality and extensive comminution at the fracture site, it was decided to use a locking distal fibula plate. We then selected a Zimmer locking plate to match the anatomy of the distal fibula and placed it laterally. This was implanted under intraoperative fluoroscopy with a combination of distal locking screws and proximal cortical & locking screws.  We then made a direct medial ankle approach and extended this proximally in anticipation of implanting a hook plate. Dissection was carried down to the level of the medial malleolar fragment. A dental pick and freer elevator were used to reduce the medial malleolar fragment. Of note, there was extensive comminution of this fragment into multiple segments, all of which had very poor bone quality. A Paragon28 medial distal tibia hook plate was utilized to fix the reduced medial malleolar fragment and the tines were carefully inserted into the distal tip of the malleolus. The plate was oriented to best capture the major fragments of the medial comminution. We placed a non-locking screw in the hook plate and subsequently implanted another non-locking screw proximally to further secure the plate.  A manual external rotation stress radiograph was obtained and demonstrated widening of the ankle mortise. Given this intraoperative finding as well  as  preoperative subluxation, it was decided to reduce and fix the syndesmosis. Therefore a suture fixation system (ZipTight device) was implanted through the fibula plate in cannulated fashion to fix the syndesmosis. Anchor/button position was verified along anteromedial tibial cortex by fluoroscopy. A repeat stress radiograph showed complete stability of the ankle mortise to testing.  The surgical sites were thoroughly irrigated. The tourniquet was deflated and hemostasis achieved. The deep layers were closed using 2-0 vicryl. The skin was closed without tension using 2-0 nylon suture.    The leg was cleaned with saline and sterile dressings with gauze were applied. Vancomycin powder applied. A well padded bulky short leg splint was applied. The patient was awakened from anesthesia and transported to the recovery room in stable condition.    FOLLOW UP PLAN: -transfer to PACU, then admit for observation overnight with planned discharge home POD1 -strict NWB operative extremity, maximum elevation -maintain short leg splint until follow up -DVT ppx: Resume Eliquis 48-72 hr postop -follow up as outpatient within 7-10 days for wound check with exchange of short leg splint to short leg cast -sutures out in 3 weeks in outpatient office   RADIOGRAPHS: AP, lateral, oblique and stress radiographs of the right ankle were obtained intraoperatively. These showed interval reduction and fixation of the fractures. Manual stress radiographs were taken and the ankle mortise and tibiofibular relationship were noted to be stable following fixation. All hardware is appropriately positioned and of the appropriate lengths. No other acute injuries are noted.   Netta Cedars Orthopaedic Surgery [facility]

## 2023-02-15 NOTE — Anesthesia Procedure Notes (Signed)
Anesthesia Regional Block: Popliteal block   Pre-Anesthetic Checklist: , timeout performed,  Correct Patient, Correct Site, Correct Laterality,  Correct Procedure, Correct Position, site marked,  Risks and benefits discussed,  Surgical consent,  Pre-op evaluation,  At surgeon's request and post-op pain management  Laterality: Right  Prep: chloraprep       Needles:  Injection technique: Single-shot  Needle Type: Echogenic Stimulator Needle     Needle Length: 10cm  Needle Gauge: 21   Needle insertion depth: 7 cm   Additional Needles:   Procedures:,,,, ultrasound used (permanent image in chart),,   Motor weakness within 7 minutes.  Narrative:  Start time: 02/15/2023 11:12 AM End time: 02/15/2023 11:17 AM Injection made incrementally with aspirations every 5 mL.  Performed by: Personally  Anesthesiologist: Mal Amabile, MD  Additional Notes: Timeout performed. Patient sedated. Relevant anatomy ID'd using [place]. Incremental 2-58ml injection of LA with frequent aspiration. Patient tolerated procedure well.

## 2023-02-15 NOTE — Anesthesia Procedure Notes (Signed)
Procedure Name: LMA Insertion Date/Time: 02/15/2023 11:39 AM  Performed by: Karoline Caldwell, CRNAPre-anesthesia Checklist: Patient identified, Patient being monitored, Timeout performed, Emergency Drugs available and Suction available Patient Re-evaluated:Patient Re-evaluated prior to induction Oxygen Delivery Method: Circle system utilized Preoxygenation: Pre-oxygenation with 100% oxygen Induction Type: IV induction Ventilation: Mask ventilation without difficulty LMA: LMA inserted LMA Size: 5.0 Tube type: Oral Number of attempts: 1 Placement Confirmation: positive ETCO2 and breath sounds checked- equal and bilateral Tube secured with: Tape Dental Injury: Teeth and Oropharynx as per pre-operative assessment

## 2023-02-15 NOTE — Plan of Care (Signed)
  Problem: Activity: Goal: Risk for activity intolerance will decrease Outcome: Progressing   Problem: Pain Managment: Goal: General experience of comfort will improve Outcome: Progressing   Problem: Safety: Goal: Ability to remain free from injury will improve Outcome: Progressing   

## 2023-02-15 NOTE — Transfer of Care (Signed)
Immediate Anesthesia Transfer of Care Note  Patient: Brent Byrd.  Procedure(s) Performed: Right ankle trimalleolar open reduction internal fixation, possible syndesmosis and/or deltoid fixation, possible allograft, possible external fixation (Right: Ankle)  Patient Location: PACU  Anesthesia Type:General  Level of Consciousness: awake, alert , and oriented  Airway & Oxygen Therapy: Patient Spontanous Breathing  Post-op Assessment: Report given to RN and Post -op Vital signs reviewed and stable  Post vital signs: Reviewed and stable  Last Vitals:  Vitals Value Taken Time  BP 119/76 02/15/23 1430  Temp    Pulse 69 02/15/23 1431  Resp 19 02/15/23 1431  SpO2 92 % 02/15/23 1431  Vitals shown include unvalidated device data.  Last Pain:  Vitals:   02/15/23 1125  TempSrc:   PainSc: 0-No pain         Complications: No notable events documented.

## 2023-02-16 DIAGNOSIS — S82851A Displaced trimalleolar fracture of right lower leg, initial encounter for closed fracture: Secondary | ICD-10-CM | POA: Diagnosis not present

## 2023-02-16 LAB — BASIC METABOLIC PANEL
Anion gap: 9 (ref 5–15)
BUN: 15 mg/dL (ref 8–23)
CO2: 24 mmol/L (ref 22–32)
Calcium: 8.3 mg/dL — ABNORMAL LOW (ref 8.9–10.3)
Chloride: 101 mmol/L (ref 98–111)
Creatinine, Ser: 0.94 mg/dL (ref 0.61–1.24)
GFR, Estimated: >60 mL/min (ref >60)
Glucose, Bld: 108 mg/dL — ABNORMAL HIGH (ref 70–99)
Potassium: 4.5 mmol/L (ref 3.5–5.1)
Sodium: 134 mmol/L — ABNORMAL LOW (ref 135–145)

## 2023-02-16 LAB — CBC
HCT: 36 % — ABNORMAL LOW (ref 39.0–52.0)
Hemoglobin: 11.4 g/dL — ABNORMAL LOW (ref 13.0–17.0)
MCH: 27.6 pg (ref 26.0–34.0)
MCHC: 31.7 g/dL (ref 30.0–36.0)
MCV: 87.2 fL (ref 80.0–100.0)
Platelets: 250 10*3/uL (ref 150–400)
RBC: 4.13 MIL/uL — ABNORMAL LOW (ref 4.22–5.81)
RDW: 18.1 % — ABNORMAL HIGH (ref 11.5–15.5)
WBC: 7.1 10*3/uL (ref 4.0–10.5)
nRBC: 0 % (ref 0.0–0.2)

## 2023-02-16 LAB — GLUCOSE, CAPILLARY
Glucose-Capillary: 166 mg/dL — ABNORMAL HIGH (ref 70–99)
Glucose-Capillary: 174 mg/dL — ABNORMAL HIGH (ref 70–99)
Glucose-Capillary: 94 mg/dL (ref 70–99)
Glucose-Capillary: 126 mg/dL — ABNORMAL HIGH (ref 70–99)

## 2023-02-16 NOTE — Anesthesia Postprocedure Evaluation (Signed)
Anesthesia Post Note  Patient: Brent Byrd.  Procedure(s) Performed: Right ankle trimalleolar open reduction internal fixation, possible syndesmosis and/or deltoid fixation, possible allograft, possible external fixation (Right: Ankle)     Patient location during evaluation: PACU Anesthesia Type: Regional and General Level of consciousness: awake and alert Pain management: pain level controlled Vital Signs Assessment: post-procedure vital signs reviewed and stable Respiratory status: spontaneous breathing, nonlabored ventilation, respiratory function stable and patient connected to nasal cannula oxygen Cardiovascular status: blood pressure returned to baseline and stable Postop Assessment: no apparent nausea or vomiting Anesthetic complications: no   No notable events documented.  Last Vitals:  Vitals:   02/16/23 1006 02/16/23 1339  BP: 116/81 120/77  Pulse: 84 85  Resp: 18   Temp: 36.8 C 36.8 C  SpO2: 92% 93%    Last Pain:  Vitals:   02/16/23 1429  TempSrc:   PainSc: 4                  Shelton Silvas

## 2023-02-16 NOTE — Evaluation (Signed)
Physical Therapy Evaluation Patient Details Name: Brent Byrd. MRN: [id number] DOB: July 15, 1956 Today's Date: 02/16/2023  History of Present Illness  67 year old male patient presents with right trimalleolar ankle fracture sustained on 02/06/23 when he fell down the stairs. He was discharged for outpatient follow up and scheduled for surgery once swelling permitted. Pt returned on 02/15/23 and underwent Right ankle trimalleolar open reduction internal fixation without fixation of posterior malleolus, and Right ankle syndesmosis open reduction internal fixation under Netta Cedars, MD.   His PMH is notable for EtOH abuse, COPD, smoker, DVT, depression, GERD, HTN, remote MI s/p cath, Pre-diabetes, Prior CVA.  Clinical Impression  Pt admitted with above diagnosis. Pt was able to pivot from elevated bed to recliner with RW without physical assist. Ambulation deferred 2* pain/fatigue in L knee, pt stated L knee has severe OA which has been aggravated by recent use of crutches and NWB status RLE. Pt has 4 steps to enter home, he states they are in poor condition. Pt reports he was told he will be transported home by ambulance.  Pt currently with functional limitations due to the deficits listed below (see PT Problem List). Pt will benefit from acute skilled PT to increase their independence and safety with mobility to allow discharge.          Recommendations for follow up therapy are one component of a multi-disciplinary discharge planning process, led by the attending physician.  Recommendations may be updated based on patient status, additional functional criteria and insurance authorization.  Follow Up Recommendations       Assistance Recommended at Discharge Intermittent Supervision/Assistance  Patient can return home with the following  A little help with bathing/dressing/bathroom;Assistance with cooking/housework;Assist for transportation;Help with stairs or ramp for entrance     Equipment Recommendations Rolling walker (2 wheels);BSC/3in1;Wheelchair (measurements PT);Other (comment);Wheelchair cushion (measurements PT) [facility] with elevating leg rests)  Recommendations for Other Services       Functional Status Assessment Patient has had a recent decline in their functional status and demonstrates the ability to make significant improvements in function in a reasonable and predictable amount of time.     Precautions / Restrictions Precautions Precautions: Fall Restrictions Weight Bearing Restrictions: Yes RLE Weight Bearing: Non weight bearing      Mobility  Bed Mobility Overal bed mobility: Modified Independent                  Transfers Overall transfer level: Needs assistance   Transfers: Sit to/from Stand, Bed to chair/wheelchair/BSC Sit to Stand: Min guard Stand pivot transfers: Min guard         General transfer comment: VCs for hand placement with stand to sit    Ambulation/Gait               General Gait Details: ambulation deferred 2* L knee pain/fatigue (pt reports h/o OA and that he needs a TKA, L knee was painful prior to this surgery since he was using crutches and was NWB RLE)  Stairs            Wheelchair Mobility    Modified Rankin (Stroke Patients Only)       Balance Overall balance assessment: History of Falls, Needs assistance   Sitting balance-Leahy Scale: Good     Standing balance support: Reliant on assistive device for balance, During functional activity Standing balance-Leahy Scale: Poor Standing balance comment: NWB RLE  Pertinent Vitals/Pain Pain Assessment Pain Score: 8  Pain Location: RT ankle Pain Descriptors / Indicators: Aching Pain Intervention(s): Limited activity within patient's tolerance, Monitored during session, Repositioned, Premedicated before session    Home Living Family/patient expects to be discharged to:: Private  residence Living Arrangements: Spouse/significant other Available Help at Discharge: Available 24 hours/day (Spouse uses a walker due to bad knees) Type of Home: Apartment Home Access: Stairs to enter   [employer] of Steps: 4- uneven and in poor repair. Rail on LT is very shakey and unreliable.   Pt reports plan to move to a handicapped accessible apartment asap.   Home Layout: One level Home Equipment: Crutches      Prior Function Prior Level of Function : History of Falls (last six months);Independent/Modified Independent;Driving;Working/employed             Mobility Comments: Fell at steps to apartment. Steps uneven, pine needles rather than pavement. 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   Dominant Hand: Right    Extremity/Trunk Assessment   Upper Extremity Assessment Upper Extremity Assessment: Defer to OT evaluation    Lower Extremity Assessment Lower Extremity Assessment: RLE deficits/detail RLE Deficits / Details: able to perform SLR and knee extension independently, reports sensation intact to light touch at toes LLE Deficits / Details: NWB       Communication   Communication: No difficulties  Cognition Arousal/Alertness: Awake/alert Behavior During Therapy: WFL for tasks assessed/performed Overall Cognitive Status: Within Functional Limits for tasks assessed                                          General Comments      Exercises     Assessment/Plan    PT Assessment Patient needs continued PT services  PT Problem List Decreased activity tolerance;Decreased mobility       PT Treatment Interventions Functional mobility training;Therapeutic activities;Therapeutic exercise;Gait training;Patient/family education    PT Goals (Current goals can be found in the Care Plan section)  Acute Rehab PT Goals Patient Stated Goal: decrease pain PT Goal Formulation: With patient Time For Goal Achievement:  02/23/23 Potential to Achieve Goals: Good    Frequency Min 1X/week     Co-evaluation PT/OT/SLP Co-Evaluation/Treatment: Yes Reason for Co-Treatment: Complexity of the patient's impairments (multi-system involvement) PT goals addressed during session: Mobility/safety with mobility OT goals addressed during session: ADL's and self-care       AM-PAC PT "6 Clicks" Mobility  Outcome Measure Help needed turning from your back to your side while in a flat bed without using bedrails?: None Help needed moving from lying on your back to sitting on the side of a flat bed without using bedrails?: None Help needed moving to and from a bed to a chair (including a wheelchair)?: A Little Help needed standing up from a chair using your arms (e.g., wheelchair or bedside chair)?: None Help needed to walk in hospital room?: A Lot Help needed climbing 3-5 steps with a railing? : Total 6 Click Score: 18    End of Session Equipment Utilized During Treatment: Gait belt Activity Tolerance: Patient limited by fatigue;Patient limited by pain Patient left: in chair;with chair alarm set;with call bell/phone within reach Nurse Communication: Mobility status PT Visit Diagnosis: Difficulty in walking, not elsewhere classified (R26.2);Unsteadiness on feet (R26.81);Pain Pain - Right/Left: Right Pain - part of body: Ankle and  joints of foot    Time: 4098-1191 PT Time Calculation (min) (ACUTE ONLY): 15 min   Charges:   PT Evaluation $PT Eval Moderate Complexity: 1 Mod          Tamala Ser PT 02/16/2023  Acute Rehabilitation Services  Office [phone]

## 2023-02-16 NOTE — TOC Initial Note (Signed)
Transition of Care (TOC) - Initial/Assessment Note   Patient Details  Name: Brent Byrd. MRN: [id number] Date of Birth: 1955/11/02  Transition of [facility]) CM/SW Contact:    Ewing Schlein, LCSW Phone Number: 02/16/2023, 4:05 PM  Clinical Narrative: Patient will need a lightweight wheelchair and 3N1 at discharge. However, the previous DME order for a wheelchair was not accepted by [employer] as it needs to specifically indicate lightweight. [employer] unable to deliver DME to patient's apartment until the order is corrected and the narratives for a wheelchair and 3N1 have been completed with the DME orders added. TOC will need a correct DME order in order to complete the required narratives. Patient aware and RN updated.  Expected Discharge Plan: Home/Self Care Barriers to Discharge: Other (must enter comment) (Need corrected DME orders)  Patient Goals and CMS Choice Patient states their goals for this hospitalization and ongoing recovery are:: Go home with DME CMS Medicare.gov Compare Post Acute Care list provided to:: Patient Choice offered to / list presented to : Patient  Expected Discharge Plan and Services In-house Referral: Clinical Social Work Post Acute Care Choice: Durable Medical Equipment Living arrangements for the past 2 months: Apartment Expected Discharge Date: 02/16/23               DME Arranged: Community education officer wheelchair with seat cushion, 3-N-1 DME Agency: [employer] Date DME Agency Contacted: 02/16/23 Representative spoke with at DME Agency: Barbara Cower  Prior Living Arrangements/Services Living arrangements for the past 2 months: Apartment Lives with:: Self Patient language and need for interpreter reviewed:: Yes Do you feel safe going back to the place where you live?: Yes      Need for Family Participation in Patient Care: No (Comment) Care giver support system in place?: Yes (comment) Criminal Activity/Legal Involvement Pertinent to Current  Situation/Hospitalization: No - Comment as needed  Activities of Daily Living Home Assistive Devices/Equipment: CBG Meter, Crutches, Blood pressure cuff ADL Screening (condition at time of admission) Patient's cognitive ability adequate to safely complete daily activities?: Yes Is the patient deaf or have difficulty hearing?: Yes Does the patient have difficulty seeing, even when wearing glasses/contacts?: No Does the patient have difficulty concentrating, remembering, or making decisions?: No Patient able to express need for assistance with ADLs?: Yes Does the patient have difficulty dressing or bathing?: No Independently performs ADLs?: Yes (appropriate for developmental age) Does the patient have difficulty walking or climbing stairs?: Yes Weakness of Legs: None Weakness of Arms/Hands: None  Permission Sought/Granted Permission sought to share information with : Other (comment) Permission granted to share information with : Yes, Verbal Permission Granted Permission granted to share info w AGENCY: DME company  Emotional Assessment Appearance:: Appears stated age Attitude/Demeanor/Rapport: Engaged Affect (typically observed): Accepting Orientation: : Oriented to Self, Oriented to Place, Oriented to  Time, Oriented to Situation Psych Involvement: No (comment)  Admission diagnosis:  Postoperative state [Z98.890] Patient Active Problem List   Diagnosis Date Noted   Postoperative state 02/15/2023   BPH with obstruction/lower urinary tract symptoms 10/13/2022   Abnormal nuclear stress test    Headache syndrome 07/24/2020   PNA (pneumonia) 01/04/2020   COVID-19 01/04/2020   Community acquired pneumonia 10/25/2016   Acute respiratory failure with hypoxia 10/25/2016   Rhinitis, non-allergic 10/25/2016   Dyspnea on exertion 04/13/2016   Hypotension 04/13/2016   Acute respiratory distress 04/13/2016   Substance abuse    Chest pain    COPD (chronic obstructive pulmonary disease)     Gastroesophageal reflux disease  Hypertension    Depression    Tobacco abuse    Alcohol abuse    Cigarette smoker 05/18/2015   Asthmatic bronchitis with acute exacerbation 05/17/2015   PCP:  Fleet Contras, MD Pharmacy:   [facility] [phone] Ginette Otto, [place] - [address] [place] [address] Phone: [phone] Fax: [phone]  [facility] [address] [place] [address] Phone: [phone] Fax: [phone]  Social Determinants of Health (SDOH) Social History: SDOH Screenings   Food Insecurity: No Food Insecurity (02/15/2023)  Housing: Low Risk  (02/15/2023)  Transportation Needs: No Transportation Needs (02/15/2023)  Utilities: Not At Risk (02/15/2023)  Tobacco Use: High Risk (02/15/2023)   SDOH Interventions:    Readmission Risk Interventions     No data to display

## 2023-02-16 NOTE — Evaluation (Signed)
Occupational Therapy Evaluation Patient Details Name: Brent Byrd. MRN: [id number] DOB: 1955/11/27 Today's Date: 02/16/2023   History of Present Illness 67 year old male patient presents with right trimalleolar ankle fracture sustained on 02/06/23 when he fell down the stairs. He was discharged for outpatient follow up and scheduled for surgery once swelling permitted. Pt returned on 02/15/23 and underwent Right ankle trimalleolar open reduction internal fixation without fixation of posterior malleolus, and Right ankle syndesmosis open reduction internal fixation under Netta Cedars, MD.   His PMH is notable for EtOH abuse, COPD, smoker, DVT, depression, GERD, HTN, remote MI s/p cath, Pre-diabetes, Prior CVA.   Clinical Impression   Patient evaluated by Occupational Therapy with no further acute OT needs identified. All education has been completed and the patient has no further questions. Pt may benefit from home health PT and OT to help navigate home environment but will defer to surgeon's preference for this.  See below for any follow-up Occupational Therapy or equipment needs. OT is signing off. Thank you for this referral.       Recommendations for follow up therapy are one component of a multi-disciplinary discharge planning process, led by the attending physician.  Recommendations may be updated based on patient status, additional functional criteria and insurance authorization.   Assistance Recommended at Discharge Set up Supervision/Assistance  Patient can return home with the following A little help with walking and/or transfers;A little help with bathing/dressing/bathroom;Assistance with cooking/housework;Assist for transportation;Help with stairs or ramp for entrance    Functional Status Assessment  Patient has had a recent decline in their functional status and demonstrates the ability to make significant improvements in function in a reasonable and predictable amount of time.   Equipment Recommendations  BSC/3in1;Wheelchair (measurements OT);Other (comment) (Tall 2 wheeled RW.  Manual WC with leg rests.)    Recommendations for Other Services       Precautions / Restrictions Precautions Precautions: Fall Restrictions Weight Bearing Restrictions: Yes RLE Weight Bearing: Non weight bearing      Mobility Bed Mobility Overal bed mobility: Modified Independent                  Transfers  See ADL section                        Balance Overall balance assessment: History of Falls, Needs assistance   Sitting balance-Leahy Scale: Good     Standing balance support: Reliant on assistive device for balance, During functional activity Standing balance-Leahy Scale: Poor Standing balance comment: NWB RLE                           ADL either performed or assessed with clinical judgement   ADL Overall ADL's : Needs assistance/impaired Eating/Feeding: Independent   Grooming: Set up;Sitting   Upper Body Bathing: Sitting;Set up   Lower Body Bathing: Set up;Sitting/lateral leans;Bed level   Upper Body Dressing : Set up;Sitting   Lower Body Dressing: Minimal assistance;Bed level;Sitting/lateral leans Lower Body Dressing Details (indicate cue type and reason): Min As to ease clothing over large RT cast. Toilet Transfer: Min guard Toilet Transfer Details (indicate cue type and reason): Pt performed sit to stand from elevated EOB to RW with Min guard. Pt pivoted to drop arm recliner with Min Guard. Pt declined taking more steps. Increased effort with transfer and cues for UE reach back to ease descent. Toileting- Clothing Manipulation and Hygiene: Min guard;Sitting/lateral lean;Bed  level       Functional mobility during ADLs: Min guard;Cueing for safety;Rolling walker (2 wheels) General ADL Comments: Pt following NWB well after instructions.     Vision Baseline Vision/History: 1 Wears glasses Vision Assessment?: No apparent  visual deficits     Perception     Praxis      Pertinent Vitals/Pain Pain Assessment Pain Assessment: 0-10 Pain Score: 8  Pain Location: RT ankle Pain Intervention(s): Limited activity within patient's tolerance, Premedicated before session     Hand Dominance Right   Extremity/Trunk Assessment Upper Extremity Assessment Upper Extremity Assessment: Overall WFL for tasks assessed   Lower Extremity Assessment Lower Extremity Assessment: LLE deficits/detail LLE Deficits / Details: NWB       Communication Communication Communication: No difficulties   Cognition Arousal/Alertness: Awake/alert Behavior During Therapy: WFL for tasks assessed/performed Overall Cognitive Status: Within Functional Limits for tasks assessed                                       General Comments       Exercises     Shoulder Instructions      Home Living Family/patient expects to be discharged to:: Private residence Living Arrangements: Spouse/significant other Available Help at Discharge: Available 24 hours/day (Spouse uses a walker due to bad knees) Type of Home: Apartment Home Access: Stairs to enter [employer] of Steps: 4- uneven and in poor repair. Rail on LT is very shakey and unreliable.   Pt reports plan to move to a handicapped accessible apartment asap.   Home Layout: One level     Bathroom Shower/Tub: Tub/shower unit;Curtain   Firefighter: Standard     Home Equipment: Crutches          Prior Functioning/Environment Prior Level of Function : History of Falls (last six months);Independent/Modified Independent;Driving;Working/employed             Mobility Comments: Fell at steps to apartment. Steps uneven, pine needles rather than pavement. 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        OT Problem List: Impaired balance (sitting and/or standing);Pain      OT Treatment/Interventions:      OT Goals(Current goals can be found  in the care plan section) Acute Rehab OT Goals Patient Stated Goal: Go home today OT Goal Formulation: All assessment and education complete, DC therapy  OT Frequency:      Co-evaluation PT/OT/SLP Co-Evaluation/Treatment: Yes Reason for Co-Treatment: Complexity of the patient's impairments (multi-system involvement) PT goals addressed during session: Mobility/safety with mobility OT goals addressed during session: ADL's and self-care      AM-PAC OT "6 Clicks" Daily Activity     Outcome Measure Help from another person eating meals?: None Help from another person taking care of personal grooming?: A Little Help from another person toileting, which includes using toliet, bedpan, or urinal?: A Little Help from another person bathing (including washing, rinsing, drying)?: A Little Help from another person to put on and taking off regular upper body clothing?: A Little Help from another person to put on and taking off regular lower body clothing?: A Little 6 Click Score: 19   End of Session Equipment Utilized During Treatment: Gait belt;Rolling walker (2 wheels) Nurse Communication: Mobility status  Activity Tolerance: Patient tolerated treatment well Patient left: in chair;with call bell/phone within reach;with chair alarm set  OT Visit Diagnosis: Unsteadiness on feet (R26.81);Pain Pain -  Right/Left: Right Pain - part of body: Ankle and joints of foot                Time: 1610-9604 OT Time Calculation (min): 29 min Charges:  OT General Charges $OT Visit: 1 Visit OT Evaluation $OT Eval Low Complexity: 1 Low  Dontavius Keim, OT Acute Rehab Services Office: [phone] 02/16/2023   Theodoro Clock 02/16/2023, 11:28 AM

## 2023-02-16 NOTE — Progress Notes (Signed)
Subjective: 1 Day Post-Op Procedure(s) (LRB): Right ankle trimalleolar open reduction internal fixation, possible syndesmosis and/or deltoid fixation, possible allograft, possible external fixation (Right)  Patient reports pain as appropriately controlled. Denies any new numbness/tingling.   Objective:   VITALS:  Temp:  [97.1 F (36.2 C)-98.7 F (37.1 C)] 98.2 F (36.8 C) (04/23 1006) Pulse Rate:  [63-89] 84 (04/23 1006) Resp:  [13-21] 18 (04/23 1006) BP: (101-137)/(73-96) 116/81 (04/23 1006) SpO2:  [90 %-100 %] 92 % (04/23 1006)  Gen: AAOx3, NAD  Right lower extremity: Well padded short leg splint in place Wiggles toes SILT over toes CR<2s    LABS Recent Labs    02/16/23 0419  HGB 11.4*  WBC 7.1  PLT 250   Recent Labs    02/16/23 0419  NA 134*  K 4.5  CL 101  CO2 24  BUN 15  CREATININE 0.94  GLUCOSE 108*   No results for input(s): "LABPT", "INR" in the last 72 hours.   Assessment/Plan: 1 Day Post-Op Procedure(s) (LRB): Right ankle trimalleolar open reduction internal fixation, possible syndesmosis and/or deltoid fixation, possible allograft, possible external fixation (Right)  -did well overnight, admitted for observation overnight with planned discharge home POD1 -strict NWB operative extremity, maximum elevation -maintain short leg splint until follow up -DVT ppx: Resume Eliquis 48-72 hr postop -follow up as outpatient within 7-10 days for wound check with exchange of short leg splint to short leg cast -sutures out in 3 weeks in outpatient office  Netta Cedars 02/16/2023, 12:48 PM

## 2023-02-17 DIAGNOSIS — S82851A Displaced trimalleolar fracture of right lower leg, initial encounter for closed fracture: Secondary | ICD-10-CM | POA: Diagnosis not present

## 2023-02-17 LAB — CBC
HCT: 36.1 % — ABNORMAL LOW (ref 39.0–52.0)
Hemoglobin: 11.4 g/dL — ABNORMAL LOW (ref 13.0–17.0)
MCH: 27.5 pg (ref 26.0–34.0)
MCHC: 31.6 g/dL (ref 30.0–36.0)
MCV: 87 fL (ref 80.0–100.0)
Platelets: 270 10*3/uL (ref 150–400)
RBC: 4.15 MIL/uL — ABNORMAL LOW (ref 4.22–5.81)
RDW: 18 % — ABNORMAL HIGH (ref 11.5–15.5)
WBC: 6.2 10*3/uL (ref 4.0–10.5)
nRBC: 0 % (ref 0.0–0.2)

## 2023-02-17 LAB — GLUCOSE, CAPILLARY
Glucose-Capillary: 114 mg/dL — ABNORMAL HIGH (ref 70–99)
Glucose-Capillary: 128 mg/dL — ABNORMAL HIGH (ref 70–99)
Glucose-Capillary: 106 mg/dL — ABNORMAL HIGH (ref 70–99)

## 2023-02-17 NOTE — Progress Notes (Signed)
[facility] called to transport pt home 

## 2023-02-17 NOTE — Progress Notes (Signed)
Discharge package printed and instructions given to pt. Pt verbalizes understanding. 

## 2023-02-17 NOTE — Care Management (Signed)
Patient suffers from closed trimalleolar fracture of right ankle and is non-weight bearing, which impairs their ability to perform daily activities like toileting, dressing, and bathing. A rolling walker will not resolve issue with performing activities of daily living. A wheelchair will allow patient to safely perform daily activities. Patient is not able to propel themselves in the home using a standard weight wheelchair due to endurance. Patient can self propel in the lightweight wheelchair.   Patient will need a 3N1 as he is nonweight bearing due to closed trimalleolar fracture of right ankle and will be confined to one room without bathroom access.     Durable Medical Equipment  (From admission, onward)           Start     Ordered   02/16/23 1605  For home use only DME Other see comment  Once       Comments: Lightweight WC with elevating leg rests  Question:  Length of Need  Answer:  6 Months   02/16/23 1604   02/16/23 1130  For home use only DME Other see comment  Once       Comments: Wheelchair with elevating leg rests  Question:  Length of Need  Answer:  6 Months   02/16/23 1129   02/16/23 1129  For home use only DME 3 n 1  Once        02/16/23 1128   02/16/23 1127  For home use only DME Walker rolling  Once       Comments: Tall RW  Question Answer Comment  Walker: With 5 Inch Wheels   Patient needs a walker to treat with the following condition Difficulty in walking, not elsewhere classified      02/16/23 1127   02/15/23 1652  DME Walker rolling  Once       Question Answer Comment  Walker: With 5 Inch Wheels   Patient needs a walker to treat with the following condition Fracture      02/15/23 1651   02/15/23 1652  DME Bedside commode  Once       Question:  Patient needs a bedside commode to treat with the following condition  Answer:  Fracture   02/15/23 1651   02/15/23 1652  DME 3 n 1  Once        02/15/23 1651

## 2023-02-17 NOTE — TOC Transition Note (Signed)
Transition of Care [facility]) - CM/SW Discharge Note  Patient Details  Name: Brent Byrd. MRN: [id number] Date of Birth: 1956-06-18  Transition [facility] Care [facility]) CM/SW Contact:  Ewing Schlein, LCSW Phone Number: 02/17/2023, 2:31 PM  Clinical Narrative: [employer] will now provide the 3N1 and lightweight wheelchair, which will be delivered to the patient's apartment. Medical necessity form done. Discharge packet completed. RN updated and will call PTAR after ortho sees patient. TOC signing off.  Final next level of care: Home/Self Care Barriers to Discharge: Barriers Resolved  Patient Goals and CMS Choice CMS Medicare.gov Compare Post Acute Care list provided to:: Patient Choice offered to / list presented to : Patient  Discharge Plan and Services Additional resources added to the After Visit Summary for   In-house Referral: Clinical Social Work Post Acute Care Choice: Durable Medical Equipment          DME Arranged: Community education officer wheelchair with seat cushion, 3-N-1 DME Agency: [employer] Date DME Agency Contacted: 02/17/23 Representative spoke with at DME Agency: Vaughan Basta  Social Determinants of Health (SDOH) Interventions SDOH Screenings   Food Insecurity: No Food Insecurity (02/15/2023)  Housing: Low Risk  (02/15/2023)  Transportation Needs: No Transportation Needs (02/15/2023)  Utilities: Not At Risk (02/15/2023)  Tobacco Use: High Risk (02/15/2023)   Readmission Risk Interventions     No data to display

## 2023-02-17 NOTE — Progress Notes (Signed)
Physical Therapy Treatment Patient Details Name: Brent Byrd. MRN: [id number] DOB: 30-Nov-1955 Today's Date: 02/17/2023   History of Present Illness 67 year old male patient presents with right trimalleolar ankle fracture sustained on 02/06/23 when he fell down the stairs. He was discharged for outpatient follow up and scheduled for surgery once swelling permitted. Pt returned on 02/15/23 and underwent Right ankle trimalleolar open reduction internal fixation without fixation of posterior malleolus, and Right ankle syndesmosis open reduction internal fixation under Netta Cedars, MD.   His PMH is notable for EtOH abuse, COPD, smoker, DVT, depression, GERD, HTN, remote MI s/p cath, Pre-diabetes, Prior CVA.    PT Comments    Pt reports feeling better today, states he is going home by non-emergency amb and  w/c to be delivered to home. Pt is able to amb ~ 30' with RW and supervision to min/guard for safety, no incr pain with mobility  Recommendations for follow up therapy are one component of a multi-disciplinary discharge planning process, led by the attending physician.  Recommendations may be updated based on patient status, additional functional criteria and insurance authorization.  Follow Up Recommendations       Assistance Recommended at Discharge Intermittent Supervision/Assistance  Patient can return home with the following A little help with bathing/dressing/bathroom;Assistance with cooking/housework;Assist for transportation;Help with stairs or ramp for entrance   Equipment Recommendations  BSC/3in1;Wheelchair (measurements PT);Wheelchair cushion (measurements PT)    Recommendations for Other Services       Precautions / Restrictions Precautions Precautions: Fall Restrictions RLE Weight Bearing: Non weight bearing     Mobility  Bed Mobility Overal bed mobility: Modified Independent             General bed mobility comments: reviewed use of gait belt if needed  for pain control when lifting/lowering RLE    Transfers   Equipment used: Rolling walker (2 wheels) Transfers: Sit to/from Stand Sit to Stand: Min guard, Supervision           General transfer comment: verbal cues for proper hand placement    Ambulation/Gait Ambulation/Gait assistance: Min guard Gait Distance (Feet): 30 Feet Assistive device: Rolling walker (2 wheels)         General Gait Details: distance ltd by pain L knee; good stability with RW   Stairs             Wheelchair Mobility    Modified Rankin (Stroke Patients Only)       Balance     Sitting balance-Leahy Scale: Good     Standing balance support: Reliant on assistive device for balance, During functional activity Standing balance-Leahy Scale: Poor Standing balance comment: NWB RLE                            Cognition Arousal/Alertness: Awake/alert Behavior During Therapy: WFL for tasks assessed/performed Overall Cognitive Status: Within Functional Limits for tasks assessed                                          Exercises      General Comments        Pertinent Vitals/Pain Pain Assessment Pain Assessment: 0-10 Pain Score: 4  Pain Location: RT ankle Pain Descriptors / Indicators: Aching Pain Intervention(s): Limited activity within patient's tolerance, Monitored during session, Repositioned, Other (comment) (elevated)    Home Living  Prior Function            PT Goals (current goals can now be found in the care plan section) Acute Rehab PT Goals Patient Stated Goal: decrease pain PT Goal Formulation: With patient Time For Goal Achievement: 02/23/23 Potential to Achieve Goals: Good Progress towards PT goals: Progressing toward goals    Frequency    Min 1X/week      PT Plan Current plan remains appropriate    Co-evaluation              AM-PAC PT "6 Clicks" Mobility   Outcome Measure   Help needed turning from your back to your side while in a flat bed without using bedrails?: None Help needed moving from lying on your back to sitting on the side of a flat bed without using bedrails?: None Help needed moving to and from a bed to a chair (including a wheelchair)?: A Little Help needed standing up from a chair using your arms (e.g., wheelchair or bedside chair)?: A Little Help needed to walk in hospital room?: A Little Help needed climbing 3-5 steps with a railing? : A Lot 6 Click Score: 19    End of Session Equipment Utilized During Treatment: Gait belt Activity Tolerance: Patient tolerated treatment well Patient left: in bed;with call bell/phone within reach;with bed alarm set   PT Visit Diagnosis: Difficulty in walking, not elsewhere classified (R26.2);Unsteadiness on feet (R26.81);Pain Pain - Right/Left: Right Pain - part of body: Ankle and joints of foot     Time: 6962-9528 PT Time Calculation (min) (ACUTE ONLY): 13 min  Charges:  $Gait Training: 8-22 mins                     Delice Bison, PT  Acute Rehab Dept ([facility]) [phone]  02/17/2023    [facility] 02/17/2023, 2:00 PM

## 2023-02-18 ENCOUNTER — Encounter (HOSPITAL_COMMUNITY): Payer: Self-pay | Admitting: Orthopaedic Surgery

## 2023-02-19 NOTE — Discharge Summary (Signed)
Physician Discharge Summary  Patient ID: Brent Byrd. MRN: [id number] DOB/AGE: 1956/06/29 67 y.o.  Admit date: 02/15/2023 Discharge date: 02/17/2023  Admission Diagnoses: Closed trimalleolar fracture of right ankle   Discharge Diagnoses:  Principal Problem:   Postoperative state   Discharged Condition: good  Hospital Course:  The patient presents with right trimalleolar ankle fracture sustained on 02/06/23 when he fell down the stairs. He denies pain elsewhere. The patient was seen in the ER and splinted. He was discharged for outpatient follow up and scheduled for surgery once swelling permitted. His PMH is notable for EtOH abuse, COPD, smoker, DVT, depression, GERD, HTN, remote MI s/p cath, Pre-diabetes, Prior CVA. Denies numbness/tingling.   We discussed the diagnosis, alternative treatment options, risks and benefits of the above surgical intervention, as well as alternative non-operative treatments. All questions/concerns were addressed and the patient/family demonstrated appropriate understanding of the diagnosis, the procedure, the postoperative course, and overall prognosis. The patient wished to proceed with surgical intervention and signed an informed surgical consent as such, in each others presence prior to surgery.   On 02/15/23, the patient underwent Right ankle trimalleolar open reduction internal fixation without fixation of posterior malleolus  as well as right ankle syndesmosis fixation. He tolerated this well without complications. He was admitted for observation POD1. However, in order to secure DME, his postop stay was extended to POD2. He did well with PT and OT postop and discharged home POD2.    Consults: None  Significant Diagnostic Studies: n/a  Treatments: Surgery as above  Discharge Exam: Blood pressure 106/71, pulse 70, temperature 98.4 F (36.9 C), temperature source Oral, resp. rate 18, height 6' (1.829 m), weight 121.6 kg, SpO2 93 %. Gen: AAOx3,  NAD  Right lower extremity: Well padded short leg splint in place Wiggles toes SILT over toes CR<2s    Disposition: Discharge disposition: 01-Home or Self Care        Allergies as of 02/17/2023       Reactions   Iodine Hives, Other (See Comments)   Omnipaque [iohexol] Other (See Comments)   IV Dye-Sneezing        Medication List     TAKE these medications    acetaminophen 500 MG tablet Commonly known as: TYLENOL Take 1 tablet (500 mg total) by mouth every 6 (six) hours as needed.   albuterol 108 (90 Base) MCG/ACT inhaler Commonly known as: VENTOLIN HFA Inhale 1-2 puffs into the lungs every 6 (six) hours as needed for wheezing or shortness of breath.   albuterol (2.5 MG/3ML) 0.083% nebulizer solution Commonly known as: PROVENTIL Take 2.5 mg by nebulization every 6 (six) hours as needed for wheezing or shortness of breath.   amLODipine 5 MG tablet Commonly known as: NORVASC Take 5 mg by mouth daily.   aspirin EC 81 MG tablet Take 1 tablet (81 mg total) by mouth daily. Swallow whole.   Breztri Aerosphere 160-9-4.8 MCG/ACT Aero Generic drug: Budeson-Glycopyrrol-Formoterol Inhale 2 puffs into the lungs 2 (two) times daily.   cetirizine 10 MG tablet Commonly known as: ZYRTEC Take 10 mg by mouth daily as needed for allergies.   Eliquis 5 MG Tabs tablet Generic drug: apixaban Take 5 mg by mouth 2 (two) times daily.   fluticasone 50 MCG/ACT nasal spray Commonly known as: FLONASE Place 2 sprays into both nostrils daily as needed for allergies.   hydrochlorothiazide 12.5 MG capsule Commonly known as: MICROZIDE TAKE 1 CAPSULE BY MOUTH EVERY DAY   HYDROcodone-acetaminophen 5-325 MG tablet Commonly known  as: NORCO/VICODIN Take 1 tablet by mouth every 4 (four) hours as needed for moderate pain.   Jardiance 10 MG Tabs tablet Generic drug: empagliflozin Take 10 mg by mouth daily.   metFORMIN 500 MG tablet Commonly known as: GLUCOPHAGE Take 500 mg by  mouth 2 (two) times daily with a meal.   nitroGLYCERIN 0.4 MG SL tablet Commonly known as: NITROSTAT PLACE 1 TABLET (0.4 MG TOTAL) UNDER THE TONGUE EVERY 5 (FIVE) MINUTES AS NEEDED FOR CHEST PAIN. IF YOU REQUIRE MORE THAN TWO TABLETS FIVE MINUTES APART GO TO THE NEAREST ER VIA EMS.   omeprazole 20 MG capsule Commonly known as: PRILOSEC Take 20 mg by mouth daily.   rosuvastatin 20 MG tablet Commonly known as: CRESTOR TAKE 1 TABLET BY MOUTH EVERYDAY AT BEDTIME   Vitamin D (Ergocalciferol) 1.25 MG (50000 UNIT) Caps capsule Commonly known as: DRISDOL Take 50,000 Units by mouth once a week.   Voltaren Arthritis Pain 1 % Gel Generic drug: diclofenac Sodium Apply 2 g topically 4 (four) times daily as needed (pain).         Signed: Netta Cedars 02/19/2023, 4:43 AM

## 2023-03-05 ENCOUNTER — Other Ambulatory Visit: Payer: Self-pay

## 2023-03-05 DIAGNOSIS — E119 Type 2 diabetes mellitus without complications: Secondary | ICD-10-CM

## 2023-03-05 DIAGNOSIS — R931 Abnormal findings on diagnostic imaging of heart and coronary circulation: Secondary | ICD-10-CM

## 2023-03-05 DIAGNOSIS — R0602 Shortness of breath: Secondary | ICD-10-CM

## 2023-03-05 DIAGNOSIS — I10 Essential (primary) hypertension: Secondary | ICD-10-CM

## 2023-03-05 MED ORDER — ELIQUIS 5 MG PO TABS: 5.0000 mg | ORAL_TABLET | Freq: Two times a day (BID) | ORAL | 0 refills | Status: AC

## 2023-03-05 MED ORDER — ASPIRIN 81 MG PO TBEC
81.0000 mg | DELAYED_RELEASE_TABLET | Freq: Every day | ORAL | 0 refills | Status: AC
Start: 2023-03-05 — End: ?

## 2023-03-05 MED ORDER — AMLODIPINE BESYLATE 5 MG PO TABS: 5.0000 mg | ORAL_TABLET | Freq: Every day | ORAL | 0 refills | Status: AC

## 2023-03-05 MED ORDER — NITROGLYCERIN 0.4 MG SL SUBL
0.4000 mg | SUBLINGUAL_TABLET | SUBLINGUAL | 3 refills | Status: AC | PRN
Start: 2023-03-05 — End: 2023-04-04

## 2023-03-05 MED ORDER — ROSUVASTATIN CALCIUM 20 MG PO TABS
ORAL_TABLET | ORAL | 0 refills | Status: DC
Start: 2023-03-05 — End: 2023-11-08

## 2023-03-05 MED ORDER — HYDROCHLOROTHIAZIDE 12.5 MG PO CAPS
12.5000 mg | ORAL_CAPSULE | Freq: Every day | ORAL | 0 refills | Status: DC
Start: 2023-03-05 — End: 2023-11-08

## 2023-03-16 ENCOUNTER — Ambulatory Visit: Payer: Medicaid Other | Admitting: Gastroenterology

## 2023-03-24 ENCOUNTER — Ambulatory Visit: Payer: Medicaid Other | Admitting: Gastroenterology

## 2023-05-24 ENCOUNTER — Ambulatory Visit: Payer: MEDICAID | Attending: Urology

## 2023-05-24 NOTE — Therapy (Deleted)
OUTPATIENT PHYSICAL THERAPY MALE PELVIC EVALUATION   Patient Name: Brent Byrd. MRN: [id number] DOB:09/23/1956, 67 y.o., male Today's Date: 05/24/2023  END OF SESSION:   Past Medical History:  Diagnosis Date   Alcohol abuse    history of   Arthritis    Left knee, lower back   Chest pain    hx   COPD (chronic obstructive pulmonary disease) (HCC)    "[place] said I don't have this" (10/26/2016)   Depression    no meds   Diabetes mellitus without complication (HCC)    DVT (deep venous thrombosis) (HCC) 2008   Right leg- txed with coumadin    DVT (deep venous thrombosis) (HCC) 2010; 2017   RLE; RLE   Gastroesophageal reflux disease 2014   Hypertension    MI, old ~ 2007   Pneumonia    Pre-diabetes    Stroke (HCC) 01/2012   denies residual on 10/26/2016   Substance abuse (HCC)    Alcohol dependence Hx   Tobacco abuse    Past Surgical History:  Procedure Laterality Date   GANGLION CYST EXCISION Left 2000   wrist   HAMMER TOE SURGERY Left 07/2012   2nd toe/08/05/2012   LEFT HEART CATH AND CORONARY ANGIOGRAPHY N/A 05/19/2022   Procedure: LEFT HEART CATH AND CORONARY ANGIOGRAPHY;  Surgeon: Yates Decamp, MD;  Location: [facility];  Service: Cardiovascular;  Laterality: N/A;   LUMBAR DISC SURGERY  06/2007   Herniated disc/notes 02/25/2011   ORIF ANKLE FRACTURE Right 02/15/2023   Procedure: Right ankle trimalleolar open reduction internal fixation, possible syndesmosis and/or deltoid fixation, possible allograft, possible external fixation;  Surgeon: Netta Cedars, MD;  Location: [facility];  Service: Orthopedics;  Laterality: Right;   TRANSURETHRAL RESECTION OF PROSTATE N/A 10/13/2022   Procedure: TRANSURETHRAL RESECTION OF THE PROSTATE (TURP);  Surgeon: Noel Christmas, MD;  Location: [facility];  Service: Urology;  Laterality: N/A;  90 MINS   WISDOM TOOTH EXTRACTION     Patient Active Problem List   Diagnosis Date Noted   Postoperative state 02/15/2023   BPH  with obstruction/lower urinary tract symptoms 10/13/2022   Abnormal nuclear stress test    Headache syndrome 07/24/2020   PNA (pneumonia) 01/04/2020   COVID-19 01/04/2020   Community acquired pneumonia 10/25/2016   Acute respiratory failure with hypoxia (HCC) 10/25/2016   Rhinitis, non-allergic 10/25/2016   Dyspnea on exertion 04/13/2016   Hypotension 04/13/2016   Acute respiratory distress 04/13/2016   Substance abuse (HCC)    Chest pain    COPD (chronic obstructive pulmonary disease) (HCC)    Gastroesophageal reflux disease    Hypertension    Depression    Tobacco abuse    Alcohol abuse    Cigarette smoker 05/18/2015   Asthmatic bronchitis with acute exacerbation 05/17/2015    PCP: Fleet Contras, MD  REFERRING PROVIDER: Noel Christmas, MD  REFERRING DIAG: urinary frequency, urinary urgency chronic/stable eacerbatic  THERAPY DIAG:  No diagnosis found.  Rationale for Evaluation and Treatment: Rehabilitation  ONSET DATE: ***  SUBJECTIVE:  SUBJECTIVE STATEMENT: *** Fluid intake: {Yes/No:304960894}   PAIN:  Are you having pain? {yes/no:20286} NPRS scale: ***/10 Pain location: {pelvic pain location:27098}  Pain type: {type:313116} Pain description: {PAIN DESCRIPTION:21022940}   Aggravating factors: *** Relieving factors: ***  PRECAUTIONS: None  RED FLAGS: None   WEIGHT BEARING RESTRICTIONS: No  FALLS:  Has patient fallen in last 6 months? No  LIVING ENVIRONMENT: Lives with: {[facility] lives with:25569::"lives with their family"} Lives in: {Lives in:25570}   OCCUPATION: ***  PLOF: Independent  PATIENT GOALS: ***  PERTINENT HISTORY:  *** Sexual abuse: {Yes/No:304960894}  BOWEL MOVEMENT: Pain with bowel movement: {yes/no:20286} Type of bowel movement:{PT BM  type:27100} Fully empty rectum: {Yes/No:304960894} Leakage: {Yes/No:304960894} Pads: {Yes/No:304960894} Fiber supplement: {Yes/No:304960894}  URINATION: Pain with urination: {yes/no:20286} Fully empty bladder: {Yes/No:304960894} Stream: {PT urination:27102} Urgency: {Yes/No:304960894} Frequency: *** Leakage: {PT leakage:27103} Pads: {Yes/No:304960894}  INTERCOURSE: Pain with intercourse: {pain with intercourse PA:27099} Ability to have vaginal penetration:  {Yes/No:304960894} Climax: *** Marinoff Scale: ***/3  PREGNANCY: Vaginal deliveries *** Tearing {Yes***/No:304960894} C-section deliveries *** Currently pregnant {Yes***/No:304960894}  PROLAPSE: {PT prolapse:27101}   OBJECTIVE:   DIAGNOSTIC FINDINGS:  ***  PATIENT SURVEYS:  {rehab surveys:24030}  PFIQ-7 ***  COGNITION: Overall cognitive status: {cognition:24006}     SENSATION: Light touch: {intact/deficits:24005} Proprioception: {intact/deficits:24005}  MUSCLE LENGTH: Hamstrings: Right *** deg; Left *** deg Thomas test: Right *** deg; Left *** deg  LUMBAR SPECIAL TESTS:  {lumbar special test:25242}  FUNCTIONAL TESTS:  {Functional tests:24029}  GAIT: Distance walked: *** Assistive device utilized: {Assistive devices:23999} Level of assistance: {Levels of assistance:24026} Comments: ***  POSTURE: {posture:25561}  PELVIC ALIGNMENT:  LUMBARAROM/PROM:  A/PROM A/PROM  eval  Flexion   Extension   Right lateral flexion   Left lateral flexion   Right rotation   Left rotation    (Blank rows = not tested)  LOWER EXTREMITY ROM:  {AROM/PROM:27142} ROM Right eval Left eval  Hip flexion    Hip extension    Hip abduction    Hip adduction    Hip internal rotation    Hip external rotation    Knee flexion    Knee extension    Ankle dorsiflexion    Ankle plantarflexion    Ankle inversion    Ankle eversion     (Blank rows = not tested)  LOWER EXTREMITY MMT:  MMT Right eval Left eval   Hip flexion    Hip extension    Hip abduction    Hip adduction    Hip internal rotation    Hip external rotation    Knee flexion    Knee extension    Ankle dorsiflexion    Ankle plantarflexion    Ankle inversion    Ankle eversion     PALPATION:   General  ***                External Perineal Exam ***                             Internal Pelvic Floor ***  Patient confirms identification and approves PT to assess internal pelvic floor and treatment {yes/no:20286}  PELVIC MMT:   MMT eval  Vaginal   Internal Anal Sphincter   External Anal Sphincter   Puborectalis   Diastasis Recti   (Blank rows = not tested)        TONE: ***  PROLAPSE: ***  TODAY'S TREATMENT:  DATE:  05/24/23  EVAL  Manual:  Neuromuscular re-education:  Exercises:  Therapeutic activities:     PATIENT EDUCATION:  Education details: See above Person educated: Patient Education method: Explanation, Demonstration, Tactile cues, Verbal cues, and Handouts Education comprehension: verbalized understanding  HOME EXERCISE PROGRAM: ***  ASSESSMENT:  CLINICAL IMPRESSION: Patient is a 67 y.o. male who was seen today for physical therapy evaluation and treatment for ***.   OBJECTIVE IMPAIRMENTS: decreased activity tolerance, decreased coordination, decreased endurance, decreased strength, increased fascial restrictions, increased muscle spasms, impaired tone, postural dysfunction, and pain.   ACTIVITY LIMITATIONS: {activitylimitations:27494}  PARTICIPATION LIMITATIONS: {participationrestrictions:25113}  PERSONAL FACTORS: {Personal factors:25162} are also affecting patient's functional outcome.   REHAB POTENTIAL: Good  CLINICAL DECISION MAKING: Stable/uncomplicated  EVALUATION COMPLEXITY: Low   GOALS: Goals reviewed with patient? Yes  SHORT TERM GOALS: Target  date: 06/21/23  Pt will be independent with HEP.   Baseline: Goal status: INITIAL  2.  *** Baseline:  Goal status: INITIAL  3.  *** Baseline:  Goal status: INITIAL  4.  *** Baseline:  Goal status: INITIAL  5.  *** Baseline:  Goal status: INITIAL  6.  *** Baseline:  Goal status: INITIAL  LONG TERM GOALS: Target date:   Pt will be independent with advanced HEP.   Baseline:  Goal status: INITIAL  2.  *** Baseline:  Goal status: INITIAL  3.  *** Baseline:  Goal status: INITIAL  4.  *** Baseline:  Goal status: INITIAL  5.  *** Baseline:  Goal status: INITIAL  6.  *** Baseline:  Goal status: INITIAL  PLAN:  PT FREQUENCY: 1-2x/week  PT DURATION: 6 months  PLANNED INTERVENTIONS: Therapeutic exercises, Therapeutic activity, Neuromuscular re-education, Balance training, Gait training, Patient/Family education, Self Care, Joint mobilization, Dry Needling, Biofeedback, and Manual therapy  PLAN FOR NEXT SESSION: ***   Julio Alm, PT, DPT07/29/247:48 AM

## 2023-06-03 ENCOUNTER — Ambulatory Visit: Payer: MEDICAID | Admitting: Cardiology

## 2023-06-03 ENCOUNTER — Ambulatory Visit: Payer: MEDICAID | Admitting: Nurse Practitioner

## 2023-06-03 ENCOUNTER — Telehealth: Payer: Self-pay | Admitting: Nurse Practitioner

## 2023-06-03 NOTE — Telephone Encounter (Signed)
Good Morning Brent Byrd,   Patient called stating that he would not be able to make his 11:30 appointment with you this morning due to the weather.   Patient was rescheduled for 10/24 at 9:30 with Doug Sou

## 2023-06-17 ENCOUNTER — Ambulatory Visit: Payer: MEDICAID | Admitting: Cardiology

## 2023-06-18 ENCOUNTER — Ambulatory Visit: Payer: MEDICAID | Admitting: Cardiology

## 2023-07-13 ENCOUNTER — Ambulatory Visit: Payer: MEDICAID | Admitting: Cardiology

## 2023-08-16 ENCOUNTER — Encounter (HOSPITAL_COMMUNITY): Payer: Self-pay

## 2023-08-16 ENCOUNTER — Emergency Department (HOSPITAL_COMMUNITY)
Admission: EM | Admit: 2023-08-16 | Discharge: 2023-08-16 | Disposition: A | Discharge status: Home / Self Care | Payer: MEDICAID | Attending: Student | Admitting: Student

## 2023-08-16 ENCOUNTER — Other Ambulatory Visit: Payer: Self-pay

## 2023-08-16 DIAGNOSIS — Z7951 Long term (current) use of inhaled steroids: Secondary | ICD-10-CM | POA: Diagnosis not present

## 2023-08-16 DIAGNOSIS — Z79899 Other long term (current) drug therapy: Secondary | ICD-10-CM | POA: Diagnosis not present

## 2023-08-16 DIAGNOSIS — Z7984 Long term (current) use of oral hypoglycemic drugs: Secondary | ICD-10-CM | POA: Insufficient documentation

## 2023-08-16 DIAGNOSIS — F17210 Nicotine dependence, cigarettes, uncomplicated: Secondary | ICD-10-CM | POA: Insufficient documentation

## 2023-08-16 DIAGNOSIS — R350 Frequency of micturition: Secondary | ICD-10-CM | POA: Diagnosis present

## 2023-08-16 DIAGNOSIS — Z7901 Long term (current) use of anticoagulants: Secondary | ICD-10-CM | POA: Diagnosis not present

## 2023-08-16 DIAGNOSIS — N3001 Acute cystitis with hematuria: Secondary | ICD-10-CM | POA: Insufficient documentation

## 2023-08-16 DIAGNOSIS — Z7982 Long term (current) use of aspirin: Secondary | ICD-10-CM | POA: Diagnosis not present

## 2023-08-16 DIAGNOSIS — J449 Chronic obstructive pulmonary disease, unspecified: Secondary | ICD-10-CM | POA: Insufficient documentation

## 2023-08-16 DIAGNOSIS — I10 Essential (primary) hypertension: Secondary | ICD-10-CM | POA: Insufficient documentation

## 2023-08-16 DIAGNOSIS — Z8673 Personal history of transient ischemic attack (TIA), and cerebral infarction without residual deficits: Secondary | ICD-10-CM | POA: Diagnosis not present

## 2023-08-16 DIAGNOSIS — E119 Type 2 diabetes mellitus without complications: Secondary | ICD-10-CM | POA: Insufficient documentation

## 2023-08-16 LAB — URINALYSIS, ROUTINE W REFLEX MICROSCOPIC
Bilirubin Urine: NEGATIVE
Glucose, UA: NEGATIVE mg/dL
Ketones, ur: NEGATIVE mg/dL
Nitrite: POSITIVE — AB
Protein, ur: 30 mg/dL — AB
Specific Gravity, Urine: 1.018 (ref 1.005–1.030)
WBC, UA: >50 WBC/hpf (ref 0–5)
pH: 5 (ref 5.0–8.0)

## 2023-08-16 LAB — COMPREHENSIVE METABOLIC PANEL
ALT: 20 U/L (ref 0–44)
AST: 19 U/L (ref 15–41)
Albumin: 3.6 g/dL (ref 3.5–5.0)
Alkaline Phosphatase: 72 U/L (ref 38–126)
Anion gap: 10 (ref 5–15)
BUN: 9 mg/dL (ref 8–23)
CO2: 24 mmol/L (ref 22–32)
Calcium: 9.1 mg/dL (ref 8.9–10.3)
Chloride: 105 mmol/L (ref 98–111)
Creatinine, Ser: 0.85 mg/dL (ref 0.61–1.24)
GFR, Estimated: >60 mL/min (ref >60)
Glucose, Bld: 92 mg/dL (ref 70–99)
Potassium: 3.8 mmol/L (ref 3.5–5.1)
Sodium: 139 mmol/L (ref 135–145)
Total Bilirubin: 0.7 mg/dL (ref 0.3–1.2)
Total Protein: 7.9 g/dL (ref 6.5–8.1)

## 2023-08-16 LAB — CBC WITH DIFFERENTIAL/PLATELET
Abs Immature Granulocytes: 0.01 10*3/uL (ref 0.00–0.07)
Basophils Absolute: 0 10*3/uL (ref 0.0–0.1)
Basophils Relative: 1 %
Eosinophils Absolute: 0.2 10*3/uL (ref 0.0–0.5)
Eosinophils Relative: 4 %
HCT: 41.2 % (ref 39.0–52.0)
Hemoglobin: 13.3 g/dL (ref 13.0–17.0)
Immature Granulocytes: 0 %
Lymphocytes Relative: 28 %
Lymphs Abs: 1.3 10*3/uL (ref 0.7–4.0)
MCH: 28.2 pg (ref 26.0–34.0)
MCHC: 32.3 g/dL (ref 30.0–36.0)
MCV: 87.5 fL (ref 80.0–100.0)
Monocytes Absolute: 0.8 10*3/uL (ref 0.1–1.0)
Monocytes Relative: 17 %
Neutro Abs: 2.3 10*3/uL (ref 1.7–7.7)
Neutrophils Relative %: 50 %
Platelets: 209 10*3/uL (ref 150–400)
RBC: 4.71 MIL/uL (ref 4.22–5.81)
RDW: 17.6 % — ABNORMAL HIGH (ref 11.5–15.5)
WBC: 4.6 10*3/uL (ref 4.0–10.5)
nRBC: 0 % (ref 0.0–0.2)

## 2023-08-16 MED ORDER — CEFADROXIL 500 MG PO CAPS
500.0000 mg | ORAL_CAPSULE | Freq: Two times a day (BID) | ORAL | Status: DC
  Administered 2023-08-16: 500 mg via ORAL
  Filled 2023-08-16: qty 1

## 2023-08-16 MED ORDER — NAPROXEN 500 MG PO TABS
500.0000 mg | ORAL_TABLET | Freq: Once | ORAL | Status: AC
  Administered 2023-08-16: 500 mg via ORAL
  Filled 2023-08-16: qty 1

## 2023-08-16 MED ORDER — CEFADROXIL 500 MG PO CAPS: 500.0000 mg | ORAL_CAPSULE | Freq: Two times a day (BID) | ORAL | 0 refills | Status: AC

## 2023-08-16 NOTE — ED Triage Notes (Signed)
Pt reports urinary retention/frequency  x1 week. States last time he was able to fully void was 3 days ago. Endorses prostate issues/procedure a year ago. Denies hematuria. Reports mild abdominal pain.

## 2023-08-16 NOTE — ED Provider Triage Note (Signed)
Emergency Medicine Provider Triage Evaluation Note  Brent Byrd. , a 67 y.o. male  was evaluated in triage.  Pt complains of incomplete voiding and occasional painful urination that started sometime last week.  He complains of voiding up to 7 times at night.  History of BPH but has had a TURP procedure in 09/2022  Review of Systems  Positive: Dysuria, incomplete voiding Negative: Fever, burning urination, hematuria, penile discharge  Physical Exam  BP 121/77 (BP Location: Left Arm)   Pulse 82   Temp 97.9 F (36.6 C) (Oral)   Resp (!) 24   Ht 6' (1.829 m)   Wt 121 kg   SpO2 92%   BMI 36.18 kg/m  Gen:   Awake, no distress   Resp:  Normal effort  MSK:   Moves extremities without difficulty  Other:    Medical Decision Making  Medically screening exam initiated at 1:03 PM.  Appropriate orders placed.  Brent Byrd. was informed that the remainder of the evaluation will be completed by another provider, this initial triage assessment does not replace that evaluation, and the importance of remaining in the ED until their evaluation is complete.  Labs ordered   Brent Byrd, [place] 08/16/23 1304

## 2023-08-16 NOTE — ED Provider Notes (Signed)
[place] EMERGENCY DEPARTMENT AT [facility] Provider Note  CSN: [id number] Arrival date & time: 08/16/23 1041  Chief Complaint(s) Urinary Frequency  HPI Brent Byrd. is a 67 y.o. male with PMH previous CVA, CAD status post MI, GERD, DVT on Coumadin, prostate cancer status post TURP in December 2023 who presents emergency room for evaluation of urinary frequency and lower abdominal pain.  States that symptoms have been worsening over the last 1 week.  States he feels like he is unable to fully void.  Denies associated nausea, vomiting, chest pain, shortness of breath, headache, fever or other systemic symptoms.   Past Medical History Past Medical History:  Diagnosis Date   Alcohol abuse    history of   Arthritis    Left knee, lower back   Chest pain    hx   COPD (chronic obstructive pulmonary disease) (HCC)    "[place] said I don't have this" (10/26/2016)   Depression    no meds   Diabetes mellitus without complication (HCC)    DVT (deep venous thrombosis) (HCC) 2008   Right leg- txed with coumadin    DVT (deep venous thrombosis) (HCC) 2010; 2017   RLE; RLE   Gastroesophageal reflux disease 2014   Hypertension    MI, old ~ 2007   Pneumonia    Pre-diabetes    Stroke (HCC) 01/2012   denies residual on 10/26/2016   Substance abuse (HCC)    Alcohol dependence Hx   Tobacco abuse    Patient Active Problem List   Diagnosis Date Noted   Postoperative state 02/15/2023   BPH with obstruction/lower urinary tract symptoms 10/13/2022   Abnormal nuclear stress test    Headache syndrome 07/24/2020   PNA (pneumonia) 01/04/2020   COVID-19 01/04/2020   Community acquired pneumonia 10/25/2016   Acute respiratory failure with hypoxia (HCC) 10/25/2016   Rhinitis, non-allergic 10/25/2016   Dyspnea on exertion 04/13/2016   Hypotension 04/13/2016   Acute respiratory distress 04/13/2016   Substance abuse (HCC)    Chest pain    COPD (chronic obstructive pulmonary  disease) (HCC)    Gastroesophageal reflux disease    Hypertension    Depression    Tobacco abuse    Alcohol abuse    Cigarette smoker 05/18/2015   Asthmatic bronchitis with acute exacerbation 05/17/2015   Home Medication(s) Prior to Admission medications   Medication Sig Start Date End Date Taking? Authorizing Provider  acetaminophen (TYLENOL) 500 MG tablet Take 1 tablet (500 mg total) by mouth every 6 (six) hours as needed. 02/07/23   Redwine, Josel Keo A, PA-C  albuterol (PROVENTIL) (2.5 MG/3ML) 0.083% nebulizer solution Take 2.5 mg by nebulization every 6 (six) hours as needed for wheezing or shortness of breath. 08/25/22   [provider]  albuterol (VENTOLIN HFA) 108 (90 Base) MCG/ACT inhaler Inhale 1-2 puffs into the lungs every 6 (six) hours as needed for wheezing or shortness of breath.    [provider]  amLODipine (NORVASC) 5 MG tablet Take 1 tablet (5 mg total) by mouth daily. 03/05/23   Custovic, Rozell Searing, DO  aspirin EC 81 MG tablet Take 1 tablet (81 mg total) by mouth daily. Swallow whole. 03/05/23   Custovic, Rozell Searing, DO  Budeson-Glycopyrrol-Formoterol (BREZTRI AEROSPHERE) 160-9-4.8 MCG/ACT AERO Inhale 2 puffs into the lungs 2 (two) times daily.    [provider]  cetirizine (ZYRTEC) 10 MG tablet Take 10 mg by mouth daily as needed for allergies. 11/10/22   [provider]  diclofenac  Sodium (VOLTAREN ARTHRITIS PAIN) 1 % GEL Apply 2 g topically 4 (four) times daily as needed (pain).    [provider]  ELIQUIS 5 MG TABS tablet Take 1 tablet (5 mg total) by mouth 2 (two) times daily. 03/05/23   Custovic, Rozell Searing, DO  fluticasone (FLONASE) 50 MCG/ACT nasal spray Place 2 sprays into both nostrils daily as needed for allergies. 11/10/22   [provider]  hydrochlorothiazide (MICROZIDE) 12.5 MG capsule Take 1 capsule (12.5 mg total) by mouth daily. 03/05/23   Custovic, Rozell Searing, DO  HYDROcodone-acetaminophen (NORCO/VICODIN) 5-325 MG tablet  Take 1 tablet by mouth every 4 (four) hours as needed for moderate pain. 10/14/22 10/14/23  Noel Christmas, MD  JARDIANCE 10 MG TABS tablet Take 10 mg by mouth daily. 10/13/22   [provider]  metFORMIN (GLUCOPHAGE) 500 MG tablet Take 500 mg by mouth 2 (two) times daily with a meal.    [provider]  nitroGLYCERIN (NITROSTAT) 0.4 MG SL tablet Place 1 tablet (0.4 mg total) under the tongue every 5 (five) minutes as needed for chest pain. If you require more than two tablets five minutes apart go to the nearest ER via EMS. 03/05/23 04/04/23  Custovic, Rozell Searing, DO  omeprazole (PRILOSEC) 20 MG capsule Take 20 mg by mouth daily.    [provider]  rosuvastatin (CRESTOR) 20 MG tablet TAKE 1 TABLET BY MOUTH EVERYDAY AT BEDTIME 03/05/23   Custovic, Sabina, DO  Vitamin D, Ergocalciferol, (DRISDOL) 1.25 MG (50000 UNIT) CAPS capsule Take 50,000 Units by mouth once a week. 12/24/22   [provider]  famotidine (PEPCID) 20 MG tablet Take 1 tablet (20 mg total) by mouth 2 (two) times daily for 10 days. 01/08/20 08/22/20  Elgergawy, Leana Roe, MD                                                                                                                                    Past Surgical History Past Surgical History:  Procedure Laterality Date   GANGLION CYST EXCISION Left 2000   wrist   HAMMER TOE SURGERY Left 07/2012   2nd toe/08/05/2012   LEFT HEART CATH AND CORONARY ANGIOGRAPHY N/A 05/19/2022   Procedure: LEFT HEART CATH AND CORONARY ANGIOGRAPHY;  Surgeon: Yates Decamp, MD;  Location: [facility];  Service: Cardiovascular;  Laterality: N/A;   LUMBAR DISC SURGERY  06/2007   Herniated disc/notes 02/25/2011   ORIF ANKLE FRACTURE Right 02/15/2023   Procedure: Right ankle trimalleolar open reduction internal fixation, possible syndesmosis and/or deltoid fixation, possible allograft, possible external fixation;  Surgeon: Netta Cedars, MD;  Location: [facility];   Service: Orthopedics;  Laterality: Right;   TRANSURETHRAL RESECTION OF PROSTATE N/A 10/13/2022   Procedure: TRANSURETHRAL RESECTION OF THE PROSTATE (TURP);  Surgeon: Noel Christmas, MD;  Location: [facility];  Service: Urology;  Laterality: N/A;  90 MINS   WISDOM TOOTH EXTRACTION  Family History Family History  Problem Relation Age of Onset   Asthma Mother    Cancer Father        Lung   Cancer Sister    Thrombosis Sister    Diabetes Sister    Colon cancer Neg Hx    Rectal cancer Neg Hx    Stomach cancer Neg Hx    Esophageal cancer Neg Hx     Social History Social History   Tobacco Use   Smoking status: Every Day    Current packs/day: 1.00    Average packs/day: 1 pack/day for 45.0 years (45.0 ttl pk-yrs)    Types: Cigarettes   Smokeless tobacco: Never   Tobacco comments:    Pt reports a pack lasts a month  Vaping Use   Vaping status: Never Used  Substance Use Topics   Alcohol use: Not Currently    Alcohol/week: 1.0 standard drink of alcohol    Types: 1 Cans of beer per week   Drug use: Not Currently    Types: "Crack" cocaine, Cocaine   Allergies Iodine and Omnipaque [iohexol]  Review of Systems Review of Systems  Gastrointestinal:  Positive for abdominal pain.  Genitourinary:  Positive for dysuria, frequency and urgency.    Physical Exam Vital Signs  I have reviewed the triage vital signs BP 120/84 (BP Location: Left Arm)   Pulse 70   Temp 97.6 F (36.4 C) (Oral)   Resp 20   Ht 6' (1.829 m)   Wt 121 kg   SpO2 95%   BMI 36.18 kg/m   Physical Exam Constitutional:      General: He is not in acute distress.    Appearance: Normal appearance.  HENT:     Head: Normocephalic and atraumatic.     Nose: No congestion or rhinorrhea.  Eyes:     General:        Right eye: No discharge.        Left eye: No discharge.     Extraocular Movements: Extraocular movements intact.     Pupils: Pupils are equal, round, and reactive to light.  Cardiovascular:      Rate and Rhythm: Normal rate and regular rhythm.     Heart sounds: No murmur heard. Pulmonary:     Effort: No respiratory distress.     Breath sounds: No wheezing or rales.  Abdominal:     General: There is no distension.     Tenderness: There is abdominal tenderness.  Musculoskeletal:        General: Normal range of motion.     Cervical back: Normal range of motion.  Skin:    General: Skin is warm and dry.  Neurological:     General: No focal deficit present.     Mental Status: He is alert.     ED Results and Treatments Labs (all labs ordered are listed, but only abnormal results are displayed) Labs Reviewed  CBC WITH DIFFERENTIAL/PLATELET - Abnormal; Notable for the following components:      Result Value   RDW 17.6 (*)    All other components within normal limits  URINALYSIS, ROUTINE W REFLEX MICROSCOPIC - Abnormal; Notable for the following components:   Color, Urine AMBER (*)    APPearance CLOUDY (*)    Hgb urine dipstick SMALL (*)    Protein, ur 30 (*)    Nitrite POSITIVE (*)    Leukocytes,Ua LARGE (*)    Bacteria, UA FEW (*)    All other components within normal  limits  URINE CULTURE  COMPREHENSIVE METABOLIC PANEL                                                                                                                          Radiology No results found.  Pertinent labs & imaging results that were available during my care of the patient were reviewed by me and considered in my medical decision making (see MDM for details).  Medications Ordered in ED Medications  naproxen (NAPROSYN) tablet 500 mg (has no administration in time range)  cefadroxil (DURICEF) capsule 500 mg (has no administration in time range)                                                                                                                                     Procedures Procedures  (including critical care time)  Medical Decision Making / ED Course   This patient  presents to the ED for concern of pelvic pain, this involves an extensive number of treatment options, and is a complaint that carries with it a high risk of complications and morbidity.  The differential diagnosis includes UTI, outflow tract obstruction, prostatitis, bladder stone, nephrolithiasis, diverticulitis  MDM: Patient seen emergency room for evaluation of pelvic pain, urinary urgency and frequency.  Physical exam with mild tenderness to the suprapubic region but is otherwise unremarkable.  PVR 125.  Laboratory evaluation unremarkable including a normal creatinine at 0.85.  Urinalysis is concerning for infection with positive nitrites, large leuk esterase, 11-20 red blood cells, greater than 50 white blood cells.  Patient will be covered with Duricef and was instructed to follow-up outpatient with his urologist.  Urine culture sent and we will tailor antibiotics based on this results in 24 to 48 hours.  On reevaluation, patient remains hemodynamically stable and symptoms improved with pain control.  At this time he does not meet inpatient criteria for admission and is safe for discharge with outpatient follow-up.  Return precautions given of which she voiced understanding.  Additional history obtained:  -External records from outside source obtained and reviewed including: Chart review including previous notes, labs, imaging, consultation notes   Lab Tests: -I ordered, reviewed, and interpreted labs.   The pertinent results include:   Labs Reviewed  CBC WITH DIFFERENTIAL/PLATELET - Abnormal; Notable for the following components:      Result Value   RDW 17.6 (*)    All  other components within normal limits  URINALYSIS, ROUTINE W REFLEX MICROSCOPIC - Abnormal; Notable for the following components:   Color, Urine AMBER (*)    APPearance CLOUDY (*)    Hgb urine dipstick SMALL (*)    Protein, ur 30 (*)    Nitrite POSITIVE (*)    Leukocytes,Ua LARGE (*)    Bacteria, UA FEW (*)    All  other components within normal limits  URINE CULTURE  COMPREHENSIVE METABOLIC PANEL      Medicines ordered and prescription drug management: Meds ordered this encounter  Medications   naproxen (NAPROSYN) tablet 500 mg   cefadroxil (DURICEF) capsule 500 mg    -I have reviewed the patients home medicines and have made adjustments as needed  Critical interventions none    Cardiac Monitoring: The patient was maintained on a cardiac monitor.  I personally viewed and interpreted the cardiac monitored which showed an underlying rhythm of: NSR  Social Determinants of Health:  Factors impacting patients care include: none   Reevaluation: After the interventions noted above, I reevaluated the patient and found that they have :improved  Co morbidities that complicate the patient evaluation  Past Medical History:  Diagnosis Date   Alcohol abuse    history of   Arthritis    Left knee, lower back   Chest pain    hx   COPD (chronic obstructive pulmonary disease) (HCC)    "[place] said I don't have this" (10/26/2016)   Depression    no meds   Diabetes mellitus without complication (HCC)    DVT (deep venous thrombosis) (HCC) 2008   Right leg- txed with coumadin    DVT (deep venous thrombosis) (HCC) 2010; 2017   RLE; RLE   Gastroesophageal reflux disease 2014   Hypertension    MI, old ~ 2007   Pneumonia    Pre-diabetes    Stroke (HCC) 01/2012   denies residual on 10/26/2016   Substance abuse (HCC)    Alcohol dependence Hx   Tobacco abuse       Dispostion: I considered admission for this patient, but at this time he does not meet inpatient criteria for admission he is safe for discharge with outpatient follow-up     Final Clinical Impression(s) / ED Diagnoses Final diagnoses:  None     @PCDICTATION @    Glendora Score, MD 08/17/23 1057

## 2023-08-18 LAB — URINE CULTURE: Culture: >=100000 — AB

## 2023-08-19 ENCOUNTER — Ambulatory Visit: Payer: MEDICAID | Admitting: Gastroenterology

## 2023-08-19 NOTE — Telephone Encounter (Signed)
Post ED Visit - Positive Culture Follow-up  Culture report reviewed by antimicrobial stewardship pharmacist: [facility] []  Enzo Bi, Pharm.D. []  Celedonio Miyamoto, Pharm.D., BCPS AQ-ID []  Garvin Fila, Pharm.D., BCPS []  Georgina Pillion, Pharm.D., BCPS []  [place], [place].D., BCPS, AAHIVP []  Estella Husk, Pharm.D., BCPS, AAHIVP []  Lysle Pearl, PharmD, BCPS []  Phillips Climes, PharmD, BCPS []  Agapito Games, PharmD, BCPS []  Verlan Friends, PharmD []  Mervyn Gay, PharmD, BCPS []  Vinnie Level, PharmD  [facility] [x]  Roslyn Smiling, PharmD []  Greer Pickerel, PharmD []  Adalberto Cole, PharmD []  Perlie Gold, Rph []  Lonell Face) Jean Rosenthal, PharmD []  Earl Many, PharmD []  Junita Push, PharmD []  Dorna Leitz, PharmD []  Terrilee Files, PharmD []  Lynann Beaver, PharmD []  Keturah Barre, PharmD []  Loralee Pacas, PharmD []  Bernadene Person, PharmD   Positive urine culture Treated with cefadroxil, organism sensitive to the same and no further patient follow-up is required at this time.  Nena Polio Garner Nash 08/19/2023, 11:01 AM

## 2023-10-14 ENCOUNTER — Encounter: Payer: Self-pay | Admitting: Pediatrics

## 2023-11-06 ENCOUNTER — Other Ambulatory Visit: Payer: Self-pay | Admitting: Cardiology

## 2023-11-06 DIAGNOSIS — R931 Abnormal findings on diagnostic imaging of heart and coronary circulation: Secondary | ICD-10-CM

## 2023-11-06 DIAGNOSIS — R0602 Shortness of breath: Secondary | ICD-10-CM

## 2023-11-06 DIAGNOSIS — I10 Essential (primary) hypertension: Secondary | ICD-10-CM

## 2023-11-06 DIAGNOSIS — E119 Type 2 diabetes mellitus without complications: Secondary | ICD-10-CM

## 2023-11-27 ENCOUNTER — Other Ambulatory Visit: Payer: Self-pay | Admitting: Cardiology

## 2023-11-27 DIAGNOSIS — R0602 Shortness of breath: Secondary | ICD-10-CM

## 2023-11-27 DIAGNOSIS — I10 Essential (primary) hypertension: Secondary | ICD-10-CM

## 2023-12-19 ENCOUNTER — Other Ambulatory Visit: Payer: Self-pay | Admitting: Cardiology

## 2023-12-19 DIAGNOSIS — R931 Abnormal findings on diagnostic imaging of heart and coronary circulation: Secondary | ICD-10-CM

## 2023-12-19 DIAGNOSIS — E119 Type 2 diabetes mellitus without complications: Secondary | ICD-10-CM

## 2023-12-27 NOTE — Progress Notes (Deleted)
 [facility] Initial Consultation   Referring Provider Brent Contras, MD [address] [place],  [place] [address]  Primary Care Provider Brent Contras, MD  Patient Profile: Brent Byrd. is a 68 y.o. male who is seen in consultation in the [facility] at the request of Dr. Concepcion Byrd for evaluation and management of the problem(s) noted below.  Problem List: History of adenomatous colon polyps   History of Present Illness   Mr. Brent Byrd is a 68 y.o. male with a history of CVA, CAD status post MI, HTN, prediabetes, GERD, DVT on Coumadin, prostate cancer status post TURP.   Last colonoscopy: 02/2016 -one 5 mm TA in the transverse colon, diverticulosis in left and transverse colon Last endoscopy: None  Last Abd CT/CTE/MRE: None  GI Review of Symptoms Significant for {[id number]}. Otherwise negative.  General Review of Systems  Review of systems is significant for the pertinent positives and negatives as listed per the HPI.  Full ROS is otherwise negative.  Past Medical History   Past Medical History:  Diagnosis Date   Alcohol abuse    history of   Arthritis    Left knee, lower back   Chest pain    hx   COPD (chronic obstructive pulmonary disease) (HCC)    "[place] said I don't have this" (10/26/2016)   Depression    no meds   Diabetes mellitus without complication (HCC)    DVT (deep venous thrombosis) (HCC) 2008   Right leg- txed with coumadin    DVT (deep venous thrombosis) (HCC) 2010; 2017   RLE; RLE   Gastroesophageal reflux disease 2014   History of colon polyps 03/10/2016   Hypertension    MI, old ~ 2007   Pneumonia    Pre-diabetes    Stroke (HCC) 01/2012   denies residual on 10/26/2016   Substance abuse (HCC)    Alcohol dependence Hx   Tobacco abuse      Past Surgical History   Past Surgical History:  Procedure Laterality Date   GANGLION CYST EXCISION Left 2000   wrist   HAMMER TOE SURGERY Left 07/2012   2nd  toe/08/05/2012   LEFT HEART CATH AND CORONARY ANGIOGRAPHY N/A 05/19/2022   Procedure: LEFT HEART CATH AND CORONARY ANGIOGRAPHY;  Surgeon: Yates Decamp, MD;  Location: [facility];  Service: Cardiovascular;  Laterality: N/A;   LUMBAR DISC SURGERY  06/2007   Herniated disc/notes 02/25/2011   ORIF ANKLE FRACTURE Right 02/15/2023   Procedure: Right ankle trimalleolar open reduction internal fixation, possible syndesmosis and/or deltoid fixation, possible allograft, possible external fixation;  Surgeon: Netta Cedars, MD;  Location: [facility];  Service: Orthopedics;  Laterality: Right;   TRANSURETHRAL RESECTION OF PROSTATE N/A 10/13/2022   Procedure: TRANSURETHRAL RESECTION OF THE PROSTATE (TURP);  Surgeon: Noel Christmas, MD;  Location: [facility];  Service: Urology;  Laterality: N/A;  90 MINS   WISDOM TOOTH EXTRACTION       Allergies and Medications   Allergies  Allergen Reactions   Iodine Hives and Other (See Comments)   Omnipaque [Iohexol] Other (See Comments)    IV Dye-Sneezing    @MEDSTODAY @  Family History   Family History  Problem Relation Age of Onset   Asthma Mother    Cancer Father        Lung   Cancer Sister    Thrombosis Sister    Diabetes Sister    Colon cancer Neg Hx    Rectal cancer Neg Hx    Stomach cancer  Neg Hx    Esophageal cancer Neg Hx      Social History   Social History   Tobacco Use   Smoking status: Every Day    Current packs/day: 1.00    Average packs/day: 1 pack/day for 45.0 years (45.0 ttl pk-yrs)    Types: Cigarettes   Smokeless tobacco: Never   Tobacco comments:    Pt reports a pack lasts a month  Vaping Use   Vaping status: Never Used  Substance Use Topics   Alcohol use: Not Currently    Alcohol/week: 1.0 standard drink of alcohol    Types: 1 Cans of beer per week   Drug use: Not Currently    Types: "Crack" cocaine, Cocaine   Brent Byrd reports that he has been smoking cigarettes. He has a 45 pack-year smoking history. He has never  used smokeless tobacco. He reports that he does not currently use alcohol after a past usage of about 1.0 standard drink of alcohol per week. He reports that he does not currently use drugs after having used the following drugs: "Crack" cocaine and Cocaine.  Vital Signs and Physical Examination  There were no vitals filed for this visit. There is no height or weight on file to calculate BMI.    General: Well developed, well nourished, no acute distress Head: Normocephalic and atraumatic Eyes: Sclerae anicteric, EOMI Ears: Normal auditory acuity Mouth: No deformities or lesions noted Lungs: Clear throughout to auscultation Heart: Regular rate and rhythm; No murmurs, rubs or bruits Abdomen: Soft, non tender and non distended. No masses, hepatosplenomegaly or hernias noted. Normal Bowel sounds Rectal: Musculoskeletal: Symmetrical with no gross deformities  Pulses:  Normal pulses noted Extremities: No edema or deformities noted Neurological: Alert oriented x 4, grossly nonfocal Psychological:  Alert and cooperative. Normal mood and affect  Review of Data  The following data was reviewed at the time of this encounter:  Laboratory Studies      Latest Ref Rng & Units 08/16/2023    1:14 PM 02/17/2023    3:42 AM 02/16/2023    4:19 AM  CBC  WBC 4.0 - 10.5 K/uL 4.6  6.2  7.1   Hemoglobin 13.0 - 17.0 g/dL 16.1  09.6  04.5   Hematocrit 39.0 - 52.0 % 41.2  36.1  36.0   Platelets 150 - 400 K/uL 209  270  250     No results found for: "LIPASE"    Latest Ref Rng & Units 08/16/2023    1:14 PM 02/16/2023    4:19 AM 02/12/2023    9:12 AM  CMP  Glucose 70 - 99 mg/dL 92  409  811   BUN 8 - 23 mg/dL 9  15  12    Creatinine 0.61 - 1.24 mg/dL 9.14  7.82  9.56   Sodium 135 - 145 mmol/L 139  134  131   Potassium 3.5 - 5.1 mmol/L 3.8  4.5  4.0   Chloride 98 - 111 mmol/L 105  101  99   CO2 22 - 32 mmol/L 24  24  22    Calcium 8.9 - 10.3 mg/dL 9.1  8.3  9.0   Total Protein 6.5 - 8.1 g/dL 7.9      Total Bilirubin 0.3 - 1.2 mg/dL 0.7     Alkaline Phos 38 - 126 U/L 72     AST 15 - 41 U/L 19     ALT 0 - 44 U/L 20        Imaging Studies  GI Procedures and Studies      Clinical Impression  It is my clinical impression that Mr. Brent Byrd is a 68 y.o. male with;  ***  Plan  *** *** *** *** ***  Planned Follow Up No follow-ups on file.  The patient or caregiver verbalized understanding of the material covered, with no barriers to understanding. All questions were answered. Patient or caregiver is agreeable with the plan outlined above.    It was a pleasure to see Kishaun.  If you have any questions or concerns regarding this evaluation, do not hesitate to contact me.  Maren Beach, MD [facility]

## 2023-12-29 ENCOUNTER — Ambulatory Visit: Payer: No Typology Code available for payment source | Admitting: Pediatrics

## 2023-12-29 DIAGNOSIS — Z860100 Personal history of colon polyps, unspecified: Secondary | ICD-10-CM

## 2024-01-03 ENCOUNTER — Other Ambulatory Visit: Payer: Self-pay | Admitting: Cardiology

## 2024-01-03 DIAGNOSIS — R931 Abnormal findings on diagnostic imaging of heart and coronary circulation: Secondary | ICD-10-CM

## 2024-01-03 DIAGNOSIS — E119 Type 2 diabetes mellitus without complications: Secondary | ICD-10-CM

## 2024-08-13 ENCOUNTER — Emergency Department (HOSPITAL_COMMUNITY)
Admission: EM | Admit: 2024-08-13 | Discharge: 2024-08-13 | Disposition: A | Discharge status: Home / Self Care | Attending: Emergency Medicine | Admitting: Emergency Medicine

## 2024-08-13 ENCOUNTER — Emergency Department (HOSPITAL_COMMUNITY)

## 2024-08-13 ENCOUNTER — Other Ambulatory Visit: Payer: Self-pay

## 2024-08-13 DIAGNOSIS — J449 Chronic obstructive pulmonary disease, unspecified: Secondary | ICD-10-CM | POA: Diagnosis not present

## 2024-08-13 DIAGNOSIS — Z7984 Long term (current) use of oral hypoglycemic drugs: Secondary | ICD-10-CM | POA: Insufficient documentation

## 2024-08-13 DIAGNOSIS — J439 Emphysema, unspecified: Secondary | ICD-10-CM | POA: Insufficient documentation

## 2024-08-13 DIAGNOSIS — F10920 Alcohol use, unspecified with intoxication, uncomplicated: Secondary | ICD-10-CM | POA: Insufficient documentation

## 2024-08-13 DIAGNOSIS — Z79899 Other long term (current) drug therapy: Secondary | ICD-10-CM | POA: Diagnosis not present

## 2024-08-13 DIAGNOSIS — Z7982 Long term (current) use of aspirin: Secondary | ICD-10-CM | POA: Insufficient documentation

## 2024-08-13 DIAGNOSIS — F17210 Nicotine dependence, cigarettes, uncomplicated: Secondary | ICD-10-CM | POA: Insufficient documentation

## 2024-08-13 DIAGNOSIS — R519 Headache, unspecified: Secondary | ICD-10-CM | POA: Diagnosis not present

## 2024-08-13 DIAGNOSIS — E119 Type 2 diabetes mellitus without complications: Secondary | ICD-10-CM | POA: Diagnosis not present

## 2024-08-13 DIAGNOSIS — Z7901 Long term (current) use of anticoagulants: Secondary | ICD-10-CM | POA: Diagnosis not present

## 2024-08-13 DIAGNOSIS — W19XXXA Unspecified fall, initial encounter: Secondary | ICD-10-CM | POA: Diagnosis not present

## 2024-08-13 DIAGNOSIS — G8324 Monoplegia of upper limb affecting left nondominant side: Secondary | ICD-10-CM | POA: Diagnosis present

## 2024-08-13 DIAGNOSIS — I10 Essential (primary) hypertension: Secondary | ICD-10-CM | POA: Insufficient documentation

## 2024-08-13 DIAGNOSIS — S0990XA Unspecified injury of head, initial encounter: Secondary | ICD-10-CM | POA: Diagnosis present

## 2024-08-13 LAB — TROPONIN I (HIGH SENSITIVITY)
Troponin I (High Sensitivity): 7 ng/L (ref <18)
Troponin I (High Sensitivity): 7 ng/L (ref <18)

## 2024-08-13 LAB — CBC
HCT: 43.5 % (ref 39.0–52.0)
Hemoglobin: 14.4 g/dL (ref 13.0–17.0)
MCH: 31.4 pg (ref 26.0–34.0)
MCHC: 33.1 g/dL (ref 30.0–36.0)
MCV: 95 fL (ref 80.0–100.0)
Platelets: 234 K/uL (ref 150–400)
RBC: 4.58 MIL/uL (ref 4.22–5.81)
RDW: 15.1 % (ref 11.5–15.5)
WBC: 4.1 K/uL (ref 4.0–10.5)
nRBC: 0 % (ref 0.0–0.2)

## 2024-08-13 LAB — I-STAT CHEM 8, ED
BUN: 7 mg/dL — ABNORMAL LOW (ref 8–23)
Calcium, Ion: 1.06 mmol/L — ABNORMAL LOW (ref 1.15–1.40)
Chloride: 106 mmol/L (ref 98–111)
Creatinine, Ser: 1.1 mg/dL (ref 0.61–1.24)
Glucose, Bld: 80 mg/dL (ref 70–99)
HCT: 44 % (ref 39.0–52.0)
Hemoglobin: 15 g/dL (ref 13.0–17.0)
Potassium: 4.1 mmol/L (ref 3.5–5.1)
Sodium: 143 mmol/L (ref 135–145)
TCO2: 24 mmol/L (ref 22–32)

## 2024-08-13 LAB — COMPREHENSIVE METABOLIC PANEL WITH GFR
ALT: 18 U/L (ref 0–44)
AST: 22 U/L (ref 15–41)
Albumin: 3.6 g/dL (ref 3.5–5.0)
Alkaline Phosphatase: 70 U/L (ref 38–126)
Anion gap: 13 (ref 5–15)
BUN: 6 mg/dL — ABNORMAL LOW (ref 8–23)
CO2: 23 mmol/L (ref 22–32)
Calcium: 8.6 mg/dL — ABNORMAL LOW (ref 8.9–10.3)
Chloride: 104 mmol/L (ref 98–111)
Creatinine, Ser: 0.82 mg/dL (ref 0.61–1.24)
GFR, Estimated: >60 mL/min (ref >60)
Glucose, Bld: 83 mg/dL (ref 70–99)
Potassium: 3.9 mmol/L (ref 3.5–5.1)
Sodium: 140 mmol/L (ref 135–145)
Total Bilirubin: 0.3 mg/dL (ref 0.0–1.2)
Total Protein: 7.5 g/dL (ref 6.5–8.1)

## 2024-08-13 LAB — PROTIME-INR
INR: 1 (ref 0.8–1.2)
Prothrombin Time: 13.3 s (ref 11.4–15.2)

## 2024-08-13 LAB — SAMPLE TO BLOOD BANK

## 2024-08-13 LAB — ETHANOL: Alcohol, Ethyl (B): 241 mg/dL — ABNORMAL HIGH (ref <15)

## 2024-08-13 NOTE — Progress Notes (Signed)
 Orthopedic Tech Progress Note Patient Details:  Brent Byrd 11-12-1955 [id number]  Level 2 trauma   Patient ID: Brent Leafy Raddle., male   DOB: 1956/08/27, 68 y.o.   MRN: [id number]  Brent Byrd 08/13/2024, 2:01 AM

## 2024-08-13 NOTE — ED Triage Notes (Signed)
 Patient arrived with EMS wearing C- collar from home , found on the floor by family this evening unknown downtime , he takes Eliquis  , left arm weakness/drift onset yesterday per patient . + ETOH . CBG=100. Denies pain / respirations unlabored .

## 2024-08-13 NOTE — ED Notes (Signed)
 Pt states he is ready to go, denies pain or dizziness. Pt ambulatory in hallway, MD aware.

## 2024-08-13 NOTE — ED Provider Notes (Signed)
 [place] EMERGENCY DEPARTMENT AT [facility] Provider Note  CSN: [id number] Arrival date & time: 08/13/24 0133  Chief Complaint(s) Fall ( Eliquis )   Patient arrived with EMS wearing C- collar from home , found on the floor by family this evening unknown downtime , he takes Eliquis  , left arm weakness/drift onset yesterday per patient . + ETOH . CBG=100. Denies pain / respirations unlabored .   HPI Brent Byrd. is a 68 y.o. male here after a fall with head trauma.  Positive EtOH.  Patient does not remember the fall.  Reports that he woke up with his friends picking him up off the ground.  EMS was called out initially for a possible low blood sugar episode.  Patient CBG was 100.  EMS did not find that he was on Eliquis  until pulled in to the bay.  Given the fall on thinners, he was upgraded to a level 2 fall.  Patient is complaining of occipital head pain and left chest wall pain.  He has baseline left arm weakness due to prior stroke.  The history is provided by the EMS personnel.    Past Medical History Past Medical History:  Diagnosis Date   Alcohol abuse    history of   Arthritis    Left knee, lower back   Chest pain    hx   COPD (chronic obstructive pulmonary disease) (HCC)    [place] said I don't have this (10/26/2016)   Depression    no meds   Diabetes mellitus without complication (HCC)    DVT (deep venous thrombosis) (HCC) 2008   Right leg- txed with coumadin    DVT (deep venous thrombosis) (HCC) 2010; 2017   RLE; RLE   Gastroesophageal reflux disease 2014   History of colon polyps 03/10/2016   Hypertension    MI, old ~ 2007   Pneumonia    Pre-diabetes    Stroke (HCC) 01/2012   denies residual on 10/26/2016   Substance abuse (HCC)    Alcohol dependence Hx   Tobacco abuse    Patient Active Problem List   Diagnosis Date Noted   Postoperative state 02/15/2023   BPH with obstruction/lower urinary tract symptoms 10/13/2022   Abnormal nuclear  stress test    Headache syndrome 07/24/2020   PNA (pneumonia) 01/04/2020   COVID-19 01/04/2020   Community acquired pneumonia 10/25/2016   Acute respiratory failure with hypoxia (HCC) 10/25/2016   Rhinitis, non-allergic 10/25/2016   Dyspnea on exertion 04/13/2016   Hypotension 04/13/2016   Acute respiratory distress 04/13/2016   Substance abuse (HCC)    Chest pain    COPD (chronic obstructive pulmonary disease) (HCC)    Gastroesophageal reflux disease    Hypertension    Depression    Tobacco abuse    Alcohol abuse    Cigarette smoker 05/18/2015   Asthmatic bronchitis with acute exacerbation 05/17/2015   Home Medication(s) Prior to Admission medications   Medication Sig Start Date End Date Taking? Authorizing Provider  acetaminophen  (TYLENOL ) 500 MG tablet Take 1 tablet (500 mg total) by mouth every 6 (six) hours as needed. 02/07/23   Redwine, Madison A, PA-C  albuterol  (PROVENTIL ) (2.5 MG/3ML) 0.083% nebulizer solution Take 2.5 mg by nebulization every 6 (six) hours as needed for wheezing or shortness of breath. 08/25/22   [provider]  albuterol  (VENTOLIN  HFA) 108 (90 Base) MCG/ACT inhaler Inhale 1-2 puffs into the lungs every 6 (six) hours as needed for wheezing or shortness of breath.  [provider]  amLODipine  (NORVASC ) 5 MG tablet Take 1 tablet (5 mg total) by mouth daily. 03/05/23   Custovic, Sabina, DO  aspirin  EC 81 MG tablet Take 1 tablet (81 mg total) by mouth daily. Swallow whole. 03/05/23   Custovic, Sabina, DO  Budeson-Glycopyrrol-Formoterol  (BREZTRI AEROSPHERE) 160-9-4.8 MCG/ACT AERO Inhale 2 puffs into the lungs 2 (two) times daily.    [provider]  cetirizine (ZYRTEC) 10 MG tablet Take 10 mg by mouth daily as needed for allergies. 11/10/22   [provider]  diclofenac Sodium (VOLTAREN ARTHRITIS PAIN) 1 % GEL Apply 2 g topically 4 (four) times daily as needed (pain).    [provider]  ELIQUIS  5 MG TABS tablet Take 1  tablet (5 mg total) by mouth 2 (two) times daily. 03/05/23   Custovic, Sabina, DO  fluticasone  (FLONASE ) 50 MCG/ACT nasal spray Place 2 sprays into both nostrils daily as needed for allergies. 11/10/22   [provider]  hydrochlorothiazide  (MICROZIDE ) 12.5 MG capsule TAKE 1 CAPSULE BY MOUTH EVERY DAY 11/30/23   Tolia, Sunit, DO  JARDIANCE 10 MG TABS tablet Take 10 mg by mouth daily. 10/13/22   [provider]  metFORMIN  (GLUCOPHAGE ) 500 MG tablet Take 500 mg by mouth 2 (two) times daily with a meal.    [provider]  nitroGLYCERIN  (NITROSTAT ) 0.4 MG SL tablet Place 1 tablet (0.4 mg total) under the tongue every 5 (five) minutes as needed for chest pain. If you require more than two tablets five minutes apart go to the nearest ER via EMS. 03/05/23 04/04/23  Custovic, Sabina, DO  omeprazole (PRILOSEC) 20 MG capsule Take 20 mg by mouth daily.    [provider]  rosuvastatin  (CRESTOR ) 20 MG tablet TAKE 1 TABLET BY MOUTH EVERYDAY AT BEDTIME 12/20/23   Tolia, Sunit, DO  Vitamin D, Ergocalciferol, (DRISDOL) 1.25 MG (50000 UNIT) CAPS capsule Take 50,000 Units by mouth once a week. 12/24/22   [provider]  famotidine  (PEPCID ) 20 MG tablet Take 1 tablet (20 mg total) by mouth 2 (two) times daily for 10 days. 01/08/20 08/22/20  Elgergawy, Brayton RAMAN, MD                                                                                                                                    Allergies Iodine and Omnipaque  [iohexol ]  Review of Systems Review of Systems As noted in HPI  Physical Exam Vital Signs  I have reviewed the triage vital signs BP 103/76 (BP Location: Right Arm)   Pulse 84   Temp 97.9 F (36.6 C) (Oral)   Resp 14   SpO2 93%   Physical Exam Constitutional:      General: He is not in acute distress.    Appearance: He is well-developed. He is not diaphoretic.     Interventions: Cervical collar in place.  HENT:     Head: Normocephalic.  Right  Ear: External ear normal.     Left Ear: External ear normal.  Eyes:     General: No scleral icterus.       Right eye: No discharge.        Left eye: No discharge.     Conjunctiva/sclera: Conjunctivae normal.     Pupils: Pupils are equal, round, and reactive to light.  Cardiovascular:     Rate and Rhythm: Regular rhythm.     Pulses:          Radial pulses are 2+ on the right side and 2+ on the left side.       Dorsalis pedis pulses are 2+ on the right side and 2+ on the left side.     Heart sounds: Normal heart sounds. No murmur heard.    No friction rub. No gallop.  Pulmonary:     Effort: Pulmonary effort is normal. No respiratory distress.     Breath sounds: Normal breath sounds. No stridor.  Chest:     Chest wall: Tenderness present.    Abdominal:     General: There is no distension.     Palpations: Abdomen is soft.     Tenderness: There is no abdominal tenderness.  Musculoskeletal:     Cervical back: Normal range of motion and neck supple. No bony tenderness.     Thoracic back: No bony tenderness.     Lumbar back: No bony tenderness.     Comments: Clavicle stable. Chest stable to AP/Lat compression. Pelvis stable to Lat compression. No obvious extremity deformity. No chest or abdominal wall contusion.  Skin:    General: Skin is warm.  Neurological:     Mental Status: He is alert and oriented to person, place, and time.     GCS: GCS eye subscore is 4. GCS verbal subscore is 5. GCS motor subscore is 6.     Comments: Moving all extremities      ED Results and Treatments Labs (all labs ordered are listed, but only abnormal results are displayed) Labs Reviewed  COMPREHENSIVE METABOLIC PANEL WITH GFR - Abnormal; Notable for the following components:      Result Value   BUN 6 (*)    Calcium  8.6 (*)    All other components within normal limits  ETHANOL - Abnormal; Notable for the following components:   Alcohol, Ethyl (B) 241 (*)    All other components within  normal limits  I-STAT CHEM 8, ED - Abnormal; Notable for the following components:   BUN 7 (*)    Calcium , Ion 1.06 (*)    All other components within normal limits  CBC  PROTIME-INR  SAMPLE TO BLOOD BANK  TROPONIN I (HIGH SENSITIVITY)  TROPONIN I (HIGH SENSITIVITY)                                                                                                                         EKG   Radiology CT HEAD WO CONTRAST Result  Date: 08/13/2024 EXAM: CT HEAD AND CERVICAL SPINE 08/13/2024 02:30:37 AM TECHNIQUE: CT of the head and cervical spine was performed without the administration of intravenous contrast. Multiplanar reformatted images are provided for review. Automated exposure control, iterative reconstruction, and/or weight based adjustment of the mA/kV was utilized to reduce the radiation dose to as low as reasonably achievable. COMPARISON: Severe and c-spine 05/09/2020. CLINICAL HISTORY: Head trauma, moderate-severe. Patient arrived with EMS wearing C-collar from home, found on the floor by family this evening unknown downtime, he takes Eliquis , left arm weakness/drift onset yesterday per patient. + ETOH. FINDINGS: CT HEAD BRAIN AND VENTRICLES: Atherosclerotic calcifications are present within the cavernous internal carotid arteries. No acute intracranial hemorrhage. No mass effect or midline shift. No abnormal extra-axial fluid collection. No evidence of acute infarct. No hydrocephalus. ORBITS: No acute abnormality. SINUSES AND MASTOIDS: Bilateral maxillary sinus mucosal thickening. No acute abnormality of the mastoids. SOFT TISSUES AND SKULL: Emphysematous changes. No acute skull fracture. CT CERVICAL SPINE BONES AND ALIGNMENT: No acute fracture or traumatic malalignment. DEGENERATIVE CHANGES: Multilevel anterior osteophyte formation. Multilevel moderate degenerative changes of the spine. No severe osseous neural foraminal or central canal stenosis. SOFT TISSUES: No prevertebral soft tissue  swelling. IMPRESSION: 1. No acute intracranial abnormality. 2. No acute fracture or traumatic malalignment of the cervical spine. 3. Emphysema. Electronically signed by: Morgane Naveau MD 08/13/2024 02:42 AM EDT RP Workstation: [id number]   CT CERVICAL SPINE WO CONTRAST Result Date: 08/13/2024 EXAM: CT HEAD AND CERVICAL SPINE 08/13/2024 02:30:37 AM TECHNIQUE: CT of the head and cervical spine was performed without the administration of intravenous contrast. Multiplanar reformatted images are provided for review. Automated exposure control, iterative reconstruction, and/or weight based adjustment of the mA/kV was utilized to reduce the radiation dose to as low as reasonably achievable. COMPARISON: Severe and c-spine 05/09/2020. CLINICAL HISTORY: Head trauma, moderate-severe. Patient arrived with EMS wearing C-collar from home, found on the floor by family this evening unknown downtime, he takes Eliquis , left arm weakness/drift onset yesterday per patient. + ETOH. FINDINGS: CT HEAD BRAIN AND VENTRICLES: Atherosclerotic calcifications are present within the cavernous internal carotid arteries. No acute intracranial hemorrhage. No mass effect or midline shift. No abnormal extra-axial fluid collection. No evidence of acute infarct. No hydrocephalus. ORBITS: No acute abnormality. SINUSES AND MASTOIDS: Bilateral maxillary sinus mucosal thickening. No acute abnormality of the mastoids. SOFT TISSUES AND SKULL: Emphysematous changes. No acute skull fracture. CT CERVICAL SPINE BONES AND ALIGNMENT: No acute fracture or traumatic malalignment. DEGENERATIVE CHANGES: Multilevel anterior osteophyte formation. Multilevel moderate degenerative changes of the spine. No severe osseous neural foraminal or central canal stenosis. SOFT TISSUES: No prevertebral soft tissue swelling. IMPRESSION: 1. No acute intracranial abnormality. 2. No acute fracture or traumatic malalignment of the cervical spine. 3. Emphysema. Electronically signed  by: Morgane Naveau MD 08/13/2024 02:42 AM EDT RP Workstation: [id number]   DG Chest Port 1 View Result Date: 08/13/2024 EXAM: 1 VIEW(S) XRAY OF THE CHEST 08/13/2024 01:47:25 AM COMPARISON: Comparison with x-ray 10/25/2020. CLINICAL HISTORY: Trauma. EMS wearing C-collar from home, found on the floor by family this evening unknown downtime, he takes Eliquis , left arm weakness/drift onset yesterday per patient. + ETOH. FINDINGS: LUNGS AND PLEURA: Chronic bronchitic changes. No significant change from prior. No pleural effusion. No pneumothorax. HEART AND MEDIASTINUM: Stable cardiomediastinal silhouette. BONES AND SOFT TISSUES: No acute osseous abnormality. IMPRESSION: 1. No acute cardiopulmonary process. Electronically signed by: Norman Gatlin MD 08/13/2024 01:51 AM EDT RP Workstation: [id number]   DG Pelvis Portable Result Date: 08/13/2024 EXAM:  1 or 2 VIEW(S) XRAY OF THE PELVIS 08/13/2024 01:46:20 AM COMPARISON: None available. CLINICAL HISTORY: Trauma. EMS wearing C- collar from home , found on the floor by family this evening unknown downtime , he takes Eliquis  , left arm weakness/drift onset yesterday per patient. + ETOH FINDINGS: BONES AND JOINTS: No acute fracture or dislocation. No focal osseous lesion. Degenerative changes in both hips, pubic symphysis, sacroiliac joints, and lower lumbar spine. SOFT TISSUES: The soft tissues are unremarkable. IMPRESSION: 1. No acute fracture or dislocation. Electronically signed by: Norman Gatlin MD 08/13/2024 01:49 AM EDT RP Workstation: [id number]    Medications Ordered in ED Medications - No data to display Procedures Procedures  (including critical care time) Medical Decision Making / ED Course   Medical Decision Making Amount and/or Complexity of Data Reviewed Labs: ordered. Decision-making details documented in ED Course. Radiology: ordered and independent interpretation performed. Decision-making details documented in ED Course. ECG/medicine  tests: ordered and independent interpretation performed. Decision-making details documented in ED Course.     Clinical Course as of 08/13/24 9547  [place] Aug 13, 2024  0159 Intoxicated patient here for fall with positive head trauma on blood thinners.  Upgraded to level 2 trauma ABCs intact Secondary as above  Targeted trauma workup obtain Will also obtain labs to assess for any metabolic derangements.  Chest pain is most suspicious for chest wall pain due to the tenderness reported fall.  Will get cardiac markers to rule out any primary cardiac pathology.  [PC]  0326 CBC without leukocytosis or anemia. CMP without significant electrolyte derangements or renal sufficiency  EtOH of 240  EKG without acute ischemic changes, dysrhythmias or blocks.  Initial troponin negative.  X-rays of the chest and pelvis negative. CT of the head and cervical spine negative. [PC]  0446 Delta trop negative  [PC]  0449 Wife and family here. Patient sober enough to ambulate well and be discharged to their care [PC]    Clinical Course User Index [PC] Bobetta Korf, Raynell Moder, MD    Final Clinical Impression(s) / ED Diagnoses Final diagnoses:  Alcoholic intoxication without complication  Fall, initial encounter   The patient appears reasonably screened and/or stabilized for discharge and I doubt any other medical condition or other [facility] requiring further screening, evaluation, or treatment in the ED at this time. I have discussed the findings, Dx and Tx plan with the patient/family who expressed understanding and agree(s) with the plan. Discharge instructions discussed at length. The patient/family was given strict return precautions who verbalized understanding of the instructions. No further questions at time of discharge.  Disposition: Discharge  Condition: Good  ED Discharge Orders     None         Follow Up: Shelda Atlas, MD [address] [place]  [address] [phone]  Call  to schedule an appointment for close follow up    This chart was dictated using voice recognition software.  Despite best efforts to proofread,  errors can occur which can change the documentation meaning.    Trine Raynell Moder, MD 08/13/24 [phone]
